# Patient Record
Sex: Female | Born: 1990 | Race: White | Hispanic: No | Marital: Married | Smoking: Never smoker
Health system: Southern US, Community
[De-identification: ages and names within clinical notes are randomized; demographics above are authoritative.]

## PROBLEM LIST (undated history)

## (undated) ENCOUNTER — Ambulatory Visit: Admission: EM | Payer: BC Managed Care – PPO

## (undated) DIAGNOSIS — O24419 Gestational diabetes mellitus in pregnancy, unspecified control: Secondary | ICD-10-CM

## (undated) DIAGNOSIS — T7840XA Allergy, unspecified, initial encounter: Secondary | ICD-10-CM

## (undated) DIAGNOSIS — D649 Anemia, unspecified: Secondary | ICD-10-CM

## (undated) DIAGNOSIS — H53009 Unspecified amblyopia, unspecified eye: Secondary | ICD-10-CM

## (undated) DIAGNOSIS — F419 Anxiety disorder, unspecified: Secondary | ICD-10-CM

## (undated) DIAGNOSIS — F32A Depression, unspecified: Secondary | ICD-10-CM

## (undated) DIAGNOSIS — Z8669 Personal history of other diseases of the nervous system and sense organs: Secondary | ICD-10-CM

## (undated) DIAGNOSIS — L709 Acne, unspecified: Secondary | ICD-10-CM

## (undated) DIAGNOSIS — G43909 Migraine, unspecified, not intractable, without status migrainosus: Secondary | ICD-10-CM

## (undated) DIAGNOSIS — Z87442 Personal history of urinary calculi: Secondary | ICD-10-CM

## (undated) DIAGNOSIS — F329 Major depressive disorder, single episode, unspecified: Secondary | ICD-10-CM

## (undated) DIAGNOSIS — N200 Calculus of kidney: Secondary | ICD-10-CM

## (undated) HISTORY — DX: Depression, unspecified: F32.A

## (undated) HISTORY — PX: WISDOM TOOTH EXTRACTION: SHX21

## (undated) HISTORY — DX: Acne, unspecified: L70.9

## (undated) HISTORY — DX: Anemia, unspecified: D64.9

## (undated) HISTORY — DX: Gestational diabetes mellitus in pregnancy, unspecified control: O24.419

## (undated) HISTORY — DX: Major depressive disorder, single episode, unspecified: F32.9

## (undated) HISTORY — DX: Allergy, unspecified, initial encounter: T78.40XA

## (undated) HISTORY — DX: Unspecified amblyopia, unspecified eye: H53.009

## (undated) HISTORY — DX: Migraine, unspecified, not intractable, without status migrainosus: G43.909

---
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
## 2011-01-07 NOTE — Consult Note (Signed)
Summary: [facility]  [facility]   Imported By: Maryln Gottron 01/01/2010 10:00:07  _____________________________________________________________________  External Attachment:    Type:   Image     Comment:   External Document

## 2011-02-17 ENCOUNTER — Telehealth: Payer: Self-pay | Admitting: Internal Medicine

## 2011-02-17 ENCOUNTER — Encounter: Payer: Self-pay | Admitting: Internal Medicine

## 2011-02-17 ENCOUNTER — Ambulatory Visit (INDEPENDENT_AMBULATORY_CARE_PROVIDER_SITE_OTHER): Payer: 59 | Admitting: Internal Medicine

## 2011-02-17 VITALS — BP 110/70 | HR 92 | Temp 97.2°F | Wt 144.0 lb

## 2011-02-17 DIAGNOSIS — J040 Acute laryngitis: Secondary | ICD-10-CM

## 2011-02-17 DIAGNOSIS — R059 Cough, unspecified: Secondary | ICD-10-CM

## 2011-02-17 DIAGNOSIS — R05 Cough: Secondary | ICD-10-CM

## 2011-02-17 MED ORDER — AZITHROMYCIN 250 MG PO TABS: 250.0000 mg | ORAL_TABLET | Freq: Every day | ORAL | Status: DC

## 2011-02-17 NOTE — Patient Instructions (Addendum)
take antibioitc  As directed  May return  to work on Monday after antibiotic  Schedule check up for forms and pre college physical

## 2011-02-17 NOTE — Telephone Encounter (Signed)
Mom calls in today stating that her daughter has a fever and congestion. Mom was treated for pertussis this Sunday. Please advise.

## 2011-02-17 NOTE — Telephone Encounter (Signed)
Pt works at a day care  With fever (?) sore throat and coughing  Mom was diagnosed with Pertussis.  Wants her seen today.Not feeling well x 24 hours.  Day care sent her home. [phone]

## 2011-02-17 NOTE — Telephone Encounter (Signed)
Per Dr. Fabian Sharp- Have pt come in now. Mom aware and will come in to check her in. She will tell the pt to come straight on back and not check in up front.

## 2011-02-18 ENCOUNTER — Encounter: Payer: Self-pay | Admitting: Internal Medicine

## 2011-02-18 NOTE — Progress Notes (Signed)
  Subjective:    Patient ID: Cheryl Galvan, female    DOB: 1991/05/24, 20 y.o.   MRN: [id number]  HPI Patient comes in today asking for an acute work in appointment by her mother's call because she had the onset of a sore throat laryngitis hoarseness and some coughing.   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 Her mother states that she was treated personally for pertussis by Dr. Merla Riches. I spoke with mother personally on the phone and she stated that she had a coughing illness that began almost 2 months ago and went through a couple rounds of antibiotics. Because her cough with so spasmodic Dr. Merla Riches treated her with azithromycin for possible pertussis. There was no culture or confirmatory test done. 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   [place] became sick over the last couple days with no associated fever she is not that sick but the daycare did send her home.  She does not smoke and has no hacking cough. [place] appears to be up to date on her tdap   She also asks about filling out a form for college immunization as she plans to transfer from [employer] to you when [employer] next semester. Review of Systems No chest pain shortness of breath significant headache nausea vomiting diarrhea G.I. GU symptoms or brash. She has no current asthma flare and has not taken a medicine for this in quite a while.    Objective:   Physical Exam This is a well-developed well-nourished healthy appearing young adult in no acute distress with some moderate hoarseness.  HEENT: Normocephalic ;atraumatic , Eyes;  PERRL, EOMs  Full, lids and conjunctiva clear,,Ears: no deformities, canals nl, TM landmarks normal, Nose: no deformity  There is mild congestion no discharge seen Mouth : OP clear without lesion or edema . Neck: no masses or adenopathy Chest:  Clear to A&P without wheezes rales or  rhonchi CV:  S1-S2 no gallops or murmurs peripheral perfusion is normal Abdomen:  Sof,t normal bowel sounds without hepatosplenomegaly, no guarding rebound or masses no CVA tenderness  LN: no cervical adenopathy   Assessment & Plan:  Cough and laryngitis  Appears to be viral but because of moms reported severe illness and the facgt that she works in day care we will empirically rx with azithro and note for work Will have her come back for wellness and visit for college matriculation

## 2011-04-21 ENCOUNTER — Ambulatory Visit (INDEPENDENT_AMBULATORY_CARE_PROVIDER_SITE_OTHER): Payer: 59 | Admitting: Internal Medicine

## 2011-04-21 ENCOUNTER — Encounter: Payer: Self-pay | Admitting: Internal Medicine

## 2011-04-21 VITALS — BP 120/80 | HR 78 | Ht 66.0 in | Wt 146.0 lb

## 2011-04-21 DIAGNOSIS — Z23 Encounter for immunization: Secondary | ICD-10-CM

## 2011-04-21 DIAGNOSIS — J45909 Unspecified asthma, uncomplicated: Secondary | ICD-10-CM

## 2011-04-21 DIAGNOSIS — Z0000 Encounter for general adult medical examination without abnormal findings: Secondary | ICD-10-CM | POA: Insufficient documentation

## 2011-04-21 MED ORDER — ALBUTEROL SULFATE HFA 108 (90 BASE) MCG/ACT IN AERS: INHALATION_SPRAY | RESPIRATORY_TRACT | Status: DC

## 2011-04-21 NOTE — Patient Instructions (Signed)
Try inhaler pre exercise  If needed. Can do anemia check HG test at next  Shot scheduled.

## 2011-04-21 NOTE — Progress Notes (Signed)
  Subjective:    Patient ID: Cheryl Galvan, female    DOB: 17-Jun-1991, 20 y.o.   MRN: [id number]  HPI Patient comes in today for a checkup and immunization form that is needed for entry into [facility]. Since her last visit she has had no major changes in her health status. Asthma  : When younger. However she has taken up running again and has had some problem with running recently. Heaviness or burning in chest question cough. Neg fam hx of heart disease.  OCps per   Beth lane  who checks her once a year.  She is doing well with her periods at this point. No major injuries or hospitalizations. 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 Review of Systems 12 system review negative except as above  Past history family history social history reviewed in the electronic medical record.     Objective:   Physical Exam Physical Exam: Vital signs reviewed WUJ:WJXB is a well-developed well-nourished alert cooperative  white female who appears her stated age in no acute distress.  HEENT: normocephalic  traumatic , Eyes: PERRL EOM's full, conjunctiva clear, Nares: paten,t no deformity discharge or tenderness., Ears: no deformity EAC's clear TMs with normal landmarks. Mouth: clear OP, no lesions, edema.  Moist mucous membranes. Dentition in adequate repair. NECK: supple without masses, thyromegaly or bruits. CHEST/PULM:  Clear to auscultation and percussion breath sounds equal no wheeze , rales or rhonchi. No chest wall deformities or tenderness. CV: PMI is nondisplaced, S1 S2 no gallops, murmurs, rubs. Peripheral pulses are full without delay.No JVD .  ABDOMEN: Bowel sounds normal nontender  No guard or rebound, no hepato splenomegal no CVA tenderness.  No hernia. Extremtities:  No clubbing cyanosis or edema, no acute joint swelling or redness no focal atrophy Back    Minimal scoliosis   Ortho screen negative  NEURO:   Oriented x3, cranial nerves 3-12 appear to be intact, no obvious focal weakness,gait within normal limits no abnormal reflexes or asymmetrical SKIN: No acute rashes normal turgor, color, no bruising or petechiae. PSYCH: Oriented, good eye contact, no obvious depression anxiety, cognition and judgment appear normal. Breast: normal by inspection . No dimpling, discharge, masses, tenderness or discharge . LN: no cervical axillary inguinal adenopathy Offered lab    Declined may get hg at next immuniz Had labs done a few years ago at [facility].        Assessment & Plan:  Preventive Health Care Counseled regarding healthy nutrition, exercise, sleep, injury prevention, calcium vit d and healthy weight . Recommended immunizations discussed and explained. Questions answered.  No evidence of hep b so give a b series  Hx of asthma : poss EIA      Disc  options will give rx inhaler and disc use   Pre exercise .  To check  If progressive sx.

## 2011-04-21 NOTE — Assessment & Plan Note (Signed)
Possible exercise-induced symptoms recently discussed use of inhaler before exercise as a trial.

## 2011-05-24 ENCOUNTER — Ambulatory Visit (INDEPENDENT_AMBULATORY_CARE_PROVIDER_SITE_OTHER): Payer: 59 | Admitting: Internal Medicine

## 2011-05-24 DIAGNOSIS — Z23 Encounter for immunization: Secondary | ICD-10-CM

## 2011-10-27 ENCOUNTER — Ambulatory Visit (INDEPENDENT_AMBULATORY_CARE_PROVIDER_SITE_OTHER): Payer: 59 | Admitting: Internal Medicine

## 2011-10-27 DIAGNOSIS — Z23 Encounter for immunization: Secondary | ICD-10-CM

## 2012-05-20 ENCOUNTER — Ambulatory Visit (INDEPENDENT_AMBULATORY_CARE_PROVIDER_SITE_OTHER): Payer: 59 | Admitting: Internal Medicine

## 2012-05-20 ENCOUNTER — Ambulatory Visit: Payer: 59

## 2012-05-20 ENCOUNTER — Other Ambulatory Visit: Payer: Self-pay | Admitting: Internal Medicine

## 2012-05-20 VITALS — BP 107/71 | HR 84 | Temp 99.4°F | Resp 20 | Ht 66.0 in | Wt 163.0 lb

## 2012-05-20 DIAGNOSIS — R5383 Other fatigue: Secondary | ICD-10-CM

## 2012-05-20 DIAGNOSIS — R5381 Other malaise: Secondary | ICD-10-CM

## 2012-05-20 DIAGNOSIS — M542 Cervicalgia: Secondary | ICD-10-CM

## 2012-05-20 DIAGNOSIS — R519 Headache, unspecified: Secondary | ICD-10-CM

## 2012-05-20 DIAGNOSIS — M940 Chondrocostal junction syndrome [Tietze]: Secondary | ICD-10-CM

## 2012-05-20 DIAGNOSIS — L739 Follicular disorder, unspecified: Secondary | ICD-10-CM

## 2012-05-20 DIAGNOSIS — D649 Anemia, unspecified: Secondary | ICD-10-CM

## 2012-05-20 DIAGNOSIS — R51 Headache: Secondary | ICD-10-CM

## 2012-05-20 DIAGNOSIS — R509 Fever, unspecified: Secondary | ICD-10-CM

## 2012-05-20 LAB — COMPREHENSIVE METABOLIC PANEL
ALT: <8 U/L (ref 0–35)
AST: 9 U/L (ref 0–37)
Albumin: 4.3 g/dL (ref 3.5–5.2)
Alkaline Phosphatase: 65 U/L (ref 39–117)
BUN: 10 mg/dL (ref 6–23)
CO2: 25 mEq/L (ref 19–32)
Calcium: 9.8 mg/dL (ref 8.4–10.5)
Chloride: 107 mEq/L (ref 96–112)
Creat: 0.71 mg/dL (ref 0.50–1.10)
Glucose, Bld: 87 mg/dL (ref 70–99)
Potassium: 4.5 mEq/L (ref 3.5–5.3)
Sodium: 141 mEq/L (ref 135–145)
Total Bilirubin: 0.3 mg/dL (ref 0.3–1.2)
Total Protein: 7.2 g/dL (ref 6.0–8.3)

## 2012-05-20 LAB — POCT CBC
Granulocyte percent: 70.3 %G (ref 37–80)
HCT, POC: 37 % — AB (ref 37.7–47.9)
Hemoglobin: 11.8 g/dL — AB (ref 12.2–16.2)
Lymph, poc: 1.9 (ref 0.6–3.4)
MCH, POC: 28.2 pg (ref 27–31.2)
MCHC: 31.9 g/dL (ref 31.8–35.4)
MCV: 88.6 fL (ref 80–97)
MID (cbc): 0.4 (ref 0–0.9)
MPV: 10.2 fL (ref 0–99.8)
POC Granulocyte: 5.5 (ref 2–6.9)
POC LYMPH PERCENT: 24.4 %L (ref 10–50)
POC MID %: 5.3 %M (ref 0–12)
Platelet Count, POC: 317 10*3/uL (ref 142–424)
RBC: 4.18 M/uL (ref 4.04–5.48)
RDW, POC: 13.3 %
WBC: 7.8 10*3/uL (ref 4.6–10.2)

## 2012-05-20 LAB — POCT SEDIMENTATION RATE: POCT SED RATE: 20 mm/hr (ref 0–22)

## 2012-05-20 LAB — TSH: TSH: 0.965 u[IU]/mL (ref 0.350–4.500)

## 2012-05-20 MED ORDER — MELOXICAM 15 MG PO TABS: 15.0000 mg | ORAL_TABLET | Freq: Every day | ORAL | Status: DC

## 2012-05-20 MED ORDER — DOXYCYCLINE HYCLATE 100 MG PO TABS: 100.0000 mg | ORAL_TABLET | Freq: Two times a day (BID) | ORAL | Status: AC

## 2012-05-20 NOTE — Progress Notes (Signed)
Subjective:    Patient ID: Cheryl Galvan, female    DOB: 1991/09/12, 21 y.o.   MRN: [id number]  HPIComplaining of continued chest wall pain and neck pain for 6-8 weeks Has not responded to treatment with Flexeril and ibuprofen over the last week from Dr. Ernestina Penna There is no history of injury. This started soon after an episode sore throat. She thinks she has had intermittent fever, night sweats, and hot flashes during the day. Her neck range of motion is restricted by pain and frequently wakes her at night. She is very fatigued. She Remembers no tick bite/No rash except about 8 weeks ago she had an itchy red lesion on her left lower leg which was surrounded by pustules. 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    Review of Systems  Constitutional: Negative for unexpected weight change.  HENT: Negative for ear pain, mouth sores, trouble swallowing, dental problem and ear discharge.   Eyes: Negative for photophobia, discharge and itching.  Respiratory: Negative for cough and shortness of breath.   Cardiovascular: Negative for palpitations.  Gastrointestinal: Positive for nausea. Negative for vomiting, abdominal pain and diarrhea.       She has noticed nausea when she lies down on an intermittent basis but no vomiting and no problems eating except for a diminished appetite  Genitourinary: Negative for dysuria, frequency, vaginal discharge, difficulty urinating, genital sores and menstrual problem.  Musculoskeletal: Negative for myalgias, joint swelling and gait problem.       She has a history of lumbar spondylolisthesis treated by rheumatology at [facility] however evaluation by Dr. Yevette Edwards in 2011 confirmed that there was no spondylolisthesis in the lumbar area.  Skin: Positive for pallor.       Mom notes facial pallor  Neurological:  Positive for headaches.       She complains of mild overall headaches that don't cause vision changes or nausea or vomiting or dizziness  Hematological: Negative for adenopathy. Does not bruise/bleed easily.  Psychiatric/Behavioral: Negative for dysphoric mood.      [facility] reading (PRIMARY) by  Dr. Dawnette Mione=Mild degenerative changes of the C-spine with loss of normal lordosis/mild       Chest x-ray within normal limits  Results for orders placed in visit on 05/20/12  POCT CBC      Component Value Range   WBC 7.8  4.6 - 10.2 K/uL   Lymph, poc 1.9  0.6 - 3.4   POC LYMPH PERCENT 24.4  10 - 50 %L   MID (cbc) 0.4  0 - 0.9   POC MID % 5.3  0 - 12 %M   POC Granulocyte 5.5  2 - 6.9   Granulocyte percent 70.3  37 - 80 %G   RBC 4.18  4.04 - 5.48 M/uL   Hemoglobin 11.8 (*) 12.2 - 16.2 g/dL   HCT, POC 41.3 (*) 24.4 - 47.9 %   MCV 88.6  80 - 97 fL   MCH, POC 28.2  27 - 31.2 pg   MCHC 31.9  31.8 - 35.4 g/dL   RDW, POC 01.0     Platelet Count, POC 317  142 - 424 K/uL   MPV 10.2  0 - 99.8 fL    Objective:   Physical ExamVital signs are stable In no acute distress/well-nourished/mild facial pallor Conjunctiva clear/TMs  clear/nares clear/oral pharynx clear No anterior or posterior cervical nodes No thyromegaly or nodules Lungs clear to auscultation She is very tender in the costosternal joints 345 and 6 bilaterally without redness or swelling Pain in the chest wall increases with shoulder abduction and 90 on either side/shoulder exam otherwise normal The neck has pain with all ranges of motion except flexion/she is tender over the posterior cervical muscles in both trapezii without redness The heart is regular without murmurs rubs or gallops There is no other joint tenderness  the skin is clear exceptFor 5 healing lesions on the lower extremities that are mildly hyperpigmented and flaky and are centered around hair follicles Cranial nerves intact/no sensory or motor losses          Assessment & Plan:   1. Fatigue  POCT CBC, POCT SEDIMENTATION RATE, Epstein-Barr virus VCA antibody panel, Comprehensive metabolic panel, TSH, DG Chest 2 View, DG Cervical Spine 2-3 Views  2. Headache, Secondary   3. Costochondritis  Change ibuprofen to Mobic 15 mg daily  4. Nonspecific pain in the neck region  Increase Flexeril 10 mg at bedtime  5. Anemia  Iron and TIBC  6. Fever  Check temperature at home  7. Folliculitis  Since this seems to be spreading and she shaves her legs she will take doxycycline 100 mg 3 times a day for 10 days and refrain from shaving for one week scrubbing these lesions when she bathes  With 2 major areas that have objective findings of joint tenderness coupled with fatigue and anemia it is likely to be a secondary reaction to some type of infection, And hopefully self-limiting. I will call her with lab results in 48 hours And add to the current plan She will followup in 7-10 days if not dramatically improved for further evaluation   copy note to Dr. Ernestina Penna

## 2012-05-22 LAB — EPSTEIN-BARR VIRUS VCA ANTIBODY PANEL
EBV EA IgG: <5 U/mL (ref <9.0)
EBV NA IgG: 347 U/mL — ABNORMAL HIGH (ref <18.0)
EBV VCA IgG: 434 U/mL — ABNORMAL HIGH (ref <18.0)
EBV VCA IgM: <10 U/mL (ref <36.0)

## 2012-05-23 ENCOUNTER — Telehealth: Payer: Self-pay

## 2012-05-23 NOTE — Telephone Encounter (Signed)
MOTHER WANTS TO KNOW IF HER DAUGHTERS LAB RESULTS ARE BACK.   SHE LEFT HER WORK NUMBER AND SAYS THAT EVEN IF SHE IS IN A ROOM WITH A PATIENT, THAT THEY WILL PULL HER FROM THE ROOM FOR THE CALL.

## 2012-05-24 NOTE — Telephone Encounter (Signed)
Dr. Merla Riches, Please review the labs and advise how you'd like [place] to respond.  Thanks.

## 2012-05-24 NOTE — Telephone Encounter (Signed)
Plan noted that her labs were all normal including the thyroid in the liver and at the mono studies showed that she in fact has had mononucleosis This lab cannot tell us whether her current illness is all due to mononucleosis, only that she's had the infection in the past. These labs could look just like this if she had mono one month ago and it was taking a long time to get over it Tell mom that I would like to recheck Brittney next week on Tuesday

## 2012-05-25 ENCOUNTER — Encounter: Payer: Self-pay | Admitting: Internal Medicine

## 2012-05-25 ENCOUNTER — Telehealth: Payer: Self-pay

## 2012-05-25 ENCOUNTER — Ambulatory Visit (INDEPENDENT_AMBULATORY_CARE_PROVIDER_SITE_OTHER): Payer: 59 | Admitting: Emergency Medicine

## 2012-05-25 ENCOUNTER — Telehealth: Payer: Self-pay | Admitting: Family Medicine

## 2012-05-25 VITALS — BP 120/78 | HR 87 | Temp 97.4°F | Resp 16 | Ht 66.0 in | Wt 165.0 lb

## 2012-05-25 DIAGNOSIS — R51 Headache: Secondary | ICD-10-CM

## 2012-05-25 LAB — IRON AND TIBC
Iron: 65 ug/dL (ref 42–145)
TIBC: 415 ug/dL (ref 250–470)
UIBC: 350 ug/dL (ref 125–400)

## 2012-05-25 NOTE — Telephone Encounter (Signed)
Spoke with patient-- still having HA.  Dr. Merla Riches and I spoke and he advised that patient RTC for neuro eval and see if CT or other workup is needed.  Patient notified and will recheck with Dr. Cleta Alberts.

## 2012-05-25 NOTE — Telephone Encounter (Signed)
Patients mother called saying her daughter is still having the headache with chronic pain.  This patient saw Merla Riches recently and the medicine is not helping the pain at all.  Mother wonders if she needs a CT scan.  Please call, work number is [phone] and the cell phone is [phone]

## 2012-05-25 NOTE — Telephone Encounter (Signed)
Patient notified below, HA still there, so she will followup with Dr. Cleta Alberts today for eval.

## 2012-05-25 NOTE — Telephone Encounter (Signed)
Message copied by Eddie Candle on Thu May 25, 2012 10:24 AM ------      Message from: Tonye Pearson      Created: Thu May 25, 2012 12:13 AM       ????      ----- Message -----         From: SYSTEM         Sent: 05/25/2012  12:02 AM           To: Tonye Pearson, MD

## 2012-05-25 NOTE — Progress Notes (Signed)
  Subjective:    Patient ID: Cheryl Galvan, female    DOB: [birth year], 20 y.o.   MRN: [id number]  HPI patient enters with persistent problems with headache. She has a follow headache and pain around the back of her head she saw Dr. Peri Jefferson will have routine labs done which were normal. Started on doxycycline to cover her for acute diseases. She persisted in having significant severe headache she is unable to get relief from the    Review of Systems     Objective:   Physical Exam  Constitutional: She is oriented to person, place, and time. She appears well-developed and well-nourished.  Eyes: Pupils are equal, round, and reactive to light.  Neck: Neck supple.  Neurological: She is alert and oriented to person, place, and time. She has normal reflexes. No cranial nerve deficit. Coordination normal.  Psychiatric: She has a normal mood and affect. Her behavior is normal.          Assessment & Plan:  Patient with a persistent headache now for one month. We'll check CT of the head. If her CT is normal will refer to headache center or to neurology for further treatment .

## 2012-05-25 NOTE — Telephone Encounter (Signed)
I had spoke with patient prior in regards to mono--LMOM for her to call back today so we can advise followup.

## 2012-05-25 NOTE — Telephone Encounter (Signed)
The TIBC and Fe was not clinic collected. Called [place] spoke with Tresa Endo and they never got order for TIBC and Fe since it wasn't clinic collected but they still have specimen so it was added today.

## 2012-05-26 ENCOUNTER — Other Ambulatory Visit: Payer: 59

## 2012-05-26 ENCOUNTER — Ambulatory Visit
Admission: RE | Admit: 2012-05-26 | Discharge: 2012-05-26 | Disposition: A | Discharge status: Home / Self Care | Payer: 59 | Source: Ambulatory Visit | Attending: Emergency Medicine | Admitting: Emergency Medicine

## 2012-05-26 DIAGNOSIS — R51 Headache: Secondary | ICD-10-CM

## 2012-05-27 ENCOUNTER — Telehealth: Payer: Self-pay | Admitting: Emergency Medicine

## 2012-05-27 DIAGNOSIS — R519 Headache, unspecified: Secondary | ICD-10-CM

## 2012-05-29 NOTE — Telephone Encounter (Signed)
Gave pt results of CT and pt wanted to know if we are going to refer her to specialist. Checked w/Dr Cleta Alberts and he gave VO for referral to HA clinic. Pt aware she will be contacted when appt is set up.

## 2012-09-07 ENCOUNTER — Encounter: Payer: Self-pay | Admitting: Family Medicine

## 2012-09-07 ENCOUNTER — Ambulatory Visit (INDEPENDENT_AMBULATORY_CARE_PROVIDER_SITE_OTHER): Payer: 59 | Admitting: Family Medicine

## 2012-09-07 VITALS — BP 130/62 | Temp 98.7°F | Wt 164.0 lb

## 2012-09-07 DIAGNOSIS — J069 Acute upper respiratory infection, unspecified: Secondary | ICD-10-CM

## 2012-09-07 LAB — POCT RAPID STREP A (OFFICE): Rapid Strep A Screen: NEGATIVE

## 2012-09-07 MED ORDER — BENZONATATE 200 MG PO CAPS: 200.0000 mg | ORAL_CAPSULE | Freq: Three times a day (TID) | ORAL | Status: DC | PRN

## 2012-09-07 NOTE — Progress Notes (Signed)
  Subjective:    Patient ID: Cheryl Galvan, female    DOB: 1991-04-09, 21 y.o.   MRN: [id number]  HPI  3-4 day history of body aches, malaise, dry cough, sore throat, and nasal congestion. She works in a Occupational psychologist. She's taken Aleve with minimal relief. Her cough is especially bothersome. No wheezing. Nonsmoker. No dyspnea. Denies any nausea or vomiting.   Review of Systems  Constitutional: Negative for fever and chills.  HENT: Positive for congestion.   Respiratory: Positive for cough. Negative for shortness of breath and wheezing.   Cardiovascular: Negative for chest pain.  Gastrointestinal: Negative for nausea and vomiting.  Hematological: Negative for adenopathy.       Objective:   Physical Exam  Constitutional: She appears well-developed and well-nourished. No distress.  HENT:  Right Ear: External ear normal.  Left Ear: External ear normal.  Mouth/Throat: Oropharynx is clear and moist.  Neck: Neck supple.  Cardiovascular: Normal rate and regular rhythm.   Pulmonary/Chest: Effort normal and breath sounds normal. No respiratory distress. She has no wheezes. She has no rales.  Lymphadenopathy:    She has no cervical adenopathy.  Skin: No rash noted.          Assessment & Plan:  URI. Suspect viral. Tessalon Perles 200 mg every 8 hours as needed for cough. Followup as needed  Rapid strep negative

## 2012-09-07 NOTE — Patient Instructions (Addendum)

## 2013-01-02 ENCOUNTER — Ambulatory Visit (INDEPENDENT_AMBULATORY_CARE_PROVIDER_SITE_OTHER): Payer: 59 | Admitting: Internal Medicine

## 2013-01-02 ENCOUNTER — Encounter: Payer: Self-pay | Admitting: Internal Medicine

## 2013-01-02 VITALS — BP 120/80 | HR 96 | Temp 98.5°F | Wt 168.0 lb

## 2013-01-02 DIAGNOSIS — J22 Unspecified acute lower respiratory infection: Secondary | ICD-10-CM

## 2013-01-02 DIAGNOSIS — R110 Nausea: Secondary | ICD-10-CM

## 2013-01-02 DIAGNOSIS — J988 Other specified respiratory disorders: Secondary | ICD-10-CM

## 2013-01-02 DIAGNOSIS — R1031 Right lower quadrant pain: Secondary | ICD-10-CM

## 2013-01-02 LAB — POCT URINALYSIS DIPSTICK
Ketones, UA: NEGATIVE
Leukocytes, UA: NEGATIVE
Nitrite, UA: NEGATIVE
Protein, UA: NEGATIVE
Urobilinogen, UA: 0.2
pH, UA: 6

## 2013-01-02 NOTE — Patient Instructions (Addendum)
You probably have a gastritis related to the amount of  the medication you are taking.  stp the aleve  And pepto medication for now . Take prilosec each day for about a week.   Can use an antinausea medication if needed. Contact us if getting more abd pain right side or vomiting or fever etc.   Note for work.

## 2013-01-02 NOTE — Progress Notes (Signed)
Chief Complaint  Patient presents with  . Cough    Treating with benzonatate and Aleve.  Started 1 week ago and has progressively gotten worse.  Has had low grade fevers.  . Headache  . Diarrhea  . Nausea  . Nasal Congestion  . Generalized Body Aches  . Sore Throat  . Fever    HPI: Patient comes in today for  new problem evaluation. Onset about a   Week  Cough and headache  Has had nausea   But now worse and had dry heaving yesterday.   Could feel up to throat and wouldn't  come out. Low grade 99.5 yesterday  Diarrhea 3 days ago and took pepto.   Candy.   Nasal congestion. And then  thorat scratchy like not like strep. Cough getting better no sob . No wheezing . 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   Meds:  ALEVE every 8 hours since 2 days ago.    pepto a lot   tessalon twice .   Upper abd ball feelling .  Tense stomach . Period due this week denies risk of pregnancy.  Needs note for work .  ROS: See pertinent positives and negatives per HPI. No bleeding rashes   Past Medical History  Diagnosis Date  . Acne   . Asthma      no recent difficulties   . Allergy     sesonal allergy   . Depression     Family History  Problem Relation Age of Onset  . Allergies    . Anemia    . Asthma      History   Social History  . Marital Status: Married    Spouse Name: N/A    Number of Children: N/A  . Years of Education: N/A   Social History Main Topics  . Smoking status: Never Smoker   . Smokeless tobacco: None  . Alcohol Use: None  . Drug Use: None  . Sexually Active: None   Other Topics Concern  . None   Social History Narrative   Household of three cats and dogs[employer] early [employer]   25 hours  To go to [employer] on campus.No ets     Outpatient Encounter Prescriptions as of 01/02/2013  Medication Sig Dispense Refill  . albuterol (VENTOLIN HFA) 108 (90 BASE) MCG/ACT inhaler 2 puffs 30   Minutes pre exercise  For exercise induced wheezing  1 Inhaler  1  . benzonatate (TESSALON) 200 MG capsule Take 1 capsule (200 mg total) by mouth 3 (three) times daily as needed for cough.  30 capsule  0  . Norgestim-Eth Estrad Triphasic (ORTHO TRI-CYCLEN LO) 0.18/0.215/0.25 MG-25 MCG TABS Take by mouth.        Marland Kitchen ibuprofen (ADVIL,MOTRIN) 800 MG tablet Take 800 mg by mouth every 8 (eight) hours as needed.      . [DISCONTINUED] meloxicam (MOBIC) 15 MG tablet Take 1 tablet (15 mg total) by mouth daily.  30 tablet  0  . [DISCONTINUED] pantoprazole (PROTONIX) 20 MG tablet Take 20 mg by mouth daily.      lmp   No risk of preg    EXAM:  BP 120/80  Pulse 96  Temp 98.5 F (36.9 C) (Oral)  Wt 168 lb (76.204 kg)  SpO2 99%  LMP 12/05/2012  There is no height on file to calculate BMI.  GENERAL: vitals reviewed and listed above, alert, oriented, appears well hydrated and  in no acute distress Upper congestion non toxic  Skin: normal capillary refill ,turgor , color: No acute rashes ,petechiae or bruising  HEENT: atraumatic, conjunctiva  clear, no obvious abnormalities on inspection of external nose and ears  No face tenderness tms in tact  midl nasal congestionOP : no lesion edema or exudate   NECK: no obvious masses on inspection palpation  suplle no adenopathy  LUNGS: clear to auscultation bilaterally, no wheezes, rales or rhonchi, good air movement  CV: HRRR, no clubbing cyanosis or  peripheral edema nl cap refill  Abdomen:  Sof,t normal bowel sounds without hepatosplenomegaly, no guarding rebound or masses no CVA tenderness tender local area RLQ but no g or r   No psoas sign   MS: moves all extremities without noticeable focal  abnormality  PSYCH: pleasant and cooperative, no obvious depression or anxiety UA clear ucg negative  ASSESSMENT AND PLAN:  Discussed the following assessment and plan:  1. Nausea  POCT urinalysis dipstick, POCT urine pregnancy   poss secondary to illness and  meds taken hsaid and pepto   2. Acute respiratory infection     seems viral .   3. Abdominal discomfort in right lower quadrant     observe carefully fro progression fever alarm features     -Patient advised to return or notify health care team  if symptoms worsen or persist or new concerns arise.  Patient Instructions  You probably have a gastritis related to the amount of  the medication you are taking.  stp the aleve  And pepto medication for now . Take prilosec each day for about a week.   Can use an antinausea medication if needed. Contact us if getting more abd pain right side or vomiting or fever etc.   Note for work.    Neta Mends. Dellia Donnelly M.D.

## 2013-09-25 ENCOUNTER — Ambulatory Visit (INDEPENDENT_AMBULATORY_CARE_PROVIDER_SITE_OTHER): Payer: 59 | Admitting: Family Medicine

## 2013-09-25 ENCOUNTER — Ambulatory Visit: Payer: 59

## 2013-09-25 VITALS — BP 110/70 | HR 88 | Temp 98.3°F | Resp 16 | Ht 66.0 in | Wt 180.0 lb

## 2013-09-25 DIAGNOSIS — M79671 Pain in right foot: Secondary | ICD-10-CM

## 2013-09-25 DIAGNOSIS — M79609 Pain in unspecified limb: Secondary | ICD-10-CM

## 2013-09-25 DIAGNOSIS — W108XXA Fall (on) (from) other stairs and steps, initial encounter: Secondary | ICD-10-CM

## 2013-09-25 NOTE — Patient Instructions (Signed)
Keep your wounds covered and clean.  If you are not better in the next few days please let me know- Sooner if worse.   Ice and elevate your foot and leg.  Try some ibuprofen as needed for pain

## 2013-09-25 NOTE — Progress Notes (Signed)
[facility] [address], [place] [address] [phone]  Date:  09/25/2013   Name:  Nya Monds   DOB:  Jun 21, 1991   MRN:  [id number]  PCP:  Lorretta Harp, MD    Chief Complaint: Foot Injury   History of Present Illness:  Vicenta Olds is a 22 y.o. very pleasant female patient who presents with the following:  She feel down about 5 stairs this morning.  They were outdoor concrete stairs and scraped/ contused her right leg.  Except for her leg she did not otherwise get hurt.    No head injury.   She is otherwise healthy.   She is able to walk but her foot hurts.  Her ankle hurts a bit as well Last tetanus shot was less than 10 years ago No chance of pregnancy at this time Here today with her husband, she is on OCP.    Patient Active Problem List   Diagnosis Date Noted  . Nausea 01/02/2013  . Abdominal discomfort in right lower quadrant 01/02/2013  . Routine general medical examination at a health care facility 04/21/2011  . DEPRESSION 08/15/2007  . ASTHMA 08/15/2007    Past Medical History  Diagnosis Date  . Acne   . Asthma      no recent difficulties   . Allergy     sesonal allergy   . Depression     History reviewed. No pertinent past surgical history.  History  Substance Use Topics  . Smoking status: Never Smoker   . Smokeless tobacco: Not on file  . Alcohol Use: Not on file    Family History  Problem Relation Age of Onset  . Allergies    . Anemia    . Asthma      No Known Allergies  Medication list has been reviewed and updated.  Current Outpatient Prescriptions on File Prior to Visit  Medication Sig Dispense Refill  . albuterol (VENTOLIN HFA) 108 (90 BASE) MCG/ACT inhaler 2 puffs 30  Minutes pre exercise  For exercise induced wheezing  1 Inhaler  1  . ibuprofen (ADVIL,MOTRIN) 800 MG tablet Take 800 mg by mouth every 8 (eight) hours as needed.      Lorita Officer Triphasic (ORTHO  TRI-CYCLEN LO) 0.18/0.215/0.25 MG-25 MCG TABS Take by mouth.         No current facility-administered medications on file prior to visit.    Review of Systems:  As per HPI- otherwise negative.  Physical Examination: Filed Vitals:   09/25/13 0922  BP: 110/70  Pulse: 88  Temp: 98.3 F (36.8 C)  Resp: 16   Filed Vitals:   09/25/13 0922  Height: 5\' 6"  (1.676 m)  Weight: 180 lb (81.647 kg)   Body mass index is 29.07 kg/(m^2). Ideal Body Weight: Weight in (lb) to have BMI = 25: 154.6  GEN: WDWN, NAD, Non-toxic, A & O x 3, looks well HEENT: Atraumatic, Normocephalic. Neck supple. No masses, No LAD. Ears and Nose: No external deformity. CV: RRR, No M/G/R. No JVD. No thrill. No extra heart sounds. PULM: CTA B, no wheezes, crackles, rhonchi. No retractions. No resp. distress. No accessory muscle use. EXTR: No c/c/e NEURO favoring right foot.  PSYCH: Normally interactive. Conversant. Not depressed or anxious appearing.  Calm demeanor.  Right foot: she has an abrasion over the mid foot, no laceration. No bruise or swelling. She is tender near the abraded area.  Normal circulation to toes, normal movement of toes.  Right ankle is negative- no tenderness to pressure on bones or ligaments, normal ROM There is an abrasion over the right shin as well.  No bony tenderness or swelling/ bruising to suggest more serious injury to lower leg.   Knee is negative except for very small abrasion  [facility] reading (PRIMARY) by  Dr. Patsy Lager. Right foot: negative for fracture or dislocatoin  RIGHT FOOT COMPLETE - 3+ VIEW  COMPARISON: None.  FINDINGS: The mineralization and alignment are normal. There is no evidence of acute fracture or dislocation. The joint spaces are maintained. No focal soft tissue swelling is evident.  IMPRESSION: No acute osseous findings.  Wounds cleaned with peroxide and dressed  Assessment and Plan: Fall down stairs, initial encounter  Pain in right foot - Plan: DG  Foot Complete Right  Contusions and abrasions, no evidence of fracture.  Went over wound care, crutches to use as needed.  If not better in the next few days she will let me know- Sooner if worse.     Signed Abbe Amsterdam, MD

## 2013-10-01 ENCOUNTER — Telehealth: Payer: Self-pay | Admitting: Internal Medicine

## 2013-10-01 NOTE — Telephone Encounter (Signed)
Pt following up on request to see another PCP. Pt states she has seen a specialist and had CTscan w/ no results.

## 2013-10-01 NOTE — Telephone Encounter (Signed)
I haven't seen her for  Ongoing problem   Just one acute visit last January. She may benefit from a specialist evaluation   Or certainly a regular OV to evaluate. Please  Get more information about what is going on so we can get her scheduled for appropriate appt.

## 2013-10-01 NOTE — Telephone Encounter (Signed)
Pt requesting to see another provider at [facility] office, other than PCP to get a second opinion.  Pt states she has been experiencing dizziness and nausea for over a year and no one can seem to determine what is wrong with her.  She states the episodes continue to get worse and would like to see if another provider would consider seeing her.

## 2013-10-02 ENCOUNTER — Other Ambulatory Visit: Payer: Self-pay | Admitting: Family Medicine

## 2013-10-02 DIAGNOSIS — R42 Dizziness and giddiness: Secondary | ICD-10-CM

## 2013-10-02 DIAGNOSIS — R519 Headache, unspecified: Secondary | ICD-10-CM

## 2013-10-02 DIAGNOSIS — R110 Nausea: Secondary | ICD-10-CM

## 2013-10-02 NOTE — Telephone Encounter (Signed)
Spoke to the pt.  She complains of headaches, dizziness and nausea ongoing for 1+ years.  Has seen a neurologist in the past but did not like the results and would like to see someone else.  Requested [facility].  Order placed in the system.

## 2013-10-03 ENCOUNTER — Ambulatory Visit (INDEPENDENT_AMBULATORY_CARE_PROVIDER_SITE_OTHER): Payer: 59 | Admitting: Diagnostic Neuroimaging

## 2013-10-03 ENCOUNTER — Encounter: Payer: Self-pay | Admitting: Diagnostic Neuroimaging

## 2013-10-03 VITALS — BP 127/73 | HR 85 | Ht 67.0 in | Wt 182.5 lb

## 2013-10-03 DIAGNOSIS — R5381 Other malaise: Secondary | ICD-10-CM

## 2013-10-03 DIAGNOSIS — G43109 Migraine with aura, not intractable, without status migrainosus: Secondary | ICD-10-CM

## 2013-10-03 MED ORDER — RIZATRIPTAN BENZOATE 10 MG PO TBDP: 10.0000 mg | ORAL_TABLET | ORAL | Status: DC | PRN

## 2013-10-03 MED ORDER — TOPIRAMATE 50 MG PO TABS: 50.0000 mg | ORAL_TABLET | Freq: Two times a day (BID) | ORAL | Status: DC

## 2013-10-03 MED ORDER — ONDANSETRON 8 MG PO TBDP: 8.0000 mg | ORAL_TABLET | Freq: Three times a day (TID) | ORAL | Status: DC | PRN

## 2013-10-03 NOTE — Patient Instructions (Signed)
Start topiramate 50 mg at bedtime. After one to 2 weeks increase to twice a day.  Use rizatriptan as needed for breakthrough migraine. May repeat times one after 2 hours. Maximum 2 tabs in 24 hours. Maximum 9 per month.  Use Zofran as needed for nausea.  I will check MRI of the brain.  I will check lab testing for fatigue evaluation.

## 2013-10-03 NOTE — Progress Notes (Signed)
[facility]  PATIENT: Cheryl Galvan DOB: 1991-09-19  REFERRING CLINICIAN: Panosh HISTORY FROM: patient, mother, husband REASON FOR VISIT: new consult   HISTORICAL  CHIEF COMPLAINT:  Chief Complaint  Patient presents with  . Pain    head, neck  . Dizziness    nausea, muffled hearing and inabilityto speak    HISTORY OF PRESENT ILLNESS:   22 year old right-handed female here for evaluation of headaches, nausea, inability to speak.  For past one year patient has had unilateral severe throbbing headaches, with neck pain, and dizziness, nausea, blurred vision. Sometimes she sees bright spots in front of her eyes with headaches. Patient is having 3-4 days of severe headaches per week.  Last week patient had an episode where she woke up at nighttime to go to the bathroom. She had severe nausea and dizziness. She called for help but was unable to get her words out. This lasted for 10-15 minutes. Eventually she was able to speak again, a glass of water go back to sleep.  Patient has been evaluated at headache Center and diagnosed with migraine headaches. She was tried on imipramine and Imitrex and Phenergan, with some improvement in headache frequency, but side effects of sleepiness and fatigue.   REVIEW OF SYSTEMS: Full 14 system review of systems performed and notable only for fatigue ringing in ears blurred vision eye pain and diarrhea feeling cold increased thirst aching muscles allergies runny nose decreased energy dizziness headache sleepiness.  ALLERGIES: No Known Allergies  HOME MEDICATIONS: Outpatient Prescriptions Prior to Visit  Medication Sig Dispense Refill  . albuterol (VENTOLIN HFA) 108 (90 BASE) MCG/ACT inhaler 2 puffs 30  Minutes pre exercise  For exercise induced wheezing  1 Inhaler  1  . ibuprofen (ADVIL,MOTRIN) 800 MG tablet Take 800 mg by mouth every 8 (eight) hours as needed.      Lorita Officer Triphasic (ORTHO TRI-CYCLEN  LO) 0.18/0.215/0.25 MG-25 MCG TABS Take by mouth.         No facility-administered medications prior to visit.    PAST MEDICAL HISTORY: Past Medical History  Diagnosis Date  . Acne   . Asthma      no recent difficulties   . Allergy     sesonal allergy   . Depression   . Amblyopia   . Migraine     PAST SURGICAL HISTORY: History reviewed. No pertinent past surgical history.  FAMILY HISTORY: Family History  Problem Relation Age of Onset  . Allergies    . Anemia    . Asthma      SOCIAL HISTORY:  History   Social History  . Marital Status: Married    Spouse Name: N/A    Number of Children: 0  . Years of Education: Assc.    Occupational History  .  Other    [employer]   Social History Main Topics  . Smoking status: Never Smoker   . Smokeless tobacco: Never Used  . Alcohol Use: Yes     Comment: occasionally; 1 drink weekly  . Drug Use: No  . Sexual Activity: Not on file   Other Topics Concern  . 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   No ets    Caffeine Use: 1 soda daily   Patient  lives at home with her family     PHYSICAL EXAM  Filed Vitals:   10/03/13 0831 10/03/13 0833  BP: 113/70 127/73  Pulse: 86 85  Height: 5\' 7"  (1.702 m)   Weight: 182 lb 8 oz (82.781 kg)     Not recorded    Body mass index is 28.58 kg/(m^2).  GENERAL EXAM: Patient is in no distress  CARDIOVASCULAR: Regular rate and rhythm, no murmurs, no carotid bruits  NEUROLOGIC: MENTAL STATUS: awake, alert, language fluent, comprehension intact, naming intact CRANIAL NERVE: no papilledema on fundoscopic exam, pupils equal and reactive to light, visual fields full to confrontation, VISUAL ACUITY (OD 2/20, OS 20/50). Extraocular muscles intact, no nystagmus, facial sensation and strength  symmetric, uvula midline, shoulder shrug symmetric, tongue midline. MOTOR: normal bulk and tone, full strength in the BUE, BLE SENSORY: normal and symmetric to light touch, pinprick, temperature, vibration COORDINATION: finger-nose-finger, fine finger movements normal REFLEXES: deep tendon reflexes present and symmetric GAIT/STATION: narrow based gait; able to walk on toes, heels and tandem; romberg is negative   DIAGNOSTIC DATA (LABS, IMAGING, TESTING) - I reviewed patient records, labs, notes, testing and imaging myself where available.  Lab Results  Component Value Date   WBC 7.8 05/20/2012   HGB 11.8* 05/20/2012   HCT 37.0* 05/20/2012   MCV 88.6 05/20/2012      Component Value Date/Time   NA 141 05/20/2012 1042   K 4.5 05/20/2012 1042   CL 107 05/20/2012 1042   CO2 25 05/20/2012 1042   GLUCOSE 87 05/20/2012 1042   BUN 10 05/20/2012 1042   CREATININE 0.71 05/20/2012 1042   CALCIUM 9.8 05/20/2012 1042   PROT 7.2 05/20/2012 1042   ALBUMIN 4.3 05/20/2012 1042   AST 9 05/20/2012 1042   ALT <8 05/20/2012 1042   ALKPHOS 65 05/20/2012 1042   BILITOT 0.3 05/20/2012 1042   No results found for this basename: CHOL,  HDL,  LDLCALC,  LDLDIRECT,  TRIG,  CHOLHDL   No results found for this basename: HGBA1C   No results found for this basename: VITAMINB12   Lab Results  Component Value Date   TSH 0.965 05/20/2012   I reviewed images myself and agree with interpretation.   CT HEAD - normal   ASSESSMENT AND PLAN  22 y.o. year old female here with new-onset headaches starting one year ago with severe pain, neck pain, blurred vision, nausea, vomiting, as well as recent episode of transient aphasia. May represent complicated migraine. Will proceed with testing and migraine treatment.  Ddx: migraine with aura, secondary HA, autoimmune, inflamm, hormonal  PLAN: Orders Placed This Encounter  Procedures  . MR Brain Wo Contrast  . TSH  . Vitamin B12  . CBC With differential/Platelet  .  Comprehensive metabolic panel  . Hemoglobin A1c    Meds ordered this encounter  Medications  . topiramate (TOPAMAX) 50 MG tablet    Sig: Take 1 tablet (50 mg total) by mouth 2 (two) times daily.    Dispense:  60 tablet    Refill:  12  . rizatriptan (MAXALT-MLT) 10 MG disintegrating tablet    Sig: Take 1 tablet (10 mg total) by mouth as needed for migraine. May repeat in 2 hours if needed    Dispense:  9 tablet    Refill:  11  . ondansetron (ZOFRAN-ODT) 8 MG disintegrating tablet    Sig: Take 1 tablet (8 mg total) by mouth every 8 (eight) hours as needed for nausea.    Dispense:  30 tablet    Refill:  3    Return in about 6 weeks (around 11/14/2013) for with Heide Guile or Yojan Paskett.    Suanne Marker, MD 10/03/2013, 9:36 AM Certified in Neurology, Neurophysiology and Neuroimaging  [facility] [address] [place], [place] [address] [phone]

## 2013-10-04 LAB — CBC WITH DIFFERENTIAL
Basophils Absolute: 0 10*3/uL (ref 0.0–0.2)
HCT: 40.8 % (ref 34.0–46.6)
Lymphocytes Absolute: 1.8 10*3/uL (ref 0.7–3.1)
MCV: 87 fL (ref 79–97)
Monocytes: 7 %
Neutrophils Absolute: 3.6 10*3/uL (ref 1.4–7.0)
RDW: 13.2 % (ref 12.3–15.4)
WBC: 5.8 10*3/uL (ref 3.4–10.8)

## 2013-10-04 LAB — COMPREHENSIVE METABOLIC PANEL
Albumin/Globulin Ratio: 1.9 (ref 1.1–2.5)
Albumin: 4.6 g/dL (ref 3.5–5.5)
Alkaline Phosphatase: 70 IU/L (ref 39–117)
BUN/Creatinine Ratio: 15 (ref 8–20)
BUN: 11 mg/dL (ref 6–20)
Chloride: 105 mmol/L (ref 97–108)
Creatinine, Ser: 0.73 mg/dL (ref 0.57–1.00)
GFR calc Af Amer: 135 mL/min/{1.73_m2} (ref >59)
Globulin, Total: 2.4 g/dL (ref 1.5–4.5)
Total Bilirubin: 0.3 mg/dL (ref 0.0–1.2)

## 2013-10-08 ENCOUNTER — Telehealth: Payer: Self-pay | Admitting: Diagnostic Neuroimaging

## 2013-10-10 NOTE — Telephone Encounter (Signed)
Spoke to patient's mother. Gave lab results and Rx advice per Dr. Marjory Lies. Mother agreed.

## 2013-10-16 ENCOUNTER — Telehealth: Payer: Self-pay

## 2013-10-16 NOTE — Telephone Encounter (Signed)
Medication and allergies: reviewed and updated  90 day supply/mail order: na Local pharmacy: [facility] [address]   Immunizations due:  Declines flu vaccine  A/P:   Not sure of FH--will consult with mom and bring at visit No PSH Gyn--pap--Dr Ar[facility]  To Discuss with Provider: Labs came back from neurology--A1c and glucose was elevated. Non fasting labs. Concerned that she is becoming diabetic Advised to come fasting at appointment

## 2013-10-17 ENCOUNTER — Other Ambulatory Visit: Payer: 59

## 2013-10-18 ENCOUNTER — Encounter: Payer: Self-pay | Admitting: Family Medicine

## 2013-10-18 ENCOUNTER — Ambulatory Visit (INDEPENDENT_AMBULATORY_CARE_PROVIDER_SITE_OTHER): Payer: 59 | Admitting: Family Medicine

## 2013-10-18 VITALS — BP 118/72 | HR 84 | Temp 98.1°F | Resp 16 | Ht 66.0 in | Wt 179.5 lb

## 2013-10-18 DIAGNOSIS — F329 Major depressive disorder, single episode, unspecified: Secondary | ICD-10-CM

## 2013-10-18 DIAGNOSIS — F3289 Other specified depressive episodes: Secondary | ICD-10-CM

## 2013-10-18 DIAGNOSIS — K589 Irritable bowel syndrome without diarrhea: Secondary | ICD-10-CM | POA: Insufficient documentation

## 2013-10-18 DIAGNOSIS — K219 Gastro-esophageal reflux disease without esophagitis: Secondary | ICD-10-CM

## 2013-10-18 MED ORDER — VENLAFAXINE HCL ER 37.5 MG PO CP24: 37.5000 mg | ORAL_CAPSULE | Freq: Every day | ORAL | Status: DC

## 2013-10-18 MED ORDER — RANITIDINE HCL 150 MG PO TABS: 150.0000 mg | ORAL_TABLET | Freq: Two times a day (BID) | ORAL | Status: DC

## 2013-10-18 MED ORDER — DICYCLOMINE HCL 20 MG PO TABS: 20.0000 mg | ORAL_TABLET | Freq: Three times a day (TID) | ORAL | Status: DC

## 2013-10-18 NOTE — Patient Instructions (Signed)
Follow up in 4-6 weeks to recheck symptoms Start the Effexor daily in the AM Take the Bentyl as needed for abdominal cramping Start the Zantac twice daily to decrease acid production Try and get regular exercise- this is a great stress outlet Drink plenty of fluids Healthy, low carb food choices! Consider starting counseling Call with any questions or concerns Hang in there!!!

## 2013-10-18 NOTE — Progress Notes (Signed)
  Subjective:    Patient ID: Cheryl Galvan, female    DOB: 11-23-1991, 22 y.o.   MRN: [id number]  HPI Previous MD- Panosh   Neuro- Penalmali  GYN- [facility] Debbora Dus  'i haven't felt well x1 yr'- 'i've been really tired'.  Reports sleeping all night but waking up feeling poorly rested and able to go back to sleep w/in 2-3 hrs.  Does not snore.  No witnessed breathing pauses.  Is not waking 1-2x/night to use the bathroom.  + nausea- 'constantly'.  Dizziness, 'feeling like i'm going to pass out'.  No relation to food or noted trigger/pattern.  Husband feels sxs are stress related.  + anxiety.  Admits to alternating diarrhea/constipation.  Seeing Neuro for migraines- recent lab work done.   Review of Systems For ROS see HPI     Objective:   Physical Exam  Vitals reviewed. Constitutional: She is oriented to person, place, and time. She appears well-developed and well-nourished. No distress.  HENT:  Head: Normocephalic and atraumatic.  Eyes: Conjunctivae and EOM are normal. Pupils are equal, round, and reactive to light.  Neck: Normal range of motion. Neck supple. No thyromegaly present.  Cardiovascular: Normal rate, regular rhythm, normal heart sounds and intact distal pulses.   No murmur heard. Pulmonary/Chest: Effort normal and breath sounds normal. No respiratory distress.  Abdominal: Soft. She exhibits no distension. There is no tenderness.  Musculoskeletal: She exhibits no edema.  Lymphadenopathy:    She has no cervical adenopathy.  Neurological: She is alert and oriented to person, place, and time.  Skin: Skin is warm and dry.  Psychiatric: She has a normal mood and affect. Her behavior is normal.          Assessment & Plan:

## 2013-10-21 NOTE — Assessment & Plan Note (Signed)
New.  Pt reports alternating diarrhea and constipation associated w/ abdominal spasm.  Start bentyl prn.  Reviewed lifestyle modifications- increased water, fiber, regular exercise.  Will follow.

## 2013-10-21 NOTE — Assessment & Plan Note (Signed)
New.  Suspect pt's ongoing nausea is due to worsening GERD and increased acid production.  Start PPI.  Will follow.

## 2013-10-21 NOTE — Assessment & Plan Note (Signed)
New to provider.  Suspect pt's cluster of sxs- fatigue, IBS, HAs- are physical manifestations of pt's ongoing, untreated depression.  Start daily medication and monitor for sxs improvement.

## 2013-11-15 ENCOUNTER — Ambulatory Visit (INDEPENDENT_AMBULATORY_CARE_PROVIDER_SITE_OTHER): Payer: 59 | Admitting: Family Medicine

## 2013-11-15 DIAGNOSIS — G43109 Migraine with aura, not intractable, without status migrainosus: Secondary | ICD-10-CM

## 2013-11-15 DIAGNOSIS — F329 Major depressive disorder, single episode, unspecified: Secondary | ICD-10-CM

## 2013-11-15 DIAGNOSIS — K589 Irritable bowel syndrome without diarrhea: Secondary | ICD-10-CM

## 2013-11-15 DIAGNOSIS — K219 Gastro-esophageal reflux disease without esophagitis: Secondary | ICD-10-CM

## 2013-11-15 MED ORDER — ALBUTEROL SULFATE HFA 108 (90 BASE) MCG/ACT IN AERS: INHALATION_SPRAY | RESPIRATORY_TRACT | Status: DC

## 2013-11-15 NOTE — Assessment & Plan Note (Signed)
Improved since starting Effexor.  Pt feels 'much better'.  Continue Effexor.  No med changes.

## 2013-11-15 NOTE — Assessment & Plan Note (Signed)
Spasms have improved w/ bentyl but the alternating diarrhea and constipation has not changed.  Will follow.

## 2013-11-15 NOTE — Progress Notes (Signed)
   Subjective:    Patient ID: Cheryl Galvan, female    DOB: 30-Sep-1991, 22 y.o.   MRN: [id number]  HPI Depression- new dx at last visit.  Started on Effexor.  No recent migraines.  Mood has improved, motivation and energy have improved.  IBS- pt reports Bentyl works for abd spasm.  Still having alternating diarrhea and constipation.  GERD and nausea have improved w/ Zantac.   Review of Systems For ROS see HPI     Objective:   Physical Exam  Vitals reviewed. Constitutional: She is oriented to person, place, and time. She appears well-developed and well-nourished. No distress.  HENT:  Head: Normocephalic and atraumatic.  Eyes: Conjunctivae and EOM are normal. Pupils are equal, round, and reactive to light.  Neck: Normal range of motion. Neck supple. No thyromegaly present.  Cardiovascular: Normal rate, regular rhythm, normal heart sounds and intact distal pulses.   No murmur heard. Pulmonary/Chest: Effort normal and breath sounds normal. No respiratory distress.  Abdominal: Soft. She exhibits no distension. There is no tenderness.  Musculoskeletal: She exhibits no edema.  Lymphadenopathy:    She has no cervical adenopathy.  Neurological: She is alert and oriented to person, place, and time.  Skin: Skin is warm and dry.  Psychiatric: She has a normal mood and affect. Her behavior is normal.          Assessment & Plan:

## 2013-11-15 NOTE — Assessment & Plan Note (Signed)
Improved since starting Zantac.  Continue.

## 2013-11-15 NOTE — Patient Instructions (Signed)
Schedule your complete physical in 6 months Continue the Effexor daily (along w/ the topamax) Continue the Ranitidine for the reflux Use the bentyl as needed Keep up the good work!  You look great! Call with any questions or concerns Happy Holidays!!!

## 2013-11-15 NOTE — Assessment & Plan Note (Signed)
Pt has not had recent episode since starting Topamax in combo w/ Effexor.  No med changes.

## 2013-11-16 ENCOUNTER — Ambulatory Visit: Payer: 59 | Admitting: Nurse Practitioner

## 2013-11-26 ENCOUNTER — Ambulatory Visit: Payer: 59 | Admitting: Family Medicine

## 2014-01-27 ENCOUNTER — Other Ambulatory Visit: Payer: Self-pay | Admitting: Family Medicine

## 2014-01-29 NOTE — Telephone Encounter (Signed)
Med filled.  

## 2014-03-15 ENCOUNTER — Telehealth: Payer: Self-pay | Admitting: Family Medicine

## 2014-03-15 NOTE — Telephone Encounter (Signed)
4.10.15  Pt recently saw a dermatologist and was given the RX clindamycin 600 mg.  Dermatologist wanted pt to check with PCP in regards to mixing with pt's current stomach RX. Please advise.

## 2014-03-15 NOTE — Telephone Encounter (Signed)
Pt.notified

## 2014-03-15 NOTE — Telephone Encounter (Signed)
Please advise 

## 2014-03-15 NOTE — Telephone Encounter (Signed)
As long as pt takes Clindamycin w/ food there should be no problems.

## 2014-04-22 ENCOUNTER — Ambulatory Visit (INDEPENDENT_AMBULATORY_CARE_PROVIDER_SITE_OTHER): Payer: BC Managed Care – PPO | Admitting: Family Medicine

## 2014-04-22 ENCOUNTER — Encounter: Payer: Self-pay | Admitting: Family Medicine

## 2014-04-22 VITALS — BP 118/82 | HR 76 | Temp 98.2°F | Resp 16 | Wt 172.4 lb

## 2014-04-22 DIAGNOSIS — F329 Major depressive disorder, single episode, unspecified: Secondary | ICD-10-CM

## 2014-04-22 DIAGNOSIS — F3289 Other specified depressive episodes: Secondary | ICD-10-CM

## 2014-04-22 MED ORDER — CITALOPRAM HYDROBROMIDE 20 MG PO TABS: 20.0000 mg | ORAL_TABLET | Freq: Every day | ORAL | Status: DC

## 2014-04-22 NOTE — Progress Notes (Signed)
   Subjective:    Patient ID: Cheryl Galvan, female    DOB: December 05, 1991, 23 y.o.   MRN: [id number]  HPI Depression- pt felt that Effexor was triggering HAs.  She was again having difficulty waking up in the AM.  Stopped taking med abruptly 1 month ago.  Had 'mental breakdown' at work last week.  Was in tears over a phone call.  Pt feels strongly that she needs something but not Effexor.   Review of Systems For ROS see HPI     Objective:   Physical Exam  Vitals reviewed. Constitutional: She is oriented to person, place, and time. She appears well-developed and well-nourished. No distress.  HENT:  Head: Normocephalic and atraumatic.  Neurological: She is alert and oriented to person, place, and time.  Skin: Skin is warm and dry.  Psychiatric: She has a normal mood and affect. Her behavior is normal. Thought content normal.          Assessment & Plan:

## 2014-04-22 NOTE — Patient Instructions (Signed)
Follow up as scheduled Start the Celexa daily for the anxiety Call with any questions or concerns Happy Memorial Day!!!

## 2014-04-22 NOTE — Progress Notes (Signed)
Pre visit review using our clinic review tool, if applicable. No additional management support is needed unless otherwise documented below in the visit note. 

## 2014-04-22 NOTE — Assessment & Plan Note (Signed)
Deteriorated since stopping Effexor.  Will not restart due to increased frequency of migraines while on Effexor.  Pt's mom is on Celexa and suggested she try this.  Will start medication and monitor closely for symptom improvement.  Pt expressed understanding and is in agreement w/ plan.

## 2014-05-16 ENCOUNTER — Encounter: Payer: 59 | Admitting: Family Medicine

## 2014-11-18 ENCOUNTER — Ambulatory Visit (INDEPENDENT_AMBULATORY_CARE_PROVIDER_SITE_OTHER): Payer: BC Managed Care – PPO | Admitting: Family Medicine

## 2014-11-18 ENCOUNTER — Encounter: Payer: Self-pay | Admitting: Family Medicine

## 2014-11-18 VITALS — BP 130/86 | HR 84 | Temp 98.2°F | Resp 16 | Wt 183.0 lb

## 2014-11-18 DIAGNOSIS — R5383 Other fatigue: Secondary | ICD-10-CM | POA: Insufficient documentation

## 2014-11-18 NOTE — Patient Instructions (Signed)
Follow up as needed We'll notify you of your lab results and make any changes if needed Start OTC Melatonin ~30 min before bed to help regulate the sleep/wake cycle Call with any questions or concerns Hang in there! Happy Holidays!!!

## 2014-11-18 NOTE — Progress Notes (Signed)
   Subjective:    Patient ID: Cheryl Galvan, female    DOB: 1991/10/12, 23 y.o.   MRN: [id number]  HPI Fatigue- pt reports sxs started 'a couple of months' ago.  Going to bed ~10 and waking at 7.  Not sleeping through the night.  Waking multiple times/night.  Pt denies increased stress.  Not currently having HAs, IBS, depression is well controlled.  Denies increased thirst, urination.  + family hx of DM, thyroid issues.  'i'm not going to lie, if mom didn't make appt, i wouldn't have come'.  Not snoring at night.   Review of Systems For ROS see HPI     Objective:   Physical Exam  Constitutional: She is oriented to person, place, and time. She appears well-developed and well-nourished. No distress.  HENT:  Head: Normocephalic and atraumatic.  Eyes: Conjunctivae and EOM are normal. Pupils are equal, round, and reactive to light.  Neck: Normal range of motion. Neck supple. No thyromegaly present.  Cardiovascular: Normal rate, regular rhythm, normal heart sounds and intact distal pulses.   No murmur heard. Pulmonary/Chest: Effort normal and breath sounds normal. No respiratory distress.  Abdominal: Soft. She exhibits no distension. There is no tenderness.  Musculoskeletal: She exhibits no edema.  Lymphadenopathy:    She has no cervical adenopathy.  Neurological: She is alert and oriented to person, place, and time.  Skin: Skin is warm and dry.  Psychiatric: She has a normal mood and affect. Her behavior is normal.  Vitals reviewed.         Assessment & Plan:

## 2014-11-18 NOTE — Progress Notes (Signed)
Pre visit review using our clinic review tool, if applicable. No additional management support is needed unless otherwise documented below in the visit note. 

## 2014-11-19 LAB — CBC WITH DIFFERENTIAL/PLATELET
BASOS PCT: 0.4 % (ref 0.0–3.0)
Basophils Absolute: 0 10*3/uL (ref 0.0–0.1)
EOS ABS: 0.1 10*3/uL (ref 0.0–0.7)
EOS PCT: 0.7 % (ref 0.0–5.0)
HCT: 41.3 % (ref 36.0–46.0)
HEMOGLOBIN: 13.4 g/dL (ref 12.0–15.0)
LYMPHS PCT: 31.2 % (ref 12.0–46.0)
Lymphs Abs: 2.4 10*3/uL (ref 0.7–4.0)
MCHC: 32.5 g/dL (ref 30.0–36.0)
MCV: 87.2 fl (ref 78.0–100.0)
MONOS PCT: 4.1 % (ref 3.0–12.0)
Monocytes Absolute: 0.3 10*3/uL (ref 0.1–1.0)
NEUTROS ABS: 4.9 10*3/uL (ref 1.4–7.7)
NEUTROS PCT: 63.6 % (ref 43.0–77.0)
Platelets: 275 10*3/uL (ref 150.0–400.0)
RBC: 4.73 Mil/uL (ref 3.87–5.11)
RDW: 13.4 % (ref 11.5–15.5)
WBC: 7.8 10*3/uL (ref 4.0–10.5)

## 2014-11-19 LAB — BASIC METABOLIC PANEL
BUN: 12 mg/dL (ref 6–23)
CALCIUM: 9.4 mg/dL (ref 8.4–10.5)
CO2: 25 mEq/L (ref 19–32)
Chloride: 104 mEq/L (ref 96–112)
Creatinine, Ser: 0.7 mg/dL (ref 0.4–1.2)
GFR: 110.01 mL/min (ref >60.00)
Glucose, Bld: 88 mg/dL (ref 70–99)
POTASSIUM: 3.4 meq/L — AB (ref 3.5–5.1)
SODIUM: 135 meq/L (ref 135–145)

## 2014-11-19 LAB — TSH: TSH: 1.54 u[IU]/mL (ref 0.35–4.50)

## 2014-11-19 NOTE — Assessment & Plan Note (Signed)
New.  Pt has hx of depression and IBS- fatigue could be linked to either of these.  Will get labs to r/o thyroid abnormality, anemia, electrolyte disturbance.  Reviewed sleep hygiene.  Discussed Melatonin use.  May need to start Trazodone in the future if sleep doesn't improve.  Will follow.

## 2014-11-20 ENCOUNTER — Encounter: Payer: Self-pay | Admitting: General Practice

## 2015-02-26 ENCOUNTER — Ambulatory Visit (INDEPENDENT_AMBULATORY_CARE_PROVIDER_SITE_OTHER): Payer: BLUE CROSS/BLUE SHIELD | Admitting: Emergency Medicine

## 2015-02-26 VITALS — BP 120/78 | HR 78 | Temp 98.2°F | Resp 18 | Ht 66.0 in | Wt 176.8 lb

## 2015-02-26 DIAGNOSIS — R112 Nausea with vomiting, unspecified: Secondary | ICD-10-CM

## 2015-02-26 DIAGNOSIS — B349 Viral infection, unspecified: Secondary | ICD-10-CM | POA: Diagnosis not present

## 2015-02-26 DIAGNOSIS — R42 Dizziness and giddiness: Secondary | ICD-10-CM | POA: Diagnosis not present

## 2015-02-26 DIAGNOSIS — J029 Acute pharyngitis, unspecified: Secondary | ICD-10-CM | POA: Diagnosis not present

## 2015-02-26 LAB — POCT CBC
Granulocyte percent: 64.9 %G (ref 37–80)
HCT, POC: 43.2 % (ref 37.7–47.9)
HEMOGLOBIN: 14.3 g/dL (ref 12.2–16.2)
Lymph, poc: 1.9 (ref 0.6–3.4)
MCH: 28.4 pg (ref 27–31.2)
MCHC: 33.1 g/dL (ref 31.8–35.4)
MCV: 85.7 fL (ref 80–97)
MID (CBC): 0.4 (ref 0–0.9)
MPV: 7.8 fL (ref 0–99.8)
PLATELET COUNT, POC: 268 10*3/uL (ref 142–424)
POC Granulocyte: 4.2 (ref 2–6.9)
POC LYMPH %: 29.1 % (ref 10–50)
POC MID %: 6 % (ref 0–12)
RBC: 5.04 M/uL (ref 4.04–5.48)
RDW, POC: 13.4 %
WBC: 6.5 10*3/uL (ref 4.6–10.2)

## 2015-02-26 LAB — POCT URINALYSIS DIPSTICK
Bilirubin, UA: NEGATIVE
Blood, UA: NEGATIVE
Glucose, UA: NEGATIVE
KETONES UA: NEGATIVE
Leukocytes, UA: NEGATIVE
Nitrite, UA: NEGATIVE
PH UA: 6.5
PROTEIN UA: NEGATIVE
SPEC GRAV UA: 1.02
Urobilinogen, UA: 0.2

## 2015-02-26 LAB — POCT UA - MICROSCOPIC ONLY
Bacteria, U Microscopic: NEGATIVE
CASTS, UR, LPF, POC: NEGATIVE
Crystals, Ur, HPF, POC: NEGATIVE
MUCUS UA: NEGATIVE
RBC, urine, microscopic: NEGATIVE
YEAST UA: NEGATIVE

## 2015-02-26 LAB — POCT URINE PREGNANCY: PREG TEST UR: NEGATIVE

## 2015-02-26 LAB — POCT RAPID STREP A (OFFICE): RAPID STREP A SCREEN: NEGATIVE

## 2015-02-26 LAB — POCT INFLUENZA A/B
Influenza A, POC: NEGATIVE
Influenza B, POC: NEGATIVE

## 2015-02-26 NOTE — Progress Notes (Addendum)
Subjective:    Patient ID: Cheryl Galvan, female    DOB: [birth year], 24 y.o.   MRN: [id number] This chart was scribed for Lesle ChrisSteven Daub, MD by Littie Deedsichard Sun, Medical Scribe. This patient was seen in room 3 and the patient's care was started at 9:30 AM.   HPI HPI Comments: Cheryl Galvan is a 24 y.o. female who presents to the [facility] complaining of gradual onset URI symptoms that started 3 days ago. Patient started with cough, sore throat, headache, generalized myalgias, subjective fever, and mild abdominal pain, described as a "turning" sensation. Her sore throat had improved significantly the following day. She also started having eye pain yesterday.  She did not go to work 2 days ago but did work yesterday. Her symptoms had improved overall yesterday, but worsened this morning. As she was driving to work this morning, she started feeling dizzy, lightheaded and near-syncopal. She has also had diarrhea, with two episodes today. Patient has taken Advil for her headache. 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 Patient denies vomiting. She also denies recent travel and recent antibiotics. She has been drinking fluids, drinking about 60 oz of water per day. Her LNMP was 2/29 - 3/2.  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 She recently moved to the area a few weeks ago.  Review of Systems  Constitutional: Positive for fever.  HENT: Positive for sore throat.   Eyes: Positive for pain.  Respiratory: Positive for cough.   Gastrointestinal: Positive for nausea, abdominal pain and diarrhea. Negative for vomiting.  Musculoskeletal: Positive for myalgias.  Neurological: Positive for dizziness, light-headedness and headaches.       Objective:   Physical Exam CONSTITUTIONAL: Well developed/well nourished HEAD: Normocephalic/atraumatic EYES: EOM/PERRL. Mild left  subconjunctival hemorrhage. ENMT: Mucous membranes moist. Throat is slightly red.  NECK: supple no meningeal signs SPINE: entire spine nontender CV: S1/S2 noted, no murmurs/rubs/gallops noted LUNGS: Lungs are clear to auscultation bilaterally, no apparent distress ABDOMEN: soft, nontender, no rebound or guarding GU: no cva tenderness NEURO: Pt is awake/alert, moves all extremitiesx4 EXTREMITIES: pulses normal, full ROM SKIN: warm, color normal PSYCH: no abnormalities of mood noted Results for orders placed or performed in visit on 02/26/15  POCT CBC  Result Value Ref Range   WBC 6.5 4.6 - 10.2 K/uL   Lymph, poc 1.9 0.6 - 3.4   POC LYMPH PERCENT 29.1 10 - 50 %L   MID (cbc) 0.4 0 - 0.9   POC MID % 6.0 0 - 12 %M   POC Granulocyte 4.2 2 - 6.9   Granulocyte percent 64.9 37 - 80 %G   RBC 5.04 4.04 - 5.48 M/uL   Hemoglobin 14.3 12.2 - 16.2 g/dL   HCT, POC 40.943.2 81.137.7 - 47.9 %   MCV 85.7 80 - 97 fL   MCH, POC 28.4 27 - 31.2 pg   MCHC 33.1 31.8 - 35.4 g/dL   RDW, POC 91.413.4 %   Platelet Count, POC 268 142 - 424 K/uL   MPV 7.8 0 - 99.8 fL  POCT Influenza A/B  Result Value Ref Range   Influenza A, POC Negative    Influenza B, POC Negative   POCT rapid strep A  Result Value Ref Range   Rapid Strep A Screen Negative Negative  POCT UA - Microscopic Only  Result Value Ref Range   WBC, Ur, HPF,  POC 0-2    RBC, urine, microscopic neg    Bacteria, U Microscopic neg    Mucus, UA neg    Epithelial cells, urine per micros 0-3    Crystals, Ur, HPF, POC neg    Casts, Ur, LPF, POC neg    Yeast, UA neg   POCT urinalysis dipstick  Result Value Ref Range   Color, UA yellow    Clarity, UA clear    Glucose, UA neg    Bilirubin, UA neg    Ketones, UA neg    Spec Grav, UA 1.020    Blood, UA neg    pH, UA 6.5    Protein, UA neg    Urobilinogen, UA 0.2    Nitrite, UA neg    Leukocytes, UA Negative   POCT urine pregnancy  Result Value Ref Range   Preg Test, Ur Negative             Assessment & Plan:  Physical exam is normal screening labs look good she needs to rest with fluids Tylenol return to work next week.I personally performed the services described in this documentation, which was scribed in my presence. The recorded information has been reviewed and is accurate.

## 2015-11-24 ENCOUNTER — Encounter: Payer: Self-pay | Admitting: Family Medicine

## 2015-11-24 ENCOUNTER — Ambulatory Visit (INDEPENDENT_AMBULATORY_CARE_PROVIDER_SITE_OTHER): Payer: BLUE CROSS/BLUE SHIELD | Admitting: Family Medicine

## 2015-11-24 VITALS — BP 126/82 | HR 92 | Temp 98.2°F | Resp 16 | Ht 66.0 in | Wt 186.1 lb

## 2015-11-24 DIAGNOSIS — J019 Acute sinusitis, unspecified: Secondary | ICD-10-CM | POA: Insufficient documentation

## 2015-11-24 DIAGNOSIS — J0110 Acute frontal sinusitis, unspecified: Secondary | ICD-10-CM | POA: Insufficient documentation

## 2015-11-24 MED ORDER — AMOXICILLIN 875 MG PO TABS: 875.0000 mg | ORAL_TABLET | Freq: Two times a day (BID) | ORAL | Status: DC

## 2015-11-24 MED ORDER — PROMETHAZINE-DM 6.25-15 MG/5ML PO SYRP: 5.0000 mL | ORAL_SOLUTION | Freq: Four times a day (QID) | ORAL | Status: DC | PRN

## 2015-11-24 MED ORDER — BENZONATATE 200 MG PO CAPS: 200.0000 mg | ORAL_CAPSULE | Freq: Three times a day (TID) | ORAL | Status: DC | PRN

## 2015-11-24 NOTE — Progress Notes (Signed)
Pre visit review using our clinic review tool, if applicable. No additional management support is needed unless otherwise documented below in the visit note. 

## 2015-11-24 NOTE — Progress Notes (Signed)
   Subjective:    Patient ID: Cheryl Galvan, female    DOB: June 24, 1991, 24 y.o.   MRN: [id number]  HPI URI- sxs started w/ a cough ~1 month ago.  Sore throat started 6 days ago.  No fever.  Mild runny nose.  Denies sinus pain/pressure.  + HA.  R ear pain, no L ear pain.  + sick contacts- mom w/ PNA, sister w/ mono.  + nausea, no vomiting.   Review of Systems For ROS see HPI     Objective:   Physical Exam  Constitutional: She appears well-developed and well-nourished. No distress.  HENT:  Head: Normocephalic and atraumatic.  Right Ear: Tympanic membrane normal.  Left Ear: Tympanic membrane normal.  Nose: Mucosal edema and rhinorrhea present. Right sinus exhibits maxillary sinus tenderness and frontal sinus tenderness. Left sinus exhibits maxillary sinus tenderness and frontal sinus tenderness.  Mouth/Throat: Uvula is midline and mucous membranes are normal. Posterior oropharyngeal erythema present. No oropharyngeal exudate.  Eyes: Conjunctivae and EOM are normal. Pupils are equal, round, and reactive to light.  Neck: Normal range of motion. Neck supple.  Cardiovascular: Normal rate, regular rhythm and normal heart sounds.   Pulmonary/Chest: Effort normal and breath sounds normal. No respiratory distress. She has no wheezes.  + hacking cough  Lymphadenopathy:    She has no cervical adenopathy.  Vitals reviewed.         Assessment & Plan:

## 2015-11-24 NOTE — Patient Instructions (Signed)
Follow up as needed Start the Amoxicillin twice daily Use the cough pills for daytime cough and syrup for night cough (will cause drowsiness) Drink plenty of fluids REST!!! Call with any questions or concerns If you want to join us at the new [place] office, any scheduled appointments will automatically transfer and we will see you at [address] Cheryl Galvan, [place], [place] [id number] (OPENING 12/09/15) Hang in there! Happy Holidays!!

## 2015-11-24 NOTE — Assessment & Plan Note (Signed)
Pt's sxs and PE consistent w/ infxn.  Start abx.  Cough meds prn.  Reviewed supportive care and red flags that should prompt return.  Pt expressed understanding and is in agreement w/ plan.  

## 2016-03-11 DIAGNOSIS — M25551 Pain in right hip: Secondary | ICD-10-CM | POA: Diagnosis not present

## 2016-03-25 DIAGNOSIS — M25551 Pain in right hip: Secondary | ICD-10-CM | POA: Diagnosis not present

## 2016-03-31 DIAGNOSIS — M25551 Pain in right hip: Secondary | ICD-10-CM | POA: Diagnosis not present

## 2016-07-13 DIAGNOSIS — Z01419 Encounter for gynecological examination (general) (routine) without abnormal findings: Secondary | ICD-10-CM | POA: Diagnosis not present

## 2016-07-13 DIAGNOSIS — Z6831 Body mass index (BMI) 31.0-31.9, adult: Secondary | ICD-10-CM | POA: Diagnosis not present

## 2016-07-13 DIAGNOSIS — Z3169 Encounter for other general counseling and advice on procreation: Secondary | ICD-10-CM | POA: Diagnosis not present

## 2016-07-16 DIAGNOSIS — R112 Nausea with vomiting, unspecified: Secondary | ICD-10-CM | POA: Diagnosis not present

## 2016-07-16 DIAGNOSIS — R197 Diarrhea, unspecified: Secondary | ICD-10-CM | POA: Diagnosis not present

## 2016-08-31 ENCOUNTER — Ambulatory Visit (INDEPENDENT_AMBULATORY_CARE_PROVIDER_SITE_OTHER): Payer: BLUE CROSS/BLUE SHIELD | Admitting: Allergy and Immunology

## 2016-08-31 ENCOUNTER — Encounter (INDEPENDENT_AMBULATORY_CARE_PROVIDER_SITE_OTHER): Payer: Self-pay

## 2016-08-31 ENCOUNTER — Encounter: Payer: Self-pay | Admitting: Allergy and Immunology

## 2016-08-31 VITALS — BP 116/64 | HR 70 | Temp 97.7°F | Resp 16 | Ht 65.75 in | Wt 189.2 lb

## 2016-08-31 DIAGNOSIS — H1045 Other chronic allergic conjunctivitis: Secondary | ICD-10-CM

## 2016-08-31 DIAGNOSIS — J4540 Moderate persistent asthma, uncomplicated: Secondary | ICD-10-CM | POA: Diagnosis not present

## 2016-08-31 DIAGNOSIS — H1010 Acute atopic conjunctivitis, unspecified eye: Secondary | ICD-10-CM | POA: Insufficient documentation

## 2016-08-31 DIAGNOSIS — J3089 Other allergic rhinitis: Secondary | ICD-10-CM

## 2016-08-31 MED ORDER — AZELASTINE-FLUTICASONE 137-50 MCG/ACT NA SUSP: 2.0000 | NASAL | 5 refills | Status: DC

## 2016-08-31 MED ORDER — MONTELUKAST SODIUM 10 MG PO TABS: 10.0000 mg | ORAL_TABLET | Freq: Every day | ORAL | 5 refills | Status: DC

## 2016-08-31 MED ORDER — BUDESONIDE-FORMOTEROL FUMARATE 160-4.5 MCG/ACT IN AERO: 2.0000 | INHALATION_SPRAY | Freq: Two times a day (BID) | RESPIRATORY_TRACT | 5 refills | Status: DC

## 2016-08-31 MED ORDER — OLOPATADINE HCL 0.2 % OP SOLN: 1.0000 [drp] | OPHTHALMIC | 5 refills | Status: DC

## 2016-08-31 MED ORDER — ALBUTEROL SULFATE 108 (90 BASE) MCG/ACT IN AEPB: 2.0000 | INHALATION_SPRAY | RESPIRATORY_TRACT | 2 refills | Status: DC | PRN

## 2016-08-31 MED ORDER — LEVOCETIRIZINE DIHYDROCHLORIDE 5 MG PO TABS: 5.0000 mg | ORAL_TABLET | Freq: Every evening | ORAL | 5 refills | Status: DC

## 2016-08-31 NOTE — Assessment & Plan Note (Addendum)
    A prescription has been provided for Symbicort (budesonide/formoterol) 160/4.5 g, 2 inhalations twice a day. To maximize pulmonary deposition, a spacer has been provided along with instructions for its proper administration with an HFA inhaler.  A prescription has been provided for montelukast 10 mg daily at bedtime.  A prescription has been provided for ProAir Respiclick, 1-2 inhalations every 4-6 hours as needed and 15 minutes prior to exercise.  Subjective and objective measures of pulmonary function will be followed and the treatment plan will be adjusted accordingly.

## 2016-08-31 NOTE — Assessment & Plan Note (Addendum)
   Aeroallergen avoidance measures have been discussed and provided in written form.  A prescription has been provided for levocetirizine, 5mg  daily as needed.  A sample and prescription have been provided for Dymista (azelastine/fluticasone) nasal spray, 1 spray per nostril twice daily as needed. Proper nasal spray technique has been discussed and demonstrated.  I have also recommended nasal saline spray (i.e. Simply Saline) as needed prior to medicated nasal sprays.  If allergen avoidance measures and medications fail to adequately relieve symptoms, aeroallergen immunotherapy will be considered.

## 2016-08-31 NOTE — Assessment & Plan Note (Signed)
   Treatment plan as outlined above for allergic rhinitis.  A prescription has been provided for Pazeo, one drop per eye daily as needed. 

## 2016-08-31 NOTE — Progress Notes (Signed)
New Patient Note  RE: Cheryl Galvan MRN: [id number] DOB: [birth year] Date of Office Visit: 08/31/2016  Referring provider: Sheliah Hatch, MD Primary care provider: Neena Rhymes, MD  Chief Complaint: Asthma (SOB WITH RUNNING.); Wheezing; and Cough   History of present illness: Cheryl Galvan is a 25 y.o. female presenting today for consultation of asthma and rhinitis.  She reports that she was diagnosed with exercise-induced asthma as a child.  Her lower respiratory symptoms with exercise had been relatively stable over the years, however recently she has had progression of symptoms.  She experiences chest tightness, dyspnea, coughing, and wheezing which typically occurs after about 30 minutes of exercise, however will also occur on occasion with mild exertion, such as climbing one flight of stairs.  The symptoms will typically last 1-2 hours after the physical exertion and has ended.  She also experiences lower respiratory symptoms while at rest with such triggers as heat.  She reports that she experiences asthma symptoms every morning upon waking and the symptoms typically last for 1-2 hours.  She denies nocturnal awakenings due to lower respiratory symptoms.  She is attempting to train for a half marathon but states that her training has been hindered because the lower respiratory symptoms.  The asthma symptoms with exercise has occurred despite using albuterol 30 minutes prior to the onset of exercise. Cinnamon experiences nasal congestion, rhinorrhea, sneezing, postnasal drainage, ocular pruritus, and occasional maxillary sinus pressure.  These symptoms occur year around but her more frequent and severe in the springtime and summer.  On average she develops 3 sinus infections per year requiring antibiotics.   Assessment and plan: Moderate persistent asthma   A prescription has been provided for Symbicort (budesonide/formoterol) 160/4.5 g,  2 inhalations twice a day. To maximize  pulmonary deposition, a spacer has been provided along with instructions for its proper administration with an HFA inhaler.  A prescription has been provided for montelukast 10 mg daily at bedtime.  A prescription has been provided for ProAir Respiclick, 1-2 inhalations every 4-6 hours as needed and 15 minutes prior to exercise.  Subjective and objective measures of pulmonary function will be followed and the treatment plan will be adjusted accordingly.  Seasonal and perennial allergic rhinitis  Aeroallergen avoidance measures have been discussed and provided in written form.  A prescription has been provided for levocetirizine, 5mg  daily as needed.  A sample and prescription have been provided for Dymista (azelastine/fluticasone) nasal spray, 1 spray per nostril twice daily as needed. Proper nasal spray technique has been discussed and demonstrated.  I have also recommended nasal saline spray (i.e. Simply Saline) as needed prior to medicated nasal sprays.  If allergen avoidance measures and medications fail to adequately relieve symptoms, aeroallergen immunotherapy will be considered.  Seasonal allergic conjunctivitis  Treatment plan as outlined above for allergic rhinitis.  A prescription has been provided for Pazeo, one drop per eye daily as needed.   Meds ordered this encounter  Medications  . budesonide-formoterol (SYMBICORT) 160-4.5 MCG/ACT inhaler    Sig: Inhale 2 puffs into the lungs 2 (two) times daily. USE SPACER.    Dispense:  1 Inhaler    Refill:  5  . montelukast (SINGULAIR) 10 MG tablet    Sig: Take 1 tablet (10 mg total) by mouth at bedtime.    Dispense:  30 tablet    Refill:  5  . Albuterol Sulfate (PROAIR RESPICLICK) 108 (90 Base) MCG/ACT AEPB    Sig: Inhale 2 puffs into the lungs  every 4 (four) hours as needed (cough or wheeze).    Dispense:  1 each    Refill:  2  . levocetirizine (XYZAL) 5 MG tablet    Sig: Take 1 tablet (5 mg total) by mouth every  evening.    Dispense:  30 tablet    Refill:  5  . Olopatadine HCl (PATADAY) 0.2 % SOLN    Sig: Place 1 drop into both eyes 1 day or 1 dose.    Dispense:  1 Bottle    Refill:  5  . Azelastine-Fluticasone (DYMISTA) 137-50 MCG/ACT SUSP    Sig: Place 2 sprays into both nostrils 1 day or 1 dose.    Dispense:  1 Bottle    Refill:  5    Diagnositics: Spirometry: FVC was 3.21 L and FEV1 was 2.88 L (87% predicted) with 140 mL (5%) post bronchodilator improvement while asymptomatic.  Please see scanned spirometry results for details. Epicutaneous testing: Positive to grass pollens and tree pollen. Intradermal testing: Positive to molds.    Physical examination: Blood pressure 116/64, pulse 70, temperature 97.7 F (36.5 C), temperature source Oral, resp. rate 16, height 5' 5.75" (1.67 m), weight 189 lb 3.2 oz (85.8 kg), SpO2 98 %.  General: Alert, interactive, in no acute distress. HEENT: TMs pearly gray, turbinates edematous and pale with clear discharge, post-pharynx moderately erythematous. Neck: Supple without lymphadenopathy. Lungs: Clear to auscultation without wheezing, rhonchi or rales. CV: Normal S1, S2 without murmurs. Abdomen: Nondistended, nontender. Skin: Warm and dry, without lesions or rashes. Extremities:  No clubbing, cyanosis or edema. Neuro:   Grossly intact.  Review of systems:  Review of systems negative except as noted in HPI / PMHx or noted below: Review of Systems  Constitutional: Negative.   HENT: Negative.   Eyes: Negative.   Respiratory: Negative.   Cardiovascular: Negative.   Gastrointestinal: Negative.   Genitourinary: Negative.   Musculoskeletal: Negative.   Skin: Negative.   Neurological: Negative.   Endo/Heme/Allergies: Negative.   Psychiatric/Behavioral: Negative.     Past medical history:  Past Medical History:  Diagnosis Date  . Acne   . Allergy    sesonal allergy   . Amblyopia   . Asthma     no recent difficulties   . Depression   .  Migraine     Past surgical history:  History reviewed. No pertinent surgical history.  Family history: Family History  Problem Relation Age of Onset  . Hypertension Mother   . Arthritis Mother   . Allergic rhinitis Mother   . Allergic rhinitis Sister   . Cancer Maternal Grandmother     colon  . Stroke Maternal Grandmother   . Hypertension Maternal Grandmother   . Cancer Maternal Grandfather     lung cancer  . Stroke Maternal Grandfather   . Cancer Paternal Grandfather     prostate  . Diabetes Paternal Grandfather   . Allergies    . Anemia    . Asthma    . Angioedema Neg Hx   . Eczema Neg Hx   . Immunodeficiency Neg Hx   . Urticaria Neg Hx     Social history: Social History   Social History  . Marital status: Married    Spouse name: N/A  . Number of children: 0  . Years of education: Assc.    Occupational History  .  Other    [employer]   Social History Main Topics  . Smoking status: Never Smoker  . Smokeless  tobacco: Never Used  . Alcohol use Yes     Comment: occasionally; 1 drink weekly  . Drug use: No  . Sexual activity: Yes   Other Topics Concern  . 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   No ets    Caffeine Use: 1 soda daily   Patient lives at home with her family   Environmental History: The patient lives in a new construction house with carpeting in the bedroom and central air/heat.  There are 2 cats in the house which have access to her bedroom.  She is a nonsmoker.    Medication List       Accurate as of 08/31/16 12:42 PM. Always use your most recent med list.          Albuterol Sulfate 108 (90 Base) MCG/ACT Aepb Commonly known as:  PROAIR RESPICLICK Inhale 2 puffs into the lungs every 4 (four) hours as needed (cough or wheeze).     Azelastine-Fluticasone 137-50 MCG/ACT Susp Commonly known as:  DYMISTA Place 2 sprays into both nostrils 1 day or 1 dose.   budesonide-formoterol 160-4.5 MCG/ACT inhaler Commonly known as:  SYMBICORT Inhale 2 puffs into the lungs 2 (two) times daily. USE SPACER.   glucosamine-chondroitin 500-400 MG tablet Take 1 tablet by mouth 3 (three) times daily.   ibuprofen 800 MG tablet Commonly known as:  ADVIL,MOTRIN Take 800 mg by mouth every 8 (eight) hours as needed.   levocetirizine 5 MG tablet Commonly known as:  XYZAL Take 1 tablet (5 mg total) by mouth every evening.   magnesium 30 MG tablet Take 30 mg by mouth 2 (two) times daily.   montelukast 10 MG tablet Commonly known as:  SINGULAIR Take 1 tablet (10 mg total) by mouth at bedtime.   multivitamin-prenatal 27-0.8 MG Tabs tablet Take 1 tablet by mouth daily at 12 noon.   Olopatadine HCl 0.2 % Soln Commonly known as:  PATADAY Place 1 drop into both eyes 1 day or 1 dose.   sulfamethoxazole-trimethoprim 800-160 MG tablet Commonly known as:  BACTRIM DS,SEPTRA DS Take 1 tablet by mouth 2 (two) times daily.       Known medication allergies: No Known Allergies  I appreciate the opportunity to take part in Keia's care. Please do not hesitate to contact me with questions.  Sincerely,   R. Jorene Guestarter Keven Soucy, MD

## 2016-08-31 NOTE — Patient Instructions (Addendum)
Moderate persistent asthma   A prescription has been provided for Symbicort (budesonide/formoterol) 160/4.5 g,  2 inhalations twice a day. To maximize pulmonary deposition, a spacer has been provided along with instructions for its proper administration with an HFA inhaler.  A prescription has been provided for montelukast 10 mg daily at bedtime.  A prescription has been provided for ProAir Respiclick, 1-2 inhalations every 4-6 hours as needed and 15 minutes prior to exercise.  Subjective and objective measures of pulmonary function will be followed and the treatment plan will be adjusted accordingly.  Seasonal and perennial allergic rhinitis  Aeroallergen avoidance measures have been discussed and provided in written form.  A prescription has been provided for levocetirizine, 5mg  daily as needed.  A sample and prescription have been provided for Dymista (azelastine/fluticasone) nasal spray, 1 spray per nostril twice daily as needed. Proper nasal spray technique has been discussed and demonstrated.  I have also recommended nasal saline spray (i.e. Simply Saline) as needed prior to medicated nasal sprays.  If allergen avoidance measures and medications fail to adequately relieve symptoms, aeroallergen immunotherapy will be considered.  Seasonal allergic conjunctivitis  Treatment plan as outlined above for allergic rhinitis.  A prescription has been provided for Pazeo, one drop per eye daily as needed.   Return in about 6 weeks (around 10/12/2016), or if symptoms worsen or fail to improve.  Reducing Pollen Exposure  The American Academy of Allergy, Asthma and Immunology suggests the following steps to reduce your exposure to pollen during allergy seasons.    1. Do not hang sheets or clothing out to dry; pollen may collect on these items. 2. Do not mow lawns or spend time around freshly cut grass; mowing stirs up pollen. 3. Keep windows closed at night.  Keep car windows closed  while driving. 4. Minimize morning activities outdoors, a time when pollen counts are usually at their highest. 5. Stay indoors as much as possible when pollen counts or humidity is high and on windy days when pollen tends to remain in the air longer. 6. Use air conditioning when possible.  Many air conditioners have filters that trap the pollen spores. 7. Use a HEPA room air filter to remove pollen form the indoor air you breathe.   Control of Mold Allergen  Mold and fungi can grow on a variety of surfaces provided certain temperature and moisture conditions exist.  Outdoor molds grow on plants, decaying vegetation and soil.  The major outdoor mold, Alternaria and Cladosporium, are found in very high numbers during hot and dry conditions.  Generally, a late Summer - Fall peak is seen for common outdoor fungal spores.  Rain will temporarily lower outdoor mold spore count, but counts rise rapidly when the rainy period ends.  The most important indoor molds are Aspergillus and Penicillium.  Dark, humid and poorly ventilated basements are ideal sites for mold growth.  The next most common sites of mold growth are the bathroom and the kitchen.  Outdoor Microsoft[employer]old Control 2. Use air conditioning and keep windows closed 3. Avoid exposure to decaying vegetation. 4. Avoid leaf raking. 5. Avoid grain handling. 6. Consider wearing a face mask if working in moldy areas.  Indoor Mold Control 1. Maintain humidity below 50%. 2. Clean washable surfaces with 5% bleach solution. 3. Remove sources e.g. Contaminated carpets.

## 2016-10-12 ENCOUNTER — Ambulatory Visit: Payer: BLUE CROSS/BLUE SHIELD | Admitting: Allergy and Immunology

## 2016-12-01 DIAGNOSIS — J019 Acute sinusitis, unspecified: Secondary | ICD-10-CM | POA: Diagnosis not present

## 2016-12-01 DIAGNOSIS — J029 Acute pharyngitis, unspecified: Secondary | ICD-10-CM | POA: Diagnosis not present

## 2017-01-20 DIAGNOSIS — F411 Generalized anxiety disorder: Secondary | ICD-10-CM | POA: Diagnosis not present

## 2017-01-26 DIAGNOSIS — F411 Generalized anxiety disorder: Secondary | ICD-10-CM | POA: Diagnosis not present

## 2017-02-03 DIAGNOSIS — F411 Generalized anxiety disorder: Secondary | ICD-10-CM | POA: Diagnosis not present

## 2017-02-09 DIAGNOSIS — F411 Generalized anxiety disorder: Secondary | ICD-10-CM | POA: Diagnosis not present

## 2017-02-17 DIAGNOSIS — F411 Generalized anxiety disorder: Secondary | ICD-10-CM | POA: Diagnosis not present

## 2017-03-08 DIAGNOSIS — F411 Generalized anxiety disorder: Secondary | ICD-10-CM | POA: Diagnosis not present

## 2017-03-17 DIAGNOSIS — F411 Generalized anxiety disorder: Secondary | ICD-10-CM | POA: Diagnosis not present

## 2017-03-29 DIAGNOSIS — F411 Generalized anxiety disorder: Secondary | ICD-10-CM | POA: Diagnosis not present

## 2017-04-07 DIAGNOSIS — F411 Generalized anxiety disorder: Secondary | ICD-10-CM | POA: Diagnosis not present

## 2017-04-14 DIAGNOSIS — F411 Generalized anxiety disorder: Secondary | ICD-10-CM | POA: Diagnosis not present

## 2017-04-20 DIAGNOSIS — F411 Generalized anxiety disorder: Secondary | ICD-10-CM | POA: Diagnosis not present

## 2017-05-05 DIAGNOSIS — F411 Generalized anxiety disorder: Secondary | ICD-10-CM | POA: Diagnosis not present

## 2017-05-09 DIAGNOSIS — F411 Generalized anxiety disorder: Secondary | ICD-10-CM | POA: Diagnosis not present

## 2017-05-16 DIAGNOSIS — F411 Generalized anxiety disorder: Secondary | ICD-10-CM | POA: Diagnosis not present

## 2017-05-17 DIAGNOSIS — N760 Acute vaginitis: Secondary | ICD-10-CM | POA: Diagnosis not present

## 2017-05-23 DIAGNOSIS — F411 Generalized anxiety disorder: Secondary | ICD-10-CM | POA: Diagnosis not present

## 2017-06-05 DIAGNOSIS — F411 Generalized anxiety disorder: Secondary | ICD-10-CM | POA: Diagnosis not present

## 2017-06-13 DIAGNOSIS — F411 Generalized anxiety disorder: Secondary | ICD-10-CM | POA: Diagnosis not present

## 2017-06-21 DIAGNOSIS — F411 Generalized anxiety disorder: Secondary | ICD-10-CM | POA: Diagnosis not present

## 2017-06-27 DIAGNOSIS — F411 Generalized anxiety disorder: Secondary | ICD-10-CM | POA: Diagnosis not present

## 2017-07-04 DIAGNOSIS — F411 Generalized anxiety disorder: Secondary | ICD-10-CM | POA: Diagnosis not present

## 2017-07-07 DIAGNOSIS — L700 Acne vulgaris: Secondary | ICD-10-CM | POA: Diagnosis not present

## 2017-07-12 DIAGNOSIS — F411 Generalized anxiety disorder: Secondary | ICD-10-CM | POA: Diagnosis not present

## 2017-07-14 DIAGNOSIS — L680 Hirsutism: Secondary | ICD-10-CM | POA: Diagnosis not present

## 2017-07-14 DIAGNOSIS — N926 Irregular menstruation, unspecified: Secondary | ICD-10-CM | POA: Diagnosis not present

## 2017-07-14 DIAGNOSIS — Z6830 Body mass index (BMI) 30.0-30.9, adult: Secondary | ICD-10-CM | POA: Diagnosis not present

## 2017-07-14 DIAGNOSIS — Z01419 Encounter for gynecological examination (general) (routine) without abnormal findings: Secondary | ICD-10-CM | POA: Diagnosis not present

## 2017-07-28 DIAGNOSIS — L700 Acne vulgaris: Secondary | ICD-10-CM | POA: Diagnosis not present

## 2017-08-03 DIAGNOSIS — F411 Generalized anxiety disorder: Secondary | ICD-10-CM | POA: Diagnosis not present

## 2017-08-16 DIAGNOSIS — F4325 Adjustment disorder with mixed disturbance of emotions and conduct: Secondary | ICD-10-CM | POA: Diagnosis not present

## 2017-08-18 DIAGNOSIS — Z3169 Encounter for other general counseling and advice on procreation: Secondary | ICD-10-CM | POA: Diagnosis not present

## 2017-08-23 DIAGNOSIS — F411 Generalized anxiety disorder: Secondary | ICD-10-CM | POA: Diagnosis not present

## 2017-09-06 DIAGNOSIS — F411 Generalized anxiety disorder: Secondary | ICD-10-CM | POA: Diagnosis not present

## 2017-09-20 DIAGNOSIS — F411 Generalized anxiety disorder: Secondary | ICD-10-CM | POA: Diagnosis not present

## 2017-09-29 DIAGNOSIS — Z3201 Encounter for pregnancy test, result positive: Secondary | ICD-10-CM | POA: Diagnosis not present

## 2017-09-29 DIAGNOSIS — O9989 Other specified diseases and conditions complicating pregnancy, childbirth and the puerperium: Secondary | ICD-10-CM | POA: Diagnosis not present

## 2017-09-29 DIAGNOSIS — Z3A01 Less than 8 weeks gestation of pregnancy: Secondary | ICD-10-CM | POA: Diagnosis not present

## 2017-10-04 DIAGNOSIS — F411 Generalized anxiety disorder: Secondary | ICD-10-CM | POA: Diagnosis not present

## 2017-10-05 DIAGNOSIS — O9989 Other specified diseases and conditions complicating pregnancy, childbirth and the puerperium: Secondary | ICD-10-CM | POA: Diagnosis not present

## 2017-10-11 DIAGNOSIS — Z3201 Encounter for pregnancy test, result positive: Secondary | ICD-10-CM | POA: Diagnosis not present

## 2017-10-14 DIAGNOSIS — Z23 Encounter for immunization: Secondary | ICD-10-CM | POA: Diagnosis not present

## 2017-10-18 DIAGNOSIS — F411 Generalized anxiety disorder: Secondary | ICD-10-CM | POA: Diagnosis not present

## 2017-10-26 DIAGNOSIS — Z3689 Encounter for other specified antenatal screening: Secondary | ICD-10-CM | POA: Diagnosis not present

## 2017-10-26 DIAGNOSIS — Z3401 Encounter for supervision of normal first pregnancy, first trimester: Secondary | ICD-10-CM | POA: Diagnosis not present

## 2017-10-26 LAB — OB RESULTS CONSOLE RUBELLA ANTIBODY, IGM: Rubella: IMMUNE

## 2017-10-26 LAB — OB RESULTS CONSOLE HIV ANTIBODY (ROUTINE TESTING): HIV: NONREACTIVE

## 2017-10-26 LAB — OB RESULTS CONSOLE RPR: RPR: NONREACTIVE

## 2017-10-26 LAB — OB RESULTS CONSOLE HEPATITIS B SURFACE ANTIGEN: Hepatitis B Surface Ag: UNDETERMINED

## 2017-10-31 DIAGNOSIS — F411 Generalized anxiety disorder: Secondary | ICD-10-CM | POA: Diagnosis not present

## 2017-11-07 DIAGNOSIS — Z3682 Encounter for antenatal screening for nuchal translucency: Secondary | ICD-10-CM | POA: Diagnosis not present

## 2017-11-07 DIAGNOSIS — Z36 Encounter for antenatal screening for chromosomal anomalies: Secondary | ICD-10-CM | POA: Diagnosis not present

## 2017-11-07 DIAGNOSIS — Z3689 Encounter for other specified antenatal screening: Secondary | ICD-10-CM | POA: Diagnosis not present

## 2017-11-07 DIAGNOSIS — Z3491 Encounter for supervision of normal pregnancy, unspecified, first trimester: Secondary | ICD-10-CM | POA: Diagnosis not present

## 2017-11-07 DIAGNOSIS — Z3401 Encounter for supervision of normal first pregnancy, first trimester: Secondary | ICD-10-CM | POA: Diagnosis not present

## 2017-11-07 LAB — OB RESULTS CONSOLE GC/CHLAMYDIA
CHLAMYDIA, DNA PROBE: NEGATIVE
Gonorrhea: NEGATIVE

## 2017-11-17 DIAGNOSIS — F411 Generalized anxiety disorder: Secondary | ICD-10-CM | POA: Diagnosis not present

## 2017-11-18 ENCOUNTER — Ambulatory Visit: Payer: BLUE CROSS/BLUE SHIELD | Admitting: Family Medicine

## 2017-11-18 ENCOUNTER — Encounter: Payer: Self-pay | Admitting: Family Medicine

## 2017-11-18 DIAGNOSIS — Z3490 Encounter for supervision of normal pregnancy, unspecified, unspecified trimester: Secondary | ICD-10-CM

## 2017-11-18 DIAGNOSIS — F325 Major depressive disorder, single episode, in full remission: Secondary | ICD-10-CM

## 2017-11-18 DIAGNOSIS — J4540 Moderate persistent asthma, uncomplicated: Secondary | ICD-10-CM

## 2017-11-18 MED ORDER — IPRATROPIUM BROMIDE 0.06 % NA SOLN: 2.0000 | Freq: Four times a day (QID) | NASAL | 0 refills | Status: DC

## 2017-11-18 NOTE — Assessment & Plan Note (Signed)
Doing well off medications.  We will continue to monitor.

## 2017-11-18 NOTE — Assessment & Plan Note (Signed)
Stable.  Seeing therapist twice monthly.  No need for pharmacotherapy at this point.

## 2017-11-18 NOTE — Progress Notes (Signed)
    Subjective:  Cheryl Galvan is a 26 y.o. female who presents today with a chief complaint of right ear pain and to transfer care to this office.   HPI:  Right Ear Pain, New Issue Symptoms started about a week ago.  Worsened over that time.  No obvious precipitating events, however did have URI symptoms over the past week including cough, rhinorrhea, and nasal congestion.  Symptoms are worse when lying down on her right side.  Has tried taking Tylenol which helps a little bit.  No fevers or chills.    Depression, established problem, stable Not currently on any medications.  She sees her therapist every 2 weeks and is doing well with that.  Asthma, established problem, stable Exercise-induced.  Not currently on any medications.  Pregnancy, new issue Patient is [redacted] weeks pregnant.  She has stopped all of her medications except for ranitidine.  [facility] by Dr. Rosemary Holmsavon.  ROS: Per HPI  PMH: Smoking history reviewed.  Never smoker.  Objective:  Physical Exam: BP 120/74 (BP Location: Left Arm, Patient Position: Sitting, Cuff Size: Normal)   Pulse 84   Temp 98.4 F (36.9 C) (Oral)   Wt 193 lb 3.2 oz (87.6 kg)   SpO2 99%   BMI 31.42 kg/m   Gen: NAD, resting comfortably HEENT: Right TM with clear effusion slightly bulging.  No erythema.  Left TM with clear effusion.  Maxillary sinuses with minimally decreased transillumination bilaterally.  Oropharynx clear.  No lymphadenopathy. CV: RRR with no murmurs appreciated Pulm: NWOB, CTAB with no crackles, wheezes, or rhonchi  Assessment/Plan:  Right ear pain Secondary to eustachian tube dysfunction.  Given her pregnancy, her options are little limited.  Start Atrovent nasal spray.  Discussed typical course of illness with patient.  Return precautions reviewed.  Follow-up as needed.  Major depression in remission (HCC) Stable.  Seeing therapist twice monthly.  No need for pharmacotherapy at this  point.  Moderate persistent asthma Doing well off medications.  We will continue to monitor.  Katina Degreealeb M. Jimmey RalphParker, MD 11/18/2017 12:27 PM

## 2017-11-21 DIAGNOSIS — Z3682 Encounter for antenatal screening for nuchal translucency: Secondary | ICD-10-CM | POA: Diagnosis not present

## 2017-11-21 DIAGNOSIS — Z3401 Encounter for supervision of normal first pregnancy, first trimester: Secondary | ICD-10-CM | POA: Diagnosis not present

## 2017-12-06 NOTE — L&D Delivery Note (Signed)
Delivery Note At 4:57 AM a viable and healthy female was delivered via Vaginal, Spontaneous (Presentation: LOA  ).  APGAR: pending ; weight pending .   Placenta status: spontaneous, intact. ? Abruptio noted after delivery with "port wine" bleeding noted. Placenta inspected, no clot seen  Cord:  with the following complications: none.  Cord pH: unable to obtain  Anesthesia:  Epidural and local Episiotomy:  Done midline due to maternal perineal discomfort and inability to tolerate perineal stretching (after adding local anesthesia) Lacerations:  None- no extensions Suture Repair: 2.0 vicryl rapide Est. Blood Loss (mL):  700- Methergine given, LUS clots manually removed  Mom to postpartum.  Baby to NICU. Baby transferred due to NICU after poor transition.Baby breathing spontaneously and on RA on transfer. Precautionary transfer noted by NICU MD due to tachypnea. Husband to NICU with baby.  Yianni Skilling J 05/27/2018, 5:17 AM

## 2017-12-08 DIAGNOSIS — F411 Generalized anxiety disorder: Secondary | ICD-10-CM | POA: Diagnosis not present

## 2017-12-19 DIAGNOSIS — F411 Generalized anxiety disorder: Secondary | ICD-10-CM | POA: Diagnosis not present

## 2017-12-20 DIAGNOSIS — Z361 Encounter for antenatal screening for raised alphafetoprotein level: Secondary | ICD-10-CM | POA: Diagnosis not present

## 2017-12-20 DIAGNOSIS — Z3402 Encounter for supervision of normal first pregnancy, second trimester: Secondary | ICD-10-CM | POA: Diagnosis not present

## 2018-01-02 DIAGNOSIS — F411 Generalized anxiety disorder: Secondary | ICD-10-CM | POA: Diagnosis not present

## 2018-01-09 DIAGNOSIS — Z3402 Encounter for supervision of normal first pregnancy, second trimester: Secondary | ICD-10-CM | POA: Diagnosis not present

## 2018-01-09 DIAGNOSIS — Z363 Encounter for antenatal screening for malformations: Secondary | ICD-10-CM | POA: Diagnosis not present

## 2018-01-11 ENCOUNTER — Other Ambulatory Visit: Payer: Self-pay | Admitting: Obstetrics and Gynecology

## 2018-01-11 DIAGNOSIS — R1031 Right lower quadrant pain: Principal | ICD-10-CM

## 2018-01-11 DIAGNOSIS — G8929 Other chronic pain: Secondary | ICD-10-CM

## 2018-01-12 ENCOUNTER — Ambulatory Visit (HOSPITAL_COMMUNITY): Payer: BLUE CROSS/BLUE SHIELD

## 2018-01-12 ENCOUNTER — Encounter (HOSPITAL_COMMUNITY): Payer: Self-pay

## 2018-01-17 DIAGNOSIS — F411 Generalized anxiety disorder: Secondary | ICD-10-CM | POA: Diagnosis not present

## 2018-01-18 DIAGNOSIS — Z3A20 20 weeks gestation of pregnancy: Secondary | ICD-10-CM | POA: Diagnosis not present

## 2018-01-18 DIAGNOSIS — Z3402 Encounter for supervision of normal first pregnancy, second trimester: Secondary | ICD-10-CM | POA: Diagnosis not present

## 2018-01-18 DIAGNOSIS — R1031 Right lower quadrant pain: Secondary | ICD-10-CM | POA: Diagnosis not present

## 2018-01-19 ENCOUNTER — Other Ambulatory Visit: Payer: BLUE CROSS/BLUE SHIELD

## 2018-01-23 DIAGNOSIS — Z362 Encounter for other antenatal screening follow-up: Secondary | ICD-10-CM | POA: Diagnosis not present

## 2018-01-23 DIAGNOSIS — Z3402 Encounter for supervision of normal first pregnancy, second trimester: Secondary | ICD-10-CM | POA: Diagnosis not present

## 2018-01-26 DIAGNOSIS — F411 Generalized anxiety disorder: Secondary | ICD-10-CM | POA: Diagnosis not present

## 2018-01-30 DIAGNOSIS — F411 Generalized anxiety disorder: Secondary | ICD-10-CM | POA: Diagnosis not present

## 2018-02-06 DIAGNOSIS — Z3402 Encounter for supervision of normal first pregnancy, second trimester: Secondary | ICD-10-CM | POA: Diagnosis not present

## 2018-02-06 DIAGNOSIS — Z362 Encounter for other antenatal screening follow-up: Secondary | ICD-10-CM | POA: Diagnosis not present

## 2018-02-13 DIAGNOSIS — F411 Generalized anxiety disorder: Secondary | ICD-10-CM | POA: Diagnosis not present

## 2018-02-20 DIAGNOSIS — F411 Generalized anxiety disorder: Secondary | ICD-10-CM | POA: Diagnosis not present

## 2018-02-22 DIAGNOSIS — O26892 Other specified pregnancy related conditions, second trimester: Secondary | ICD-10-CM | POA: Diagnosis not present

## 2018-02-22 DIAGNOSIS — Z3A25 25 weeks gestation of pregnancy: Secondary | ICD-10-CM | POA: Diagnosis not present

## 2018-02-22 DIAGNOSIS — R350 Frequency of micturition: Secondary | ICD-10-CM | POA: Diagnosis not present

## 2018-02-22 DIAGNOSIS — N390 Urinary tract infection, site not specified: Secondary | ICD-10-CM | POA: Diagnosis not present

## 2018-03-06 DIAGNOSIS — Z3A27 27 weeks gestation of pregnancy: Secondary | ICD-10-CM | POA: Diagnosis not present

## 2018-03-06 DIAGNOSIS — Z3689 Encounter for other specified antenatal screening: Secondary | ICD-10-CM | POA: Diagnosis not present

## 2018-03-06 DIAGNOSIS — O43192 Other malformation of placenta, second trimester: Secondary | ICD-10-CM | POA: Diagnosis not present

## 2018-03-07 DIAGNOSIS — F411 Generalized anxiety disorder: Secondary | ICD-10-CM | POA: Diagnosis not present

## 2018-03-13 DIAGNOSIS — O99810 Abnormal glucose complicating pregnancy: Secondary | ICD-10-CM | POA: Diagnosis not present

## 2018-03-13 DIAGNOSIS — Z3A28 28 weeks gestation of pregnancy: Secondary | ICD-10-CM | POA: Diagnosis not present

## 2018-03-13 DIAGNOSIS — Z3689 Encounter for other specified antenatal screening: Secondary | ICD-10-CM | POA: Diagnosis not present

## 2018-03-21 DIAGNOSIS — Z23 Encounter for immunization: Secondary | ICD-10-CM | POA: Diagnosis not present

## 2018-03-21 DIAGNOSIS — O99810 Abnormal glucose complicating pregnancy: Secondary | ICD-10-CM | POA: Diagnosis not present

## 2018-03-21 DIAGNOSIS — Z3A29 29 weeks gestation of pregnancy: Secondary | ICD-10-CM | POA: Diagnosis not present

## 2018-03-22 ENCOUNTER — Encounter: Payer: BLUE CROSS/BLUE SHIELD | Attending: Obstetrics and Gynecology | Admitting: Registered"

## 2018-03-22 DIAGNOSIS — Z713 Dietary counseling and surveillance: Secondary | ICD-10-CM | POA: Diagnosis not present

## 2018-03-22 DIAGNOSIS — O99810 Abnormal glucose complicating pregnancy: Secondary | ICD-10-CM | POA: Diagnosis not present

## 2018-03-22 DIAGNOSIS — Z3A00 Weeks of gestation of pregnancy not specified: Secondary | ICD-10-CM | POA: Diagnosis not present

## 2018-03-24 ENCOUNTER — Encounter: Payer: Self-pay | Admitting: Registered"

## 2018-03-24 DIAGNOSIS — O99810 Abnormal glucose complicating pregnancy: Secondary | ICD-10-CM | POA: Insufficient documentation

## 2018-03-24 NOTE — Progress Notes (Signed)
Patient was seen on 03/22/18 for Gestational Diabetes self-management class at the [facility]. The following learning objectives were met by the patient during this course:   States the definition of Gestational Diabetes  States why dietary management is important in controlling blood glucose  Describes the effects each nutrient has on blood glucose levels  Demonstrates ability to create a balanced meal plan  Demonstrates carbohydrate counting   States when to check blood glucose levels  Demonstrates proper blood glucose monitoring techniques  States the effect of stress and exercise on blood glucose levels  States the importance of limiting caffeine and abstaining from alcohol and smoking  Blood glucose monitor given: Contour Next Lot # [id number] Exp: 05/06/2019 Blood glucose reading: 98  Patient instructed to monitor glucose levels: FBS: 60 - <95; 1 hour: <140; 2 hour: <120  Patient received handouts:  Nutrition Diabetes and Pregnancy, including carb counting list  Patient will be seen for follow-up as needed. 

## 2018-03-30 DIAGNOSIS — F411 Generalized anxiety disorder: Secondary | ICD-10-CM | POA: Diagnosis not present

## 2018-04-04 DIAGNOSIS — O24419 Gestational diabetes mellitus in pregnancy, unspecified control: Secondary | ICD-10-CM | POA: Diagnosis not present

## 2018-04-04 DIAGNOSIS — Z3A31 31 weeks gestation of pregnancy: Secondary | ICD-10-CM | POA: Diagnosis not present

## 2018-04-04 DIAGNOSIS — O24420 Gestational diabetes mellitus in childbirth, diet controlled: Secondary | ICD-10-CM | POA: Diagnosis not present

## 2018-04-07 DIAGNOSIS — O368130 Decreased fetal movements, third trimester, not applicable or unspecified: Secondary | ICD-10-CM | POA: Diagnosis not present

## 2018-04-07 DIAGNOSIS — O24419 Gestational diabetes mellitus in pregnancy, unspecified control: Secondary | ICD-10-CM | POA: Diagnosis not present

## 2018-04-07 DIAGNOSIS — Z3A32 32 weeks gestation of pregnancy: Secondary | ICD-10-CM | POA: Diagnosis not present

## 2018-04-11 DIAGNOSIS — Z3A32 32 weeks gestation of pregnancy: Secondary | ICD-10-CM | POA: Diagnosis not present

## 2018-04-11 DIAGNOSIS — O24419 Gestational diabetes mellitus in pregnancy, unspecified control: Secondary | ICD-10-CM | POA: Diagnosis not present

## 2018-04-12 DIAGNOSIS — F411 Generalized anxiety disorder: Secondary | ICD-10-CM | POA: Diagnosis not present

## 2018-04-17 DIAGNOSIS — Z3A33 33 weeks gestation of pregnancy: Secondary | ICD-10-CM | POA: Diagnosis not present

## 2018-04-17 DIAGNOSIS — O24420 Gestational diabetes mellitus in childbirth, diet controlled: Secondary | ICD-10-CM | POA: Diagnosis not present

## 2018-04-17 DIAGNOSIS — O24419 Gestational diabetes mellitus in pregnancy, unspecified control: Secondary | ICD-10-CM | POA: Diagnosis not present

## 2018-04-21 DIAGNOSIS — F411 Generalized anxiety disorder: Secondary | ICD-10-CM | POA: Diagnosis not present

## 2018-04-25 DIAGNOSIS — F411 Generalized anxiety disorder: Secondary | ICD-10-CM | POA: Diagnosis not present

## 2018-04-26 DIAGNOSIS — Z3A34 34 weeks gestation of pregnancy: Secondary | ICD-10-CM | POA: Diagnosis not present

## 2018-04-26 DIAGNOSIS — O24414 Gestational diabetes mellitus in pregnancy, insulin controlled: Secondary | ICD-10-CM | POA: Diagnosis not present

## 2018-05-02 DIAGNOSIS — Z3A35 35 weeks gestation of pregnancy: Secondary | ICD-10-CM | POA: Diagnosis not present

## 2018-05-02 DIAGNOSIS — O24414 Gestational diabetes mellitus in pregnancy, insulin controlled: Secondary | ICD-10-CM | POA: Diagnosis not present

## 2018-05-02 DIAGNOSIS — Z3685 Encounter for antenatal screening for Streptococcus B: Secondary | ICD-10-CM | POA: Diagnosis not present

## 2018-05-02 LAB — OB RESULTS CONSOLE GBS: GBS: NEGATIVE

## 2018-05-09 DIAGNOSIS — F411 Generalized anxiety disorder: Secondary | ICD-10-CM | POA: Diagnosis not present

## 2018-05-12 DIAGNOSIS — O24414 Gestational diabetes mellitus in pregnancy, insulin controlled: Secondary | ICD-10-CM | POA: Diagnosis not present

## 2018-05-12 DIAGNOSIS — Z3A37 37 weeks gestation of pregnancy: Secondary | ICD-10-CM | POA: Diagnosis not present

## 2018-05-12 DIAGNOSIS — O24420 Gestational diabetes mellitus in childbirth, diet controlled: Secondary | ICD-10-CM | POA: Diagnosis not present

## 2018-05-14 ENCOUNTER — Inpatient Hospital Stay (HOSPITAL_COMMUNITY)
Admission: AD | Admit: 2018-05-14 | Discharge: 2018-05-14 | Disposition: A | Discharge status: Home / Self Care | Payer: BLUE CROSS/BLUE SHIELD | Source: Ambulatory Visit | Attending: Obstetrics and Gynecology | Admitting: Obstetrics and Gynecology

## 2018-05-14 ENCOUNTER — Encounter (HOSPITAL_COMMUNITY): Payer: Self-pay | Admitting: *Deleted

## 2018-05-14 ENCOUNTER — Other Ambulatory Visit: Payer: Self-pay

## 2018-05-14 DIAGNOSIS — O26893 Other specified pregnancy related conditions, third trimester: Secondary | ICD-10-CM | POA: Diagnosis not present

## 2018-05-14 DIAGNOSIS — R42 Dizziness and giddiness: Secondary | ICD-10-CM | POA: Diagnosis not present

## 2018-05-14 DIAGNOSIS — M940 Chondrocostal junction syndrome [Tietze]: Secondary | ICD-10-CM | POA: Diagnosis not present

## 2018-05-14 DIAGNOSIS — R51 Headache: Secondary | ICD-10-CM | POA: Insufficient documentation

## 2018-05-14 DIAGNOSIS — Z3A37 37 weeks gestation of pregnancy: Secondary | ICD-10-CM

## 2018-05-14 DIAGNOSIS — R519 Headache, unspecified: Secondary | ICD-10-CM

## 2018-05-14 LAB — CBC
HCT: 38 % (ref 36.0–46.0)
Hemoglobin: 12.5 g/dL (ref 12.0–15.0)
MCH: 28.8 pg (ref 26.0–34.0)
MCHC: 33.5 g/dL (ref 30.0–36.0)
MCV: 87.6 fL (ref 78.0–100.0)
PLATELETS: 202 10*3/uL (ref 150–400)
RBC: 4.34 MIL/uL (ref 3.87–5.11)
RDW: 14.8 % (ref 11.5–15.5)
WBC: 10.5 10*3/uL (ref 4.0–10.5)

## 2018-05-14 LAB — COMPREHENSIVE METABOLIC PANEL
ALT: 11 U/L — AB (ref 14–54)
AST: 15 U/L (ref 15–41)
Albumin: 2.9 g/dL — ABNORMAL LOW (ref 3.5–5.0)
Alkaline Phosphatase: 95 U/L (ref 38–126)
Anion gap: 9 (ref 5–15)
BILIRUBIN TOTAL: 0.2 mg/dL — AB (ref 0.3–1.2)
BUN: 12 mg/dL (ref 6–20)
CALCIUM: 8.9 mg/dL (ref 8.9–10.3)
CHLORIDE: 112 mmol/L — AB (ref 101–111)
CO2: 18 mmol/L — ABNORMAL LOW (ref 22–32)
CREATININE: 0.66 mg/dL (ref 0.44–1.00)
Glucose, Bld: 79 mg/dL (ref 65–99)
Potassium: 3.6 mmol/L (ref 3.5–5.1)
Sodium: 139 mmol/L (ref 135–145)
TOTAL PROTEIN: 6.5 g/dL (ref 6.5–8.1)

## 2018-05-14 LAB — URINALYSIS, ROUTINE W REFLEX MICROSCOPIC
BILIRUBIN URINE: NEGATIVE
Glucose, UA: NEGATIVE mg/dL
Hgb urine dipstick: NEGATIVE
KETONES UR: NEGATIVE mg/dL
Leukocytes, UA: NEGATIVE
NITRITE: NEGATIVE
PROTEIN: NEGATIVE mg/dL
Specific Gravity, Urine: 1.013 (ref 1.005–1.030)
pH: 7 (ref 5.0–8.0)

## 2018-05-14 LAB — PROTEIN / CREATININE RATIO, URINE
CREATININE, URINE: 98 mg/dL
Protein Creatinine Ratio: 0.09 mg/mg{Cre} (ref 0.00–0.15)
TOTAL PROTEIN, URINE: 9 mg/dL

## 2018-05-14 NOTE — Discharge Instructions (Signed)
Third Trimester of Pregnancy The third trimester is from week 28 through week 40 (months 7 through 9). The third trimester is a time when the unborn baby (fetus) is growing rapidly. At the end of the ninth month, the fetus is about 20 inches in length and weighs 6-10 pounds. Body changes during your third trimester Your body will continue to go through many changes during pregnancy. The changes vary from woman to woman. During the third trimester:  Your weight will continue to increase. You can expect to gain 25-35 pounds (11-16 kg) by the end of the pregnancy.  You may begin to get stretch marks on your hips, abdomen, and breasts.  You may urinate more often because the fetus is moving lower into your pelvis and pressing on your bladder.  You may develop or continue to have heartburn. This is caused by increased hormones that slow down muscles in the digestive tract.  You may develop or continue to have constipation because increased hormones slow digestion and cause the muscles that push waste through your intestines to relax.  You may develop hemorrhoids. These are swollen veins (varicose veins) in the rectum that can itch or be painful.  You may develop swollen, bulging veins (varicose veins) in your legs.  You may have increased body aches in the pelvis, back, or thighs. This is due to weight gain and increased hormones that are relaxing your joints.  You may have changes in your hair. These can include thickening of your hair, rapid growth, and changes in texture. Some women also have hair loss during or after pregnancy, or hair that feels dry or thin. Your hair will most likely return to normal after your baby is born.  Your breasts will continue to grow and they will continue to become tender. A yellow fluid (colostrum) may leak from your breasts. This is the first milk you are producing for your baby.  Your belly button may stick out.  You may notice more swelling in your hands,  face, or ankles.  You may have increased tingling or numbness in your hands, arms, and legs. The skin on your belly may also feel numb.  You may feel short of breath because of your expanding uterus.  You may have more problems sleeping. This can be caused by the size of your belly, increased need to urinate, and an increase in your body's metabolism.  You may notice the fetus "dropping," or moving lower in your abdomen (lightening).  You may have increased vaginal discharge.  You may notice your joints feel loose and you may have pain around your pelvic bone.  What to expect at prenatal visits You will have prenatal exams every 2 weeks until week 36. Then you will have weekly prenatal exams. During a routine prenatal visit:  You will be weighed to make sure you and the baby are growing normally.  Your blood pressure will be taken.  Your abdomen will be measured to track your baby's growth.  The fetal heartbeat will be listened to.  Any test results from the previous visit will be discussed.  You may have a cervical check near your due date to see if your cervix has softened or thinned (effaced).  You will be tested for Group B streptococcus. This happens between 35 and 37 weeks.  Your health care provider may ask you:  What your birth plan is.  How you are feeling.  If you are feeling the baby move.  If you have had   any abnormal symptoms, such as leaking fluid, bleeding, severe headaches, or abdominal cramping.  If you are using any tobacco products, including cigarettes, chewing tobacco, and electronic cigarettes.  If you have any questions.  Other tests or screenings that may be performed during your third trimester include:  Blood tests that check for low iron levels (anemia).  Fetal testing to check the health, activity level, and growth of the fetus. Testing is done if you have certain medical conditions or if there are problems during the  pregnancy.  Nonstress test (NST). This test checks the health of your baby to make sure there are no signs of problems, such as the baby not getting enough oxygen. During this test, a belt is placed around your belly. The baby is made to move, and its heart rate is monitored during movement.  What is false labor? False labor is a condition in which you feel small, irregular tightenings of the muscles in the womb (contractions) that usually go away with rest, changing position, or drinking water. These are called Braxton Hicks contractions. Contractions may last for hours, days, or even weeks before true labor sets in. If contractions come at regular intervals, become more frequent, increase in intensity, or become painful, you should see your health care provider. What are the signs of labor?  Abdominal cramps.  Regular contractions that start at 10 minutes apart and become stronger and more frequent with time.  Contractions that start on the top of the uterus and spread down to the lower abdomen and back.  Increased pelvic pressure and dull back pain.  A watery or bloody mucus discharge that comes from the vagina.  Leaking of amniotic fluid. This is also known as your "water breaking." It could be a slow trickle or a gush. Let your health care provider know if it has a color or strange odor. If you have any of these signs, call your health care provider right away, even if it is before your due date. Follow these instructions at home: Medicines  Follow your health care provider's instructions regarding medicine use. Specific medicines may be either safe or unsafe to take during pregnancy.  Take a prenatal vitamin that contains at least 600 micrograms (mcg) of folic acid.  If you develop constipation, try taking a stool softener if your health care provider approves. Eating and drinking  Eat a balanced diet that includes fresh fruits and vegetables, whole grains, good sources of protein  such as meat, eggs, or tofu, and low-fat dairy. Your health care provider will help you determine the amount of weight gain that is right for you.  Avoid raw meat and uncooked cheese. These carry germs that can cause birth defects in the baby.  If you have low calcium intake from food, talk to your health care provider about whether you should take a daily calcium supplement.  Eat four or five small meals rather than three large meals a day.  Limit foods that are high in fat and processed sugars, such as fried and sweet foods.  To prevent constipation: ? Drink enough fluid to keep your urine clear or pale yellow. ? Eat foods that are high in fiber, such as fresh fruits and vegetables, whole grains, and beans. Activity  Exercise only as directed by your health care provider. Most women can continue their usual exercise routine during pregnancy. Try to exercise for 30 minutes at least 5 days a week. Stop exercising if you experience uterine contractions.  Avoid heavy   lifting.  Do not exercise in extreme heat or humidity, or at high altitudes.  Wear low-heel, comfortable shoes.  Practice good posture.  You may continue to have sex unless your health care provider tells you otherwise. Relieving pain and discomfort  Take frequent breaks and rest with your legs elevated if you have leg cramps or low back pain.  Take warm sitz baths to soothe any pain or discomfort caused by hemorrhoids. Use hemorrhoid cream if your health care provider approves.  Wear a good support bra to prevent discomfort from breast tenderness.  If you develop varicose veins: ? Wear support pantyhose or compression stockings as told by your healthcare provider. ? Elevate your feet for 15 minutes, 3-4 times a day. Prenatal care  Write down your questions. Take them to your prenatal visits.  Keep all your prenatal visits as told by your health care provider. This is important. Safety  Wear your seat belt at  all times when driving.  Make a list of emergency phone numbers, including numbers for family, friends, the hospital, and police and fire departments. General instructions  Avoid cat litter boxes and soil used by cats. These carry germs that can cause birth defects in the baby. If you have a cat, ask someone to clean the litter box for you.  Do not travel far distances unless it is absolutely necessary and only with the approval of your health care provider.  Do not use hot tubs, steam rooms, or saunas.  Do not drink alcohol.  Do not use any products that contain nicotine or tobacco, such as cigarettes and e-cigarettes. If you need help quitting, ask your health care provider.  Do not use any medicinal herbs or unprescribed drugs. These chemicals affect the formation and growth of the baby.  Do not douche or use tampons or scented sanitary pads.  Do not cross your legs for long periods of time.  To prepare for the arrival of your baby: ? Take prenatal classes to understand, practice, and ask questions about labor and delivery. ? Make a trial run to the hospital. ? Visit the hospital and tour the maternity area. ? Arrange for maternity or paternity leave through employers. ? Arrange for family and friends to take care of pets while you are in the hospital. ? Purchase a rear-facing car seat and make sure you know how to install it in your car. ? Pack your hospital bag. ? Prepare the baby's nursery. Make sure to remove all pillows and stuffed animals from the baby's crib to prevent suffocation.  Visit your dentist if you have not gone during your pregnancy. Use a soft toothbrush to brush your teeth and be gentle when you floss. Contact a health care provider if:  You are unsure if you are in labor or if your water has broken.  You become dizzy.  You have mild pelvic cramps, pelvic pressure, or nagging pain in your abdominal area.  You have lower back pain.  You have persistent  nausea, vomiting, or diarrhea.  You have an unusual or bad smelling vaginal discharge.  You have pain when you urinate. Get help right away if:  Your water breaks before 37 weeks.  You have regular contractions less than 5 minutes apart before 37 weeks.  You have a fever.  You are leaking fluid from your vagina.  You have spotting or bleeding from your vagina.  You have severe abdominal pain or cramping.  You have rapid weight loss or weight gain.    You have shortness of breath with chest pain.  You notice sudden or extreme swelling of your face, hands, ankles, feet, or legs.  Your baby makes fewer than 10 movements in 2 hours.  You have severe headaches that do not go away when you take medicine.  You have vision changes. Summary  The third trimester is from week 28 through week 40, months 7 through 9. The third trimester is a time when the unborn baby (fetus) is growing rapidly.  During the third trimester, your discomfort may increase as you and your baby continue to gain weight. You may have abdominal, leg, and back pain, sleeping problems, and an increased need to urinate.  During the third trimester your breasts will keep growing and they will continue to become tender. A yellow fluid (colostrum) may leak from your breasts. This is the first milk you are producing for your baby.  False labor is a condition in which you feel small, irregular tightenings of the muscles in the womb (contractions) that eventually go away. These are called Braxton Hicks contractions. Contractions may last for hours, days, or even weeks before true labor sets in.  Signs of labor can include: abdominal cramps; regular contractions that start at 10 minutes apart and become stronger and more frequent with time; watery or bloody mucus discharge that comes from the vagina; increased pelvic pressure and dull back pain; and leaking of amniotic fluid. This information is not intended to replace advice  given to you by your health care provider. Make sure you discuss any questions you have with your health care provider. Document Released: 11/16/2001 Document Revised: 04/29/2016 Document Reviewed: 01/23/2013 Elsevier Interactive Patient Education  2017 [employer]  

## 2018-05-14 NOTE — MAU Note (Addendum)
Patient c/o  +headache with "sinus pressure" Rating pain 4/10 +seeing "stars" +dizziness  +right upper abdominal pain Radiates to back Rating pain 4/10 Constant  +increase in swelling in legs and feet  +nausea  Denies Lof or VB   +FM  Has not taken any medications today  States has been communicating problems during ob visits and they have been watching them. Endorses that they have been getting worse.   +elevated BP at home 140/100 136/94 Checked by family EMT/fireman friend

## 2018-05-14 NOTE — MAU Provider Note (Signed)
History     CSN: [id number]  Arrival date and time: 05/14/18 1333   First Provider Initiated Contact with Patient 05/14/18 1453      Chief Complaint  Patient presents with  . Headache  . Hypertension   Cheryl Galvan is a 27 y.o. G1P0 at [redacted]w[redacted]d who presents today with headache, RUQ pain that radiates to her back, headache, visual disturbances that have been ongoing for about one month. She states that today she also had the feeling swelling in her sinuses. She had a family member take her blood pressure, and reports that it was 140/100, 136/94 at home. She reports that she has had contractions and they were stronger yesterday. They are not as painful today. She denies any VB or LOF. She reports normal fetal movement. She reports that she has GDM with this pregnancy, and a marginal cord insertion. Next office appt is 05/17/18.  Hypertension  This is a new problem. The current episode started today. The problem is unchanged. Associated symptoms include headaches. Associated agents: pregnancy  There are no known risk factors for coronary artery disease. Past treatments include nothing. There are no compliance problems.    Past Medical History:  Diagnosis Date  . Acne   . Allergy    sesonal allergy   . Amblyopia   . Asthma     no recent difficulties   . Depression    meds years ago  . Migraine    migraines - on it years ago    Past Surgical History:  Procedure Laterality Date  . WISDOM TOOTH EXTRACTION      Family History  Problem Relation Age of Onset  . Hypertension Mother   . Arthritis Mother   . Allergic rhinitis Mother   . Allergic rhinitis Sister   . Cancer Maternal Grandmother        colon  . Stroke Maternal Grandmother   . Hypertension Maternal Grandmother   . Cancer Maternal Grandfather        lung cancer  . Stroke Maternal Grandfather   . Cancer Paternal Grandfather        prostate  . Diabetes Paternal Grandfather   . Allergies Unknown   . Anemia Unknown    . Asthma Unknown   . Angioedema Neg Hx   . Eczema Neg Hx   . Immunodeficiency Neg Hx   . Urticaria Neg Hx     Social History   Tobacco Use  . Smoking status: Never Smoker  . Smokeless tobacco: Never Used  Substance Use Topics  . Alcohol use: Yes    Comment: occasionally; 1 drink weekly  . Drug use: No    Allergies: No Known Allergies  Medications Prior to Admission  Medication Sig Dispense Refill Last Dose  . ipratropium (ATROVENT) 0.06 % nasal spray Place 2 sprays into both nostrils 4 (four) times daily. 15 mL 0   . Prenatal Vit-Fe Fumarate-FA (MULTIVITAMIN-PRENATAL) 27-0.8 MG TABS tablet Take 1 tablet by mouth daily at 12 noon.   Taking  . ranitidine (ZANTAC) 150 MG capsule Take 150 mg by mouth 2 (two) times daily.   Taking    Review of Systems  Eyes: Positive for visual disturbance.  Gastrointestinal: Positive for abdominal pain (ruq pain that radiates to the back).  Genitourinary: Positive for vaginal discharge. Negative for vaginal bleeding.  Neurological: Positive for light-headedness and headaches.   Physical Exam   Blood pressure 124/79, pulse 84, temperature 98 F (36.7 C), temperature source Oral, resp. rate 18, SpO2  98 %.  Physical Exam  Nursing note and vitals reviewed. Constitutional: She is oriented to person, place, and time. She appears well-developed and well-nourished. No distress.  HENT:  Head: Normocephalic.  Cardiovascular: Normal rate.  Respiratory: Effort normal.  GI: Soft. There is no tenderness. There is no rebound.  Musculoskeletal: Normal range of motion.  Neurological: She is alert and oriented to person, place, and time.  Skin: Skin is warm.  Psychiatric: She has a normal mood and affect.   NST:  Baseline: 130 Variability: moderate Accels: 15x15 Decels: none Toco: initially about every 2-3 mins, but have spaced out over her time here in MAU  Results for orders placed or performed during the hospital encounter of 05/14/18 (from  the past 24 hour(s))  Urinalysis, Routine w reflex microscopic     Status: None   Collection Time: 05/14/18  1:54 PM  Result Value Ref Range   Color, Urine YELLOW YELLOW   APPearance CLEAR CLEAR   Specific Gravity, Urine 1.013 1.005 - 1.030   pH 7.0 5.0 - 8.0   Glucose, UA NEGATIVE NEGATIVE mg/dL   Hgb urine dipstick NEGATIVE NEGATIVE   Bilirubin Urine NEGATIVE NEGATIVE   Ketones, ur NEGATIVE NEGATIVE mg/dL   Protein, ur NEGATIVE NEGATIVE mg/dL   Nitrite NEGATIVE NEGATIVE   Leukocytes, UA NEGATIVE NEGATIVE  Protein / creatinine ratio, urine     Status: None   Collection Time: 05/14/18  1:54 PM  Result Value Ref Range   Creatinine, Urine 98.00 mg/dL   Total Protein, Urine 9 mg/dL   Protein Creatinine Ratio 0.09 0.00 - 0.15 mg/mg[Cre]  CBC     Status: None   Collection Time: 05/14/18  2:53 PM  Result Value Ref Range   WBC 10.5 4.0 - 10.5 K/uL   RBC 4.34 3.87 - 5.11 MIL/uL   Hemoglobin 12.5 12.0 - 15.0 g/dL   HCT 96.0 45.4 - 09.8 %   MCV 87.6 78.0 - 100.0 fL   MCH 28.8 26.0 - 34.0 pg   MCHC 33.5 30.0 - 36.0 g/dL   RDW 11.9 14.7 - 82.9 %   Platelets 202 150 - 400 K/uL  Comprehensive metabolic panel     Status: Abnormal   Collection Time: 05/14/18  2:53 PM  Result Value Ref Range   Sodium 139 135 - 145 mmol/L   Potassium 3.6 3.5 - 5.1 mmol/L   Chloride 112 (H) 101 - 111 mmol/L   CO2 18 (L) 22 - 32 mmol/L   Glucose, Bld 79 65 - 99 mg/dL   BUN 12 6 - 20 mg/dL   Creatinine, Ser 5.62 0.44 - 1.00 mg/dL   Calcium 8.9 8.9 - 13.0 mg/dL   Total Protein 6.5 6.5 - 8.1 g/dL   Albumin 2.9 (L) 3.5 - 5.0 g/dL   AST 15 15 - 41 U/L   ALT 11 (L) 14 - 54 U/L   Alkaline Phosphatase 95 38 - 126 U/L   Total Bilirubin 0.2 (L) 0.3 - 1.2 mg/dL   GFR calc non Af Amer >60 >60 mL/min   GFR calc Af Amer >60 >60 mL/min   Anion gap 9 5 - 15    MAU Course  Procedures  MDM 4:14 PM Consult with Dr. Billy Coast. Discussed patient's presenting complaint, and family member's B/P reading at home. Reviewed  her PE here on the unit, and all labs are normal, blood pressure is normal here. Dr. Billy Coast ok with DC home. Will provide reassurance to patient and Dr. Billy Coast recommends that  she not check B/P at home that patient to call the office or come to MAU if feeling symptoms.    Assessment and Plan   1. Dizziness   2. Headache in pregnancy, antepartum, third trimester   3. Costochondritis   4. [redacted] weeks gestation of pregnancy    DC home Comfort measures reviewed  3rd Trimester precautions  labor precautions  Fetal kick counts RX: none  Return to MAU as needed FU with OB as planned  Follow-up Information    Olivia Mackieaavon, Richard, MD Follow up.   Specialty:  Obstetrics and Gynecology Contact information: [address] [place] [id number] [phone]            Cheryl ShellerHeather Caralina Nop 05/14/2018, 2:55 PM

## 2018-05-14 NOTE — Progress Notes (Addendum)
G1 @ 37.[redacted] wksga. Here dt PIH symptoms. Ha, blurred vision "sees stars", nausea, and sinus problems and pain on right under breast which caused pt to check BP at home. Family mbr is EMT and fireman. Slight swelling on the feet wnl for pregn noted  BP stable while here. See flow sheet.   Ctx noted but pt just stated does not feel them.   Lab at bs  1500: provider at bs assessing.   1511: came out of pt's room and ordered to do VE on patient.   1512: VE ft/thick/high  1625: Provider states pt will be d/c home. awaiting discharge orders

## 2018-05-17 DIAGNOSIS — O24414 Gestational diabetes mellitus in pregnancy, insulin controlled: Secondary | ICD-10-CM | POA: Diagnosis not present

## 2018-05-17 DIAGNOSIS — Z3A37 37 weeks gestation of pregnancy: Secondary | ICD-10-CM | POA: Diagnosis not present

## 2018-05-19 DIAGNOSIS — F411 Generalized anxiety disorder: Secondary | ICD-10-CM | POA: Diagnosis not present

## 2018-05-23 DIAGNOSIS — O24414 Gestational diabetes mellitus in pregnancy, insulin controlled: Secondary | ICD-10-CM | POA: Diagnosis not present

## 2018-05-23 DIAGNOSIS — Z3A38 38 weeks gestation of pregnancy: Secondary | ICD-10-CM | POA: Diagnosis not present

## 2018-05-25 ENCOUNTER — Encounter (HOSPITAL_COMMUNITY): Payer: Self-pay

## 2018-05-25 ENCOUNTER — Other Ambulatory Visit: Payer: Self-pay | Admitting: Obstetrics and Gynecology

## 2018-05-25 ENCOUNTER — Other Ambulatory Visit: Payer: Self-pay

## 2018-05-25 ENCOUNTER — Inpatient Hospital Stay (HOSPITAL_COMMUNITY)
Admission: AD | Admit: 2018-05-25 | Discharge: 2018-05-29 | DRG: 805 | Disposition: A | Discharge status: Home / Self Care | Payer: BLUE CROSS/BLUE SHIELD | Attending: Obstetrics and Gynecology | Admitting: Obstetrics and Gynecology

## 2018-05-25 DIAGNOSIS — O4593 Premature separation of placenta, unspecified, third trimester: Secondary | ICD-10-CM | POA: Diagnosis present

## 2018-05-25 DIAGNOSIS — O24410 Gestational diabetes mellitus in pregnancy, diet controlled: Secondary | ICD-10-CM | POA: Diagnosis not present

## 2018-05-25 DIAGNOSIS — O9952 Diseases of the respiratory system complicating childbirth: Secondary | ICD-10-CM | POA: Diagnosis not present

## 2018-05-25 DIAGNOSIS — O24420 Gestational diabetes mellitus in childbirth, diet controlled: Principal | ICD-10-CM | POA: Diagnosis present

## 2018-05-25 DIAGNOSIS — O134 Gestational [pregnancy-induced] hypertension without significant proteinuria, complicating childbirth: Secondary | ICD-10-CM | POA: Diagnosis present

## 2018-05-25 DIAGNOSIS — O9962 Diseases of the digestive system complicating childbirth: Secondary | ICD-10-CM | POA: Diagnosis present

## 2018-05-25 DIAGNOSIS — D62 Acute posthemorrhagic anemia: Secondary | ICD-10-CM | POA: Diagnosis not present

## 2018-05-25 DIAGNOSIS — Z3490 Encounter for supervision of normal pregnancy, unspecified, unspecified trimester: Secondary | ICD-10-CM | POA: Diagnosis present

## 2018-05-25 DIAGNOSIS — K219 Gastro-esophageal reflux disease without esophagitis: Secondary | ICD-10-CM | POA: Diagnosis not present

## 2018-05-25 DIAGNOSIS — O9902 Anemia complicating childbirth: Secondary | ICD-10-CM | POA: Diagnosis not present

## 2018-05-25 DIAGNOSIS — O133 Gestational [pregnancy-induced] hypertension without significant proteinuria, third trimester: Secondary | ICD-10-CM | POA: Diagnosis not present

## 2018-05-25 DIAGNOSIS — J45909 Unspecified asthma, uncomplicated: Secondary | ICD-10-CM | POA: Diagnosis present

## 2018-05-25 DIAGNOSIS — R0603 Acute respiratory distress: Secondary | ICD-10-CM | POA: Diagnosis not present

## 2018-05-25 DIAGNOSIS — J4540 Moderate persistent asthma, uncomplicated: Secondary | ICD-10-CM | POA: Diagnosis present

## 2018-05-25 DIAGNOSIS — F325 Major depressive disorder, single episode, in full remission: Secondary | ICD-10-CM | POA: Diagnosis present

## 2018-05-25 DIAGNOSIS — Z3A39 39 weeks gestation of pregnancy: Secondary | ICD-10-CM

## 2018-05-25 DIAGNOSIS — Z23 Encounter for immunization: Secondary | ICD-10-CM | POA: Diagnosis not present

## 2018-05-25 DIAGNOSIS — O9081 Anemia of the puerperium: Secondary | ICD-10-CM | POA: Diagnosis not present

## 2018-05-25 DIAGNOSIS — R94120 Abnormal auditory function study: Secondary | ICD-10-CM | POA: Diagnosis not present

## 2018-05-25 DIAGNOSIS — O139 Gestational [pregnancy-induced] hypertension without significant proteinuria, unspecified trimester: Secondary | ICD-10-CM | POA: Diagnosis present

## 2018-05-25 DIAGNOSIS — Q5310 Unspecified undescended testicle, unilateral: Secondary | ICD-10-CM | POA: Diagnosis not present

## 2018-05-25 DIAGNOSIS — O24429 Gestational diabetes mellitus in childbirth, unspecified control: Secondary | ICD-10-CM | POA: Diagnosis not present

## 2018-05-25 DIAGNOSIS — Z3A38 38 weeks gestation of pregnancy: Secondary | ICD-10-CM | POA: Diagnosis not present

## 2018-05-25 DIAGNOSIS — Z412 Encounter for routine and ritual male circumcision: Secondary | ICD-10-CM | POA: Diagnosis not present

## 2018-05-25 HISTORY — DX: Anxiety disorder, unspecified: F41.9

## 2018-05-25 LAB — CBC
HEMATOCRIT: 35.5 % — AB (ref 36.0–46.0)
HEMOGLOBIN: 11.8 g/dL — AB (ref 12.0–15.0)
MCH: 29 pg (ref 26.0–34.0)
MCHC: 33.2 g/dL (ref 30.0–36.0)
MCV: 87.2 fL (ref 78.0–100.0)
Platelets: 198 10*3/uL (ref 150–400)
RBC: 4.07 MIL/uL (ref 3.87–5.11)
RDW: 14.5 % (ref 11.5–15.5)
WBC: 9.7 10*3/uL (ref 4.0–10.5)

## 2018-05-25 LAB — TYPE AND SCREEN
ABO/RH(D): O POS
Antibody Screen: NEGATIVE

## 2018-05-25 LAB — GLUCOSE, CAPILLARY: Glucose-Capillary: 148 mg/dL — ABNORMAL HIGH (ref 65–99)

## 2018-05-25 MED ORDER — TERBUTALINE SULFATE 1 MG/ML IJ SOLN
0.2500 mg | Freq: Once | INTRAMUSCULAR | Status: DC | PRN
  Filled 2018-05-25: qty 1

## 2018-05-25 MED ORDER — MISOPROSTOL 25 MCG QUARTER TABLET
25.0000 ug | ORAL_TABLET | ORAL | Status: DC | PRN
  Administered 2018-05-26 (×2): 25 ug via VAGINAL
  Filled 2018-05-25 (×3): qty 1

## 2018-05-25 MED ORDER — LACTATED RINGERS IV SOLN
INTRAVENOUS | Status: DC
  Administered 2018-05-25 – 2018-05-27 (×5): via INTRAVENOUS

## 2018-05-25 MED ORDER — ONDANSETRON HCL 4 MG/2ML IJ SOLN
4.0000 mg | Freq: Four times a day (QID) | INTRAMUSCULAR | Status: DC | PRN
  Administered 2018-05-26: 4 mg via INTRAVENOUS
  Filled 2018-05-25: qty 2

## 2018-05-25 MED ORDER — ACETAMINOPHEN 325 MG PO TABS
650.0000 mg | ORAL_TABLET | ORAL | Status: DC | PRN
  Administered 2018-05-26 (×2): 650 mg via ORAL
  Filled 2018-05-25 (×2): qty 2

## 2018-05-25 MED ORDER — SOD CITRATE-CITRIC ACID 500-334 MG/5ML PO SOLN
30.0000 mL | ORAL | Status: DC | PRN
  Administered 2018-05-26 (×2): 30 mL via ORAL
  Filled 2018-05-25 (×2): qty 15

## 2018-05-25 MED ORDER — LACTATED RINGERS IV SOLN: 500.0000 mL | INTRAVENOUS | Status: DC | PRN

## 2018-05-26 ENCOUNTER — Encounter (HOSPITAL_COMMUNITY): Payer: Self-pay | Admitting: *Deleted

## 2018-05-26 ENCOUNTER — Inpatient Hospital Stay (HOSPITAL_COMMUNITY): Payer: BLUE CROSS/BLUE SHIELD | Admitting: Anesthesiology

## 2018-05-26 DIAGNOSIS — O139 Gestational [pregnancy-induced] hypertension without significant proteinuria, unspecified trimester: Secondary | ICD-10-CM | POA: Diagnosis present

## 2018-05-26 DIAGNOSIS — O24410 Gestational diabetes mellitus in pregnancy, diet controlled: Secondary | ICD-10-CM | POA: Diagnosis present

## 2018-05-26 LAB — ABO/RH: ABO/RH(D): O POS

## 2018-05-26 LAB — RPR: RPR Ser Ql: NONREACTIVE

## 2018-05-26 LAB — GLUCOSE, CAPILLARY
GLUCOSE-CAPILLARY: 87 mg/dL (ref 65–99)
Glucose-Capillary: 114 mg/dL — ABNORMAL HIGH (ref 65–99)
Glucose-Capillary: 92 mg/dL (ref 65–99)

## 2018-05-26 MED ORDER — FENTANYL CITRATE (PF) 100 MCG/2ML IJ SOLN
100.0000 ug | Freq: Once | INTRAMUSCULAR | Status: AC
  Administered 2018-05-26: 100 ug via INTRAVENOUS

## 2018-05-26 MED ORDER — DIPHENHYDRAMINE HCL 50 MG/ML IJ SOLN
12.5000 mg | INTRAMUSCULAR | Status: DC | PRN
  Administered 2018-05-27: 12.5 mg via INTRAVENOUS
  Filled 2018-05-26: qty 1

## 2018-05-26 MED ORDER — PHENYLEPHRINE 40 MCG/ML (10ML) SYRINGE FOR IV PUSH (FOR BLOOD PRESSURE SUPPORT)
80.0000 ug | PREFILLED_SYRINGE | INTRAVENOUS | Status: DC | PRN
  Filled 2018-05-26: qty 5

## 2018-05-26 MED ORDER — LACTATED RINGERS IV SOLN: 500.0000 mL | Freq: Once | INTRAVENOUS | Status: DC

## 2018-05-26 MED ORDER — EPHEDRINE 5 MG/ML INJ
10.0000 mg | INTRAVENOUS | Status: DC | PRN
  Filled 2018-05-26: qty 2

## 2018-05-26 MED ORDER — FENTANYL 2.5 MCG/ML BUPIVACAINE 1/10 % EPIDURAL INFUSION (WH - ANES)
INTRAMUSCULAR | Status: AC
  Filled 2018-05-26: qty 100

## 2018-05-26 MED ORDER — FENTANYL CITRATE (PF) 100 MCG/2ML IJ SOLN
INTRAMUSCULAR | Status: AC
  Filled 2018-05-26: qty 2

## 2018-05-26 MED ORDER — LIDOCAINE HCL (PF) 1 % IJ SOLN
INTRAMUSCULAR | Status: DC | PRN
  Administered 2018-05-26 (×2): 5 mL via EPIDURAL

## 2018-05-26 MED ORDER — FENTANYL 2.5 MCG/ML BUPIVACAINE 1/10 % EPIDURAL INFUSION (WH - ANES)
14.0000 mL/h | INTRAMUSCULAR | Status: DC | PRN
  Administered 2018-05-26 – 2018-05-27 (×2): 14 mL/h via EPIDURAL
  Filled 2018-05-26: qty 100

## 2018-05-26 MED ORDER — OXYTOCIN 40 UNITS IN LACTATED RINGERS INFUSION - SIMPLE MED
1.0000 m[IU]/min | INTRAVENOUS | Status: DC
  Administered 2018-05-26: 10 m[IU]/min via INTRAVENOUS
  Administered 2018-05-26: 2 m[IU]/min via INTRAVENOUS
  Administered 2018-05-26: 22 m[IU]/min via INTRAVENOUS
  Filled 2018-05-26 (×2): qty 1000

## 2018-05-26 MED ORDER — PHENYLEPHRINE 40 MCG/ML (10ML) SYRINGE FOR IV PUSH (FOR BLOOD PRESSURE SUPPORT)
PREFILLED_SYRINGE | INTRAVENOUS | Status: AC
  Filled 2018-05-26: qty 20

## 2018-05-26 NOTE — Anesthesia Preprocedure Evaluation (Addendum)
Anesthesia Evaluation  Patient identified by MRN, date of birth, ID band Patient awake    Reviewed: Allergy & Precautions, H&P , NPO status , Patient's Chart, lab work & pertinent test results  History of Anesthesia Complications Negative for: history of anesthetic complications  Airway Mallampati: II  TM Distance: >3 FB Neck ROM: full    Dental no notable dental hx. (+) Teeth Intact   Pulmonary asthma ,    Pulmonary exam normal breath sounds clear to auscultation       Cardiovascular hypertension, Normal cardiovascular exam Rhythm:regular Rate:Normal     Neuro/Psych negative neurological ROS  negative psych ROS   GI/Hepatic negative GI ROS, Neg liver ROS,   Endo/Other  diabetes, Gestational  Renal/GU negative Renal ROS  negative genitourinary   Musculoskeletal   Abdominal   Peds  Hematology negative hematology ROS (+)   Anesthesia Other Findings   Reproductive/Obstetrics (+) Pregnancy                            Anesthesia Physical Anesthesia Plan  ASA: II  Anesthesia Plan: Epidural   Post-op Pain Management:    Induction:   PONV Risk Score and Plan:   Airway Management Planned:   Additional Equipment:   Intra-op Plan:   Post-operative Plan:   Informed Consent: I have reviewed the patients History and Physical, chart, labs and discussed the procedure including the risks, benefits and alternatives for the proposed anesthesia with the patient or authorized representative who has indicated his/her understanding and acceptance.     Plan Discussed with:   Anesthesia Plan Comments:         Anesthesia Quick Evaluation

## 2018-05-26 NOTE — Anesthesia Procedure Notes (Signed)
Epidural Patient location during procedure: OB  Staffing Anesthesiologist: Linc Renne, MD Performed: anesthesiologist   Preanesthetic Checklist Completed: patient identified, site marked, surgical consent, pre-op evaluation, timeout performed, IV checked, risks and benefits discussed and monitors and equipment checked  Epidural Patient position: sitting Prep: DuraPrep Patient monitoring: heart rate, continuous pulse ox and blood pressure Approach: right paramedian Location: L3-L4 Injection technique: LOR saline  Needle:  Needle type: Tuohy  Needle gauge: 17 G Needle length: 9 cm and 9 Needle insertion depth: 6 cm Catheter type: closed end flexible Catheter size: 20 Guage Catheter at skin depth: 10 cm Test dose: negative  Assessment Events: blood not aspirated, injection not painful, no injection resistance, negative IV test and no paresthesia  Additional Notes Patient identified. Risks/Benefits/Options discussed with patient including but not limited to bleeding, infection, nerve damage, paralysis, failed block, incomplete pain control, headache, blood pressure changes, nausea, vomiting, reactions to medication both or allergic, itching and postpartum back pain. Confirmed with bedside nurse the patient's most recent platelet count. Confirmed with patient that they are not currently taking any anticoagulation, have any bleeding history or any family history of bleeding disorders. Patient expressed understanding and wished to proceed. All questions were answered. Sterile technique was used throughout the entire procedure. Please see nursing notes for vital signs. Test dose was given through epidural needle and negative prior to continuing to dose epidural or start infusion. Warning signs of high block given to the patient including shortness of breath, tingling/numbness in hands, complete motor block, or any concerning symptoms with instructions to call for help. Patient was given  instructions on fall risk and not to get out of bed. All questions and concerns addressed with instructions to call with any issues.     

## 2018-05-26 NOTE — H&P (Signed)
Cheryl Galvan is a 27 y.o. female presenting for IOL for GDM and newly diagnosed Gestational HTN. OB History    Gravida  1   Para      Term      Preterm      AB      Living        SAB      TAB      Ectopic      Multiple      Live Births             Past Medical History:  Diagnosis Date  . Acne   . Allergy    sesonal allergy   . Amblyopia   . Anxiety 2009  . Asthma     no recent difficulties   . Depression    meds years ago  . Migraine    migraines - on it years ago   Past Surgical History:  Procedure Laterality Date  . WISDOM TOOTH EXTRACTION     Family History: family history includes Alcohol abuse in her father; Allergic rhinitis in her mother and sister; Anxiety disorder in her mother; Cancer in her maternal grandfather, maternal grandmother, and paternal grandfather; Depression in her mother; Diabetes in her maternal grandmother; Drug abuse in her father; Heart disease in her unknown relative; Hypertension in her maternal grandmother and mother; Stroke in her maternal grandfather and maternal grandmother. Social History:  reports that she has never smoked. She has never used smokeless tobacco. She reports that she drank alcohol. She reports that she does not use drugs.     Maternal Diabetes: Yes:  Diabetes Type:  Diet controlled Genetic Screening: Normal Maternal Ultrasounds/Referrals: Normal Fetal Ultrasounds or other Referrals:  None Maternal Substance Abuse:  No Significant Maternal Medications:  None Significant Maternal Lab Results:  None Other Comments:  None  Review of Systems  Constitutional: Negative.   All other systems reviewed and are negative.  Maternal Medical History:  Contractions: Onset was less than 1 hour ago.   Frequency: rare.   Perceived severity is mild.    Fetal activity: Perceived fetal activity is normal.   Last perceived fetal movement was within the past hour.    Prenatal complications: PIH.   Prenatal  Complications - Diabetes: gestational.    Dilation: 1 Effacement (%): 50 Station: -2 Exam by:: Theone Murdoch, RN Blood pressure 108/67, pulse 82, temperature 98.3 F (36.8 C), temperature source Oral, resp. rate 17, height 5\' 6"  (1.676 m), weight 101.2 kg (223 lb). Maternal Exam:  Uterine Assessment: Contraction strength is mild.  Contraction frequency is rare.   Abdomen: Patient reports no abdominal tenderness. Fetal presentation: vertex  Introitus: Normal vulva. Normal vagina.  Ferning test: not done.  Nitrazine test: not done. Amniotic fluid character: not assessed.  Pelvis: questionable for delivery.   Cervix: Cervix evaluated by digital exam.     Physical Exam  Nursing note and vitals reviewed. Constitutional: She is oriented to person, place, and time. She appears well-developed and well-nourished.  HENT:  Head: Normocephalic and atraumatic.  Neck: Normal range of motion. Neck supple.  Cardiovascular: Normal rate and regular rhythm.  GI: Soft. Bowel sounds are normal.  Genitourinary: Vagina normal and uterus normal.  Musculoskeletal: Normal range of motion.  Neurological: She is alert and oriented to person, place, and time. She has normal reflexes.  Skin: Skin is warm and dry.  Psychiatric: She has a normal mood and affect.    Prenatal labs: ABO, Rh: --/--/O POS, O  POS Performed at [facility], [address] [place], [place] [address]  [phone](06/20 2250) Antibody: NEG (06/20 2250) Rubella:   RPR:    HBsAg:    HIV:    GBS:     Assessment/Plan: 39 week IUP GDM ? New onset Gestational HTN- Inc BP at home? IOL   Ronelle Smallman J 05/26/2018, 6:30 AM

## 2018-05-26 NOTE — Anesthesia Pain Management Evaluation Note (Addendum)
  CRNA Pain Management Visit Note  Patient: Cheryl Galvan, 27 y.o., female  "Hello I am a member of the anesthesia team at [facility]. We have an anesthesia team available at all times to provide care throughout the hospital, including epidural management and anesthesia for C-section. I don't know your plan for the delivery whether it a natural birth, water birth, IV sedation, nitrous supplementation, doula or epidural, but we want to meet your pain goals."   1.Was your pain managed to your expectations on prior hospitalizations?   No prior hospitalizations  2.What is your expectation for pain management during this hospitalization?     Labor support without medications, epidural if needed  3.How can we help you reach that goal? Support PRN  Record the patient's initial score and the patient's pain goal.   Pain: 8  Pain Goal: 10 [facility] wants you to be able to say your pain was always managed very well.  Jennelle HumanLisa B Tollie Canada 05/26/2018

## 2018-05-27 ENCOUNTER — Encounter (HOSPITAL_COMMUNITY): Payer: Self-pay | Admitting: Student

## 2018-05-27 LAB — GLUCOSE, CAPILLARY: Glucose-Capillary: 90 mg/dL (ref 65–99)

## 2018-05-27 MED ORDER — SIMETHICONE 80 MG PO CHEW: 80.0000 mg | CHEWABLE_TABLET | ORAL | Status: DC | PRN

## 2018-05-27 MED ORDER — LIDOCAINE HCL (PF) 1 % IJ SOLN
INTRAMUSCULAR | Status: AC
  Administered 2018-05-27: 30 mL
  Filled 2018-05-27: qty 30

## 2018-05-27 MED ORDER — OXYCODONE-ACETAMINOPHEN 5-325 MG PO TABS: 2.0000 | ORAL_TABLET | ORAL | Status: DC | PRN

## 2018-05-27 MED ORDER — ACETAMINOPHEN 325 MG PO TABS
650.0000 mg | ORAL_TABLET | ORAL | Status: DC | PRN
  Administered 2018-05-27 – 2018-05-29 (×3): 650 mg via ORAL
  Filled 2018-05-27 (×3): qty 2

## 2018-05-27 MED ORDER — ONDANSETRON HCL 4 MG PO TABS: 4.0000 mg | ORAL_TABLET | ORAL | Status: DC | PRN

## 2018-05-27 MED ORDER — IBUPROFEN 600 MG PO TABS
600.0000 mg | ORAL_TABLET | Freq: Four times a day (QID) | ORAL | Status: DC
  Administered 2018-05-27 – 2018-05-29 (×8): 600 mg via ORAL
  Filled 2018-05-27 (×9): qty 1

## 2018-05-27 MED ORDER — TETANUS-DIPHTH-ACELL PERTUSSIS 5-2.5-18.5 LF-MCG/0.5 IM SUSP: 0.5000 mL | Freq: Once | INTRAMUSCULAR | Status: DC

## 2018-05-27 MED ORDER — METHYLERGONOVINE MALEATE 0.2 MG/ML IJ SOLN
0.2000 mg | Freq: Once | INTRAMUSCULAR | Status: AC
  Administered 2018-05-27 (×2): 0.2 mg via INTRAMUSCULAR

## 2018-05-27 MED ORDER — PRENATAL MULTIVITAMIN CH
1.0000 | ORAL_TABLET | Freq: Every day | ORAL | Status: DC
  Administered 2018-05-28: 1 via ORAL
  Filled 2018-05-27 (×2): qty 1

## 2018-05-27 MED ORDER — ZOLPIDEM TARTRATE 5 MG PO TABS: 5.0000 mg | ORAL_TABLET | Freq: Every evening | ORAL | Status: DC | PRN

## 2018-05-27 MED ORDER — DIPHENHYDRAMINE HCL 25 MG PO CAPS: 25.0000 mg | ORAL_CAPSULE | Freq: Four times a day (QID) | ORAL | Status: DC | PRN

## 2018-05-27 MED ORDER — ONDANSETRON HCL 4 MG/2ML IJ SOLN: 4.0000 mg | INTRAMUSCULAR | Status: DC | PRN

## 2018-05-27 MED ORDER — SENNOSIDES-DOCUSATE SODIUM 8.6-50 MG PO TABS
2.0000 | ORAL_TABLET | ORAL | Status: DC
  Administered 2018-05-27 – 2018-05-28 (×2): 2 via ORAL
  Filled 2018-05-27 (×2): qty 2

## 2018-05-27 MED ORDER — METHYLERGONOVINE MALEATE 0.2 MG PO TABS: 0.2000 mg | ORAL_TABLET | ORAL | Status: DC | PRN

## 2018-05-27 MED ORDER — OXYCODONE-ACETAMINOPHEN 5-325 MG PO TABS: 1.0000 | ORAL_TABLET | ORAL | Status: DC | PRN

## 2018-05-27 MED ORDER — COCONUT OIL OIL
1.0000 "application " | TOPICAL_OIL | Status: DC | PRN
  Administered 2018-05-28: 1 via TOPICAL
  Filled 2018-05-27: qty 120

## 2018-05-27 MED ORDER — BENZOCAINE-MENTHOL 20-0.5 % EX AERO
1.0000 "application " | INHALATION_SPRAY | CUTANEOUS | Status: DC | PRN
  Administered 2018-05-27: 1 via TOPICAL
  Filled 2018-05-27: qty 56

## 2018-05-27 MED ORDER — WITCH HAZEL-GLYCERIN EX PADS: 1.0000 "application " | MEDICATED_PAD | CUTANEOUS | Status: DC | PRN

## 2018-05-27 MED ORDER — METHYLERGONOVINE MALEATE 0.2 MG/ML IJ SOLN
INTRAMUSCULAR | Status: AC
  Administered 2018-05-27: 0.2 mg via INTRAMUSCULAR
  Filled 2018-05-27: qty 1

## 2018-05-27 MED ORDER — METHYLERGONOVINE MALEATE 0.2 MG/ML IJ SOLN: 0.2000 mg | Freq: Once | INTRAMUSCULAR | Status: DC

## 2018-05-27 MED ORDER — METHYLERGONOVINE MALEATE 0.2 MG/ML IJ SOLN: 0.2000 mg | INTRAMUSCULAR | Status: DC | PRN

## 2018-05-27 MED ORDER — DIBUCAINE 1 % RE OINT: 1.0000 "application " | TOPICAL_OINTMENT | RECTAL | Status: DC | PRN

## 2018-05-27 NOTE — Progress Notes (Signed)
Cheryl AweBritany Galvan is a 27 y.o. G1P0 at 3259w1d by LMP admitted for induction of labor due to Gestational diabetes.  Subjective: Pushing well  Objective: BP 105/83   Pulse (!) 115   Temp 99 F (37.2 C)   Resp 16   Ht 5\' 6"  (1.676 m)   Wt 101.2 kg (223 lb)   SpO2 98%   BMI 35.99 kg/m  No intake/output data recorded. Total I/O In: -  Out: 1100 [Urine:1100]  FHT:  FHR: 145 bpm, variability: moderate,  accelerations:  Present,  decelerations:  Absent UC:   regular, every 2 minutes SVE:   Complete/+3  Labs: Lab Results  Component Value Date   WBC 9.7 05/25/2018   HGB 11.8 (L) 05/25/2018   HCT 35.5 (L) 05/25/2018   MCV 87.2 05/25/2018   PLT 198 05/25/2018    Assessment / Plan: Induction of labor due to gestational diabetes,  progressing well on pitocin  Labor: Progressing normally Preeclampsia:  no signs or symptoms of toxicity and labs stable Fetal Wellbeing:  Category I Pain Control:  Epidural I/D:  n/a Anticipated MOD:  NSVD  Cheryl Galvan J 05/27/2018, 5:24 AM

## 2018-05-27 NOTE — Anesthesia Postprocedure Evaluation (Signed)
Anesthesia Post Note  Patient: Animal nutritionistBritany Galvan  Procedure(s) Performed: AN AD HOC LABOR EPIDURAL     Patient location during evaluation: Mother Baby Anesthesia Type: Epidural Level of consciousness: awake and alert Pain management: pain level controlled Vital Signs Assessment: post-procedure vital signs reviewed and stable Respiratory status: spontaneous breathing, nonlabored ventilation and respiratory function stable Cardiovascular status: stable Postop Assessment: no headache, no backache, epidural receding, no apparent nausea or vomiting, patient able to bend at knees, able to ambulate and adequate PO intake Anesthetic complications: no    Last Vitals:  Vitals:   05/27/18 0601 05/27/18 0739  BP: (!) 120/101 116/80  Pulse:  (!) 104  Resp:    Temp:  37 C  SpO2:      Last Pain:  Vitals:   05/27/18 0739  TempSrc: Oral  PainSc:    Pain Goal: Patients Stated Pain Goal: 7 (05/26/18 0729)               Laban EmperorMalinova,Arthur Speagle Hristova

## 2018-05-27 NOTE — Lactation Note (Signed)
This note was copied from a baby's chart. Lactation Consultation Note  Patient Name: Cheryl Galvan JYNWG'NToday's Date: 05/27/2018 Reason for consult: Initial assessment;Primapara;1st time breastfeeding;Term;Other (Comment)(NICU transient)  9 hours old FT female, NICU transient who is being exclusively BF by his mother, she's a P1. She has a Medela DEBP at home. Mom self reported having a hard time latching baby on since birth, baby was nursing when entering the room, RN was already helping mom with lactation and baby had a big wide open mouth and was sucking vigorously STS in the football position. Baby fed for 30 minutes on the left breast, mom burped baby after feeding. LC noted an indentation on baby's head after feeding but RN confirmed is just fluid due to swelling.  RN was very proactive and gave mom breast shells for areola edema and a hand pump. LC noted baby has a large are of bruising on the head, spoke to mom about pumping with a DEBP in order to supplement baby with her EBM since he's at A higher risk for jaundice but mom said she'll think about it and let her RN know. In the meantime mom will be hand expressing or using her hand pump to get any amount of EBM and give it to the baby. Parents are aware baby needs to get any extra amount of breastmilk available to minimize the risk of jaundice.  Encourage mom to feed baby STS 8-12 times/24 hours or sooner if feeding cues are present. She'll hand express or pump and spoon feed or finger feed baby any amount of EBM she may get after every feeding. Reviewed BF brochure, BF resources and feeding diary, both parents are aware of LC services and will call PRN.  Maternal Data Formula Feeding for Exclusion: No Has patient been taught Hand Expression?: Yes Does the patient have breastfeeding experience prior to this delivery?: No  Feeding Feeding Type: Breast Fed Length of feed: 30 min  LATCH Score Latch: Grasps breast easily, tongue down, lips  flanged, rhythmical sucking.  Audible Swallowing: Spontaneous and intermittent  Type of Nipple: Everted at rest and after stimulation  Comfort (Breast/Nipple): Soft / non-tender  Hold (Positioning): Assistance needed to correctly position infant at breast and maintain latch.  LATCH Score: 9  Interventions Interventions: Breast feeding basics reviewed;Assisted with latch;Skin to skin;Breast massage;Hand express;Breast compression;Adjust position;Support pillows;Position options;Shells;Hand pump  Lactation Tools Discussed/Used Tools: Shells;Pump Breast pump type: Manual WIC Program: No Pump Review: Setup, frequency, and cleaning Initiated by:: RN Date initiated:: 05/27/18   Consult Status Consult Status: Follow-up Date: 05/28/18 Follow-up type: In-patient    Frank Novelo Venetia ConstableS Duquan Gillooly 05/27/2018, 2:05 PM

## 2018-05-27 NOTE — Progress Notes (Addendum)
Cheryl AweBritany Galvan is a 27 y.o. G1P0 at 6968w1d by LMP admitted for induction of labor due to Gestational diabetes.  Subjective: Comfortable with epidural  Objective: BP 105/83   Pulse (!) 115   Temp 99 F (37.2 C)   Resp 16   Ht 5\' 6"  (1.676 m)   Wt 101.2 kg (223 lb)   SpO2 98%   BMI 35.99 kg/m  No intake/output data recorded. Total I/O In: -  Out: 1100 [Urine:1100]  FHT:  FHR: 145 bpm, variability: moderate,  accelerations:  Present,  decelerations:  Absent UC:   regular, every 2 minutes SVE:   4/80/0 IUPC placed without difficulty in posterior location. Anterior placenta noted  Labs: Lab Results  Component Value Date   WBC 9.7 05/25/2018   HGB 11.8 (L) 05/25/2018   HCT 35.5 (L) 05/25/2018   MCV 87.2 05/25/2018   PLT 198 05/25/2018    Assessment / Plan: Induction of labor due to gestational diabetes,  progressing well on pitocin  Labor: Progressing normally Preeclampsia:  no signs or symptoms of toxicity and labs stable Fetal Wellbeing:  Category I Pain Control:  Epidural I/D:  n/a Anticipated MOD:  NSVD  Katharina Jehle J 05/27/2018, 5:26 AM

## 2018-05-28 ENCOUNTER — Encounter (HOSPITAL_COMMUNITY): Payer: Self-pay | Admitting: Student

## 2018-05-28 DIAGNOSIS — O9902 Anemia complicating childbirth: Secondary | ICD-10-CM | POA: Diagnosis not present

## 2018-05-28 LAB — CBC
HCT: 28.6 % — ABNORMAL LOW (ref 36.0–46.0)
HEMOGLOBIN: 9.7 g/dL — AB (ref 12.0–15.0)
MCH: 29.6 pg (ref 26.0–34.0)
MCHC: 33.9 g/dL (ref 30.0–36.0)
MCV: 87.2 fL (ref 78.0–100.0)
PLATELETS: 143 10*3/uL — AB (ref 150–400)
RBC: 3.28 MIL/uL — AB (ref 3.87–5.11)
RDW: 14.6 % (ref 11.5–15.5)
WBC: 11.9 10*3/uL — ABNORMAL HIGH (ref 4.0–10.5)

## 2018-05-28 MED ORDER — MAGNESIUM OXIDE 400 (241.3 MG) MG PO TABS
400.0000 mg | ORAL_TABLET | Freq: Every day | ORAL | Status: DC
  Administered 2018-05-28 – 2018-05-29 (×2): 400 mg via ORAL
  Filled 2018-05-28 (×3): qty 1

## 2018-05-28 MED ORDER — POLYSACCHARIDE IRON COMPLEX 150 MG PO CAPS
150.0000 mg | ORAL_CAPSULE | Freq: Every day | ORAL | Status: DC
  Administered 2018-05-28 – 2018-05-29 (×2): 150 mg via ORAL
  Filled 2018-05-28 (×3): qty 1

## 2018-05-28 NOTE — Progress Notes (Signed)
CSW received consult for MOB due to history of anxiety and depression. CSW met with MOB and newborn Lurena Joiner at bedside to discuss. MOB reports being asymptomatic for a long time. MOB is not currently on any psychotropic medications and doesn't feel she needs any. CSW educated MOB on baby blues period versus postpartum depression. MOB did not have questions for CSW. CSW encouraged MOB to reach out if questions or needs arise, MOB in agreement.  Madilyn Fireman, MSW, [place] Social Worker [facility] [phone]

## 2018-05-28 NOTE — Progress Notes (Signed)
PPD #1 SVD w/ midline epis Boy "Cheryl Galvan"  S:  Reports feeling very good             Tolerating po/ No nausea or vomiting             Bleeding is light, moderate, no clots             Pain controlled with acetaminophen and ibuprofen (OTC)             Up ad lib / ambulatory / voiding w/o difficulty  Newborn Breast / Circumcision Completed 05/28/18 by Dr. Billy Coastaavon  O:    VS: BP 112/69 (BP Location: Right Arm)   Pulse 82   Temp 97.7 F (36.5 C) (Axillary)   Resp 18   LABS:              Recent Labs    05/25/18 2250 05/28/18 0541  WBC 9.7 11.9*  HGB 11.8* 9.7*  PLT 198 143*               Blood type: --/--/O POS, O POS Performed at [facility], [address] [place], [place] [address]  (06/20 2250)  Rubella: Immune (11/21 0000)                                Physical Exam:             Alert and oriented X3  Lungs: Clear and unlabored  Heart: regular rate and rhythm / no mumurs  Abdomen: soft, non-tender, non-distended              Fundus: firm, non-tender, U-2  Perineum: well-approximated  Lochia: small, not clots  Extremities: +1 edema BLE, no calf pain, cords, or tenderness    A/P:  27 y.o. G 1P1001 SVD w/ midline episiotomy      -Routine PP orders      -Perineal hygiene and comfort Anemia-acute blood loss      -Started Niferex 150mg  and Mag oxide 400mg  PO daily GHTN      -Normotensive GDM      -BG normal in labor, not affecting immediate pueperium      -Follow-up @ PP PRN GERD      -stable on Zantac during preg and Tums PRN Asthma      -stable on Atrovent Major depressive Disorder      -stable, not on meds      -EPDS prior to D/C and PP Anticipate D/C in AM  Cheryl Galvan, [facility] 05/28/2018 2:02 PM

## 2018-05-28 NOTE — Lactation Note (Signed)
This note was copied from a baby's chart. Lactation Consultation Note  Patient Name: Cheryl Galvan ZOXWR'UToday's Date: 05/28/2018 Reason for consult: Follow-up assessment;1st time breastfeeding;Primapara;Term  P1 mother whose infant is now 5641 hours old.  [facility] ;up [facility] with this [facility].  Mother was feeding baby on the left breast in the football hold as I arrived.  Baby was sucking in a good rhythmic pattern with flanged lips.  A few audible swallows were heard.  Mother stated she had no pain.  When he self released her nipple was rounded with no breakdown noted.  She burped him and he was still fussy so I suggested putting him to the right breast.  She latched him easily in the football hold and he continued to feed well.    Encouraged feeding 8-12 times/24 hours or more if he shows feeding cues.  Continue STS and breast compressions.  She will call for assistance as needed.  Father present and supportive.  No further questions/concerns at this time.     Maternal Data Formula Feeding for Exclusion: No Has patient been taught Hand Expression?: Yes Does the patient have breastfeeding experience prior to this delivery?: No  Feeding Feeding Type: Breast Fed Length of feed: 15 min(still feeding when I left the room)  LATCH Score Latch: Grasps breast easily, tongue down, lips flanged, rhythmical sucking.  Audible Swallowing: A few with stimulation  Type of Nipple: Everted at rest and after stimulation  Comfort (Breast/Nipple): Soft / non-tender  Hold (Positioning): No assistance needed to correctly position infant at breast.  LATCH Score: 9  Interventions Interventions: Breast feeding basics reviewed;Skin to skin;Breast massage;Breast compression  Lactation Tools Discussed/Used     Consult Status Consult Status: Follow-up Date: 05/29/18 Follow-up type: In-patient    [name] R Noheli Melder 05/28/2018, 10:14 PM

## 2018-05-29 ENCOUNTER — Encounter (HOSPITAL_COMMUNITY): Payer: Self-pay

## 2018-05-29 MED ORDER — IBUPROFEN 600 MG PO TABS: 600.0000 mg | ORAL_TABLET | Freq: Four times a day (QID) | ORAL | 0 refills | Status: DC

## 2018-05-29 MED ORDER — MAGNESIUM OXIDE 400 (241.3 MG) MG PO TABS: 400.0000 mg | ORAL_TABLET | Freq: Every day | ORAL | 2 refills | Status: DC

## 2018-05-29 MED ORDER — POLYSACCHARIDE IRON COMPLEX 150 MG PO CAPS: 150.0000 mg | ORAL_CAPSULE | Freq: Every day | ORAL | 3 refills | Status: DC

## 2018-05-29 NOTE — Discharge Summary (Signed)
Obstetric Discharge Summary   Patient Name: Cheryl Galvan DOB: 12/28/1990 MRN: [id number]  Date of Admission: 05/25/2018 Date of Discharge: 05/29/2018 Date of Delivery: 05/27/18 Gestational Age at Delivery: 151w3d  [facility] - Dr. Billy Coastaavon  Antepartum complications:  - A1GDM - ?PIH - Asthma - Major Depressive Disorder - not on meds Prenatal Labs:  ABO, Rh: --/--/O POS, O POS Performed at [facility], [address] [place], [place] [address]  (06/20 2250) Antibody: NEG (06/20 2250) Rubella:  Immune RPR:   NR HBsAg:   Negative HIV:   Negative GBS:   Negative  Admitting Diagnosis: IOL at term for A1GDM and ?GHTN  Secondary Diagnoses: Patient Active Problem List   Diagnosis Date Noted  . Maternal anemia, with delivery 05/28/2018  . SVD 05/27/2018 05/27/2018  . Second-degree perineal laceration, with delivery 05/27/2018  . GDM, class A1 05/26/2018  . Gestational hypertension 05/26/2018  . Encounter for planned induction of labor 05/25/2018  . Abnormal glucose tolerance test (GTT) during pregnancy, antepartum 03/24/2018  . Seasonal and perennial allergic rhinitis 08/31/2016  . GERD (gastroesophageal reflux disease) 10/18/2013  . IBS (irritable bowel syndrome) 10/18/2013  . Migraine with aura 10/03/2013  . 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 Moderate persistent asthma 08/15/2007    Augmentation: AROM, Cytotec, Pitocin  Complications: Placental Abruption, Postpartum hemorrhage   Date of Delivery: 05/27/18 Delivered By: Dr. Billy Coastaavon Delivery Type: spontaneous vaginal delivery Anesthesia: epidural Placenta: spontaneous Laceration: none Episiotomy: midline episiotomy   Newborn Data: Live born female  Birth Weight: 7 lb 2.6 oz (3250 g) APGAR: 1, 6, 8  Newborn Delivery   Birth date/time:  05/27/2018 04:57:00 Delivery type:  Vaginal, Spontaneous      Hospital/Postpartum Course  (Vaginal Delivery): Pt. Admitted for IOL for A1GDM  and questionable new onset PIH. She progressed with AROM, Cytotec, and Pitocin.  See notes for details.  See delivery note for delivery details.  Patient had an uncomplicated postpartum course.  By time of discharge on PPD#2, her pain was controlled on oral pain medications; she had appropriate lochia and was ambulating, voiding without difficulty and tolerating regular diet.  She was deemed stable for discharge to home.     Labs: CBC Latest Ref Rng & Units 05/28/2018 05/25/2018 05/14/2018  WBC 4.0 - 10.5 K/uL 11.9(H) 9.7 10.5  Hemoglobin 12.0 - 15.0 g/dL 1.9(J9.7(L) 11.8(L) 12.5  Hematocrit 36.0 - 46.0 % 28.6(L) 35.5(L) 38.0  Platelets 150 - 400 K/uL 143(L) 198 202   Conflict (See Lab Report): O POS/O POS Performed at [facility], [address] [place], [place] [address]   Physical exam:  BP 106/71 (BP Location: Right Arm)   Pulse 76   Temp 97.6 F (36.4 C) (Oral)   Resp 20   Ht 5\' 6"  (1.676 m)   Wt 101.2 kg (223 lb)   SpO2 100%   BMI 35.99 kg/m  General: alert and no distress Pulm: normal respiratory effort Lochia: appropriate Abdomen: soft, NT Uterine Fundus: firm, below umbilicus Perineum: healing well, no significant erythema, no significant edema Extremities: No evidence of DVT seen on physical exam. +2 pitting BLE  Disposition: stable, discharge to home Baby Feeding: breast milk Baby Disposition: home with mom  Contraception: unusre  Rh Immune globulin given: N/A Rubella vaccine given: N/A Tdap vaccine given in AP or PP setting: UTD Flu vaccine given in AP or PP setting: UTD   Plan:  Cheryl Galvan was discharged to home in good condition. [facility] in 6 weeks.  Discharge Instructions: Per After Visit Summary. Refer to After Visit Summary and [facility] discharge booklet  Activity: Advance as tolerated. Pelvic rest for 6 weeks.   Diet: Regular, Heart Healthy Discharge Medications: Allergies as of 05/29/2018   No Known  Allergies     Medication List    TAKE these medications   acetaminophen 325 MG tablet Commonly known as:  TYLENOL Take 650 mg by mouth every 6 (six) hours as needed for mild pain or headache.   calcium carbonate 500 MG chewable tablet Commonly known as:  TUMS - dosed in mg elemental calcium Chew 1 tablet by mouth 2 (two) times daily as needed for indigestion or heartburn.   ibuprofen 600 MG tablet Commonly known as:  ADVIL,MOTRIN Take 1 tablet (600 mg total) by mouth every 6 (six) hours.   ipratropium 0.06 % nasal spray Commonly known as:  ATROVENT Place 2 sprays into both nostrils 4 (four) times daily.   iron polysaccharides 150 MG capsule Commonly known as:  NIFEREX Take 1 capsule (150 mg total) by mouth daily. Start taking on:  05/30/2018   magnesium oxide 400 (241.3 Mg) MG tablet Commonly known as:  MAG-OX Take 1 tablet (400 mg total) by mouth daily. Start taking on:  05/30/2018   multivitamin-prenatal 27-0.8 MG Tabs tablet Take 1 tablet by mouth daily at 12 noon.   ranitidine 150 MG capsule Commonly known as:  ZANTAC Take 150 mg by mouth 2 (two) times daily.      Outpatient follow up:  Follow-up Information    Olivia Mackie, MD. Schedule an appointment as soon as possible for a visit in 6 week(s).   Specialty:  Obstetrics and Gynecology Why:  Postpartum visit Contact information: [address] [place] [address] [phone]           Signed:  Carlean Jews, MSN, CNM [facility]

## 2018-05-29 NOTE — Lactation Note (Signed)
This note was copied from a baby's chart. Lactation Consultation Note  Patient Name: Cheryl Jasmine AweBritany Kleiman WUJWJ'XToday's Date: 05/29/2018 Reason for consult: Follow-up assessment Mom reports that breastfeeding is going well.  Discussed milk coming to volume and engorgement treatment.  Lactation outpatient services and support reviewed and encouraged prn.  Maternal Data    Feeding Feeding Type: Breast Fed Length of feed: 25 min  LATCH Score                   Interventions    Lactation Tools Discussed/Used     Consult Status Consult Status: Complete Follow-up type: Call as needed    Huston FoleyMOULDEN, Keeghan Bialy S 05/29/2018, 10:17 AM

## 2018-05-29 NOTE — Progress Notes (Signed)
PPD #2, SVD, median episiotomy, baby boy "Franky MachoLuke"  S:  Reports feeling sore from repair   Also reports non-productive cough x 1 week             Tolerating po/ No nausea or vomiting / Denies dizziness or SOB             Bleeding is light             Pain controlled with Motrin             Up ad lib / ambulatory / voiding QS  Newborn breast feeding - going well per patient / Circumcision - completed 05/28/18  O:               VS: BP 106/71 (BP Location: Right Arm)   Pulse 76   Temp 97.6 F (36.4 C) (Oral)   Resp 20   Ht 5\' 6"  (1.676 m)   Wt 101.2 kg (223 lb)   SpO2 100%   BMI 35.99 kg/m    LABS:              Recent Labs    05/28/18 0541  WBC 11.9*  HGB 9.7*  PLT 143*               Blood type: --/--/O POS, O POS Performed at [facility], [address] [place], [place] [id number]  (06/20 2250)  Rubella: Immune (11/21 0000)                     I&O: Intake/Output    None                 Physical Exam:             Alert and oriented X3  Lungs: Clear and unlabored  Heart: regular rate and rhythm / no murmurs  Abdomen: soft, non-tender, non-distended              Fundus: firm, non-tender, U-3  Perineum: well approximated median episiotomy; no significant edema, erythema or ecchymosis  Lochia: small, no clots   Extremities: +2 pitting BLE edema, no calf pain or tenderness    A/P: PPD # 2, SVD  Median Episiotomy     - Advised to alternate with ice packs and sitz baths 3-5 times per day  ABL Anemia    - stable on oral FE  A1GDM   - plan for 2 hr GTT at 6 weeks PP  Asthma    - on Atrovent   Major Depressive Disorder    - stable, not on meds    - Follow PP closely   GHTN   - normotensive since delivery    - no neural s/s   Doing well - stable status  Routine post partum orders  Discharge home and instructions reviewed   F/u with Dr. Billy Coastaavon in 6 weeks   Carlean JewsMeredith Alfard Cochrane, MSN, CNM [facility]

## 2018-05-30 ENCOUNTER — Encounter (HOSPITAL_COMMUNITY): Payer: Self-pay | Admitting: *Deleted

## 2018-05-30 ENCOUNTER — Other Ambulatory Visit: Payer: Self-pay

## 2018-05-30 ENCOUNTER — Inpatient Hospital Stay (HOSPITAL_COMMUNITY)
Admission: AD | Admit: 2018-05-30 | Discharge: 2018-05-30 | Disposition: A | Discharge status: Home / Self Care | Payer: BLUE CROSS/BLUE SHIELD | Source: Ambulatory Visit | Attending: Obstetrics and Gynecology | Admitting: Obstetrics and Gynecology

## 2018-05-30 DIAGNOSIS — O9089 Other complications of the puerperium, not elsewhere classified: Secondary | ICD-10-CM

## 2018-05-30 DIAGNOSIS — R42 Dizziness and giddiness: Secondary | ICD-10-CM | POA: Diagnosis not present

## 2018-05-30 DIAGNOSIS — R197 Diarrhea, unspecified: Secondary | ICD-10-CM | POA: Diagnosis not present

## 2018-05-30 DIAGNOSIS — K521 Toxic gastroenteritis and colitis: Secondary | ICD-10-CM

## 2018-05-30 DIAGNOSIS — R5383 Other fatigue: Secondary | ICD-10-CM | POA: Insufficient documentation

## 2018-05-30 DIAGNOSIS — R51 Headache: Secondary | ICD-10-CM | POA: Diagnosis not present

## 2018-05-30 DIAGNOSIS — O909 Complication of the puerperium, unspecified: Secondary | ICD-10-CM | POA: Insufficient documentation

## 2018-05-30 DIAGNOSIS — R519 Headache, unspecified: Secondary | ICD-10-CM

## 2018-05-30 LAB — COMPREHENSIVE METABOLIC PANEL
ALBUMIN: 2.4 g/dL — AB (ref 3.5–5.0)
ALK PHOS: 95 U/L (ref 38–126)
ALT: 29 U/L (ref 0–44)
AST: 24 U/L (ref 15–41)
Anion gap: 7 (ref 5–15)
BUN: 10 mg/dL (ref 6–20)
CO2: 18 mmol/L — ABNORMAL LOW (ref 22–32)
Calcium: 8.2 mg/dL — ABNORMAL LOW (ref 8.9–10.3)
Chloride: 114 mmol/L — ABNORMAL HIGH (ref 98–111)
Creatinine, Ser: 0.62 mg/dL (ref 0.44–1.00)
GFR calc Af Amer: >60 mL/min (ref >60)
GFR calc non Af Amer: >60 mL/min (ref >60)
GLUCOSE: 101 mg/dL — AB (ref 70–99)
POTASSIUM: 3.7 mmol/L (ref 3.5–5.1)
Sodium: 139 mmol/L (ref 135–145)
TOTAL PROTEIN: 5.4 g/dL — AB (ref 6.5–8.1)
Total Bilirubin: 0.4 mg/dL (ref 0.3–1.2)

## 2018-05-30 LAB — URINALYSIS, ROUTINE W REFLEX MICROSCOPIC
BACTERIA UA: NONE SEEN
Bilirubin Urine: NEGATIVE
Glucose, UA: NEGATIVE mg/dL
Ketones, ur: NEGATIVE mg/dL
LEUKOCYTES UA: NEGATIVE
Nitrite: NEGATIVE
PROTEIN: NEGATIVE mg/dL
Specific Gravity, Urine: 1.005 (ref 1.005–1.030)
pH: 8 (ref 5.0–8.0)

## 2018-05-30 LAB — CBC WITH DIFFERENTIAL/PLATELET
BASOS ABS: 0 10*3/uL (ref 0.0–0.1)
BASOS PCT: 0 %
Eosinophils Absolute: 0.1 10*3/uL (ref 0.0–0.7)
Eosinophils Relative: 1 %
HEMATOCRIT: 26.6 % — AB (ref 36.0–46.0)
HEMOGLOBIN: 8.8 g/dL — AB (ref 12.0–15.0)
Lymphocytes Relative: 12 %
Lymphs Abs: 1.1 10*3/uL (ref 0.7–4.0)
MCH: 29 pg (ref 26.0–34.0)
MCHC: 33.1 g/dL (ref 30.0–36.0)
MCV: 87.8 fL (ref 78.0–100.0)
Monocytes Absolute: 0.2 10*3/uL (ref 0.1–1.0)
Monocytes Relative: 2 %
NEUTROS ABS: 7.8 10*3/uL — AB (ref 1.7–7.7)
NEUTROS PCT: 85 %
Platelets: 210 10*3/uL (ref 150–400)
RBC: 3.03 MIL/uL — AB (ref 3.87–5.11)
RDW: 14.4 % (ref 11.5–15.5)
WBC: 9.1 10*3/uL (ref 4.0–10.5)

## 2018-05-30 MED ORDER — KETOROLAC TROMETHAMINE 60 MG/2ML IM SOLN
60.0000 mg | Freq: Once | INTRAMUSCULAR | Status: AC
  Administered 2018-05-30: 60 mg via INTRAMUSCULAR
  Filled 2018-05-30: qty 2

## 2018-05-30 MED ORDER — LOPERAMIDE HCL 2 MG PO CAPS
4.0000 mg | ORAL_CAPSULE | Freq: Once | ORAL | Status: AC
  Administered 2018-05-30: 4 mg via ORAL
  Filled 2018-05-30: qty 2

## 2018-05-30 MED ORDER — LACTATED RINGERS IV BOLUS
1000.0000 mL | Freq: Once | INTRAVENOUS | Status: AC
  Administered 2018-05-30: 1000 mL via INTRAVENOUS

## 2018-05-30 NOTE — MAU Note (Signed)
Pt presents with c/o H/A, dizziness, chills, fatigue, and diarrhea.that began 0630.  Denies emesis, but constant dry heaving. Pt also reports "chest tightness" that began @ 0300. Pt s/p NVD on 05/27/2018 with placenta abruption & PPH. Didn't require a transfusion.

## 2018-05-30 NOTE — MAU Provider Note (Signed)
History     CSN: [id number]  Arrival date and time: 05/30/18 1027   First Provider Initiated Contact with Patient 05/30/18 1112      Chief Complaint  Patient presents with  . Nausea  . Dizziness  . Diarrhea  . Chills  . Headache   Cheryl Galvan is a 27 y.o. G1P0 who is 3 days S/P NSVD. She states that today she has diarrhea, dizziness, chest tightness, headache. She states that she had chest tightness this morning it has improved now. She denies shortness of breath or difficulty breathing. She states that she has had no bleeding since she went home.   Headache   This is a new problem. The pain is located in the temporal region. The pain does not radiate. The pain is at a severity of 4/10. Associated symptoms include dizziness. Pertinent negatives include no fever. Nothing aggravates the symptoms. She has tried NSAIDs for the symptoms. The treatment provided mild relief.  Diarrhea   This is a new problem. The current episode started in the past 7 days. The problem occurs 5 to 10 times per day. The problem has been gradually worsening. Diarrhea characteristics: soft stool  The patient states that diarrhea awakens her from sleep. Associated symptoms include chills and headaches. Pertinent negatives include no fever. Nothing aggravates the symptoms. There are no known risk factors. She has tried nothing for the symptoms.    Past Medical History:  Diagnosis Date  . Acne   . Allergy    sesonal allergy   . Amblyopia   . Anxiety 2009  . Asthma     no recent difficulties   . Depression    meds years ago  . Migraine    migraines - on it years ago    Past Surgical History:  Procedure Laterality Date  . WISDOM TOOTH EXTRACTION      Family History  Problem Relation Age of Onset  . Hypertension Mother   . Allergic rhinitis Mother   . Anxiety disorder Mother   . Depression Mother   . Alcohol abuse Father   . Drug abuse Father   . Allergic rhinitis Sister   . Cancer Maternal  Grandmother        colon  . Stroke Maternal Grandmother   . Diabetes Maternal Grandmother   . Hypertension Maternal Grandmother   . Cancer Maternal Grandfather        lung cancer  . Stroke Maternal Grandfather   . Cancer Paternal Grandfather        prostate  . Heart disease Unknown   . Angioedema Neg Hx   . Eczema Neg Hx   . Immunodeficiency Neg Hx   . Urticaria Neg Hx     Social History   Tobacco Use  . Smoking status: Never Smoker  . Smokeless tobacco: Never Used  Substance Use Topics  . Alcohol use: Not Currently    Comment: occasionally; 1 drink weekly  . Drug use: No    Allergies: No Known Allergies  Medications Prior to Admission  Medication Sig Dispense Refill Last Dose  . acetaminophen (TYLENOL) 325 MG tablet Take 650 mg by mouth every 6 (six) hours as needed for mild pain or headache.   Past Week at Unknown time  . calcium carbonate (TUMS - DOSED IN MG ELEMENTAL CALCIUM) 500 MG chewable tablet Chew 1 tablet by mouth 2 (two) times daily as needed for indigestion or heartburn.   Past Week at Unknown time  . ibuprofen (ADVIL,MOTRIN) 600  MG tablet Take 1 tablet (600 mg total) by mouth every 6 (six) hours. 30 tablet 0   . ipratropium (ATROVENT) 0.06 % nasal spray Place 2 sprays into both nostrils 4 (four) times daily. 15 mL 0   . iron polysaccharides (NIFEREX) 150 MG capsule Take 1 capsule (150 mg total) by mouth daily. 30 capsule 3   . magnesium oxide (MAG-OX) 400 (241.3 Mg) MG tablet Take 1 tablet (400 mg total) by mouth daily. 30 tablet 2   . Prenatal Vit-Fe Fumarate-FA (MULTIVITAMIN-PRENATAL) 27-0.8 MG TABS tablet Take 1 tablet by mouth daily at 12 noon.   05/25/2018 at Unknown time  . ranitidine (ZANTAC) 150 MG capsule Take 150 mg by mouth 2 (two) times daily.   05/25/2018 at Unknown time    Review of Systems  Constitutional: Positive for chills. Negative for fever.  Respiratory: Positive for chest tightness. Negative for shortness of breath.   Gastrointestinal:  Positive for diarrhea.  Neurological: Positive for dizziness and headaches.   Physical Exam   Blood pressure 133/84, pulse 76, temperature 98.4 F (36.9 C), temperature source Oral, resp. rate 20, height 5\' 6"  (1.676 m), weight 210 lb 12 oz (95.6 kg), SpO2 100 %, unknown if currently breastfeeding.  Physical Exam  Nursing note and vitals reviewed. Constitutional: She is oriented to person, place, and time. She appears well-developed and well-nourished. No distress.  HENT:  Head: Normocephalic.  Cardiovascular: Normal rate.  Respiratory: Effort normal.  GI: Soft. There is no tenderness. There is no rebound.  Neurological: She is alert and oriented to person, place, and time.  Skin: Skin is warm and dry.  Psychiatric: She has a normal mood and affect.   Results for orders placed or performed during the hospital encounter of 05/30/18 (from the past 24 hour(s))  Urinalysis, Routine w reflex microscopic     Status: Abnormal   Collection Time: 05/30/18 11:10 AM  Result Value Ref Range   Color, Urine STRAW (A) YELLOW   APPearance CLEAR CLEAR   Specific Gravity, Urine 1.005 1.005 - 1.030   pH 8.0 5.0 - 8.0   Glucose, UA NEGATIVE NEGATIVE mg/dL   Hgb urine dipstick MODERATE (A) NEGATIVE   Bilirubin Urine NEGATIVE NEGATIVE   Ketones, ur NEGATIVE NEGATIVE mg/dL   Protein, ur NEGATIVE NEGATIVE mg/dL   Nitrite NEGATIVE NEGATIVE   Leukocytes, UA NEGATIVE NEGATIVE   RBC / HPF 21-50 0 - 5 RBC/hpf   WBC, UA 0-5 0 - 5 WBC/hpf   Bacteria, UA NONE SEEN NONE SEEN   Mucus PRESENT   CBC with Differential/Platelet     Status: Abnormal   Collection Time: 05/30/18 11:43 AM  Result Value Ref Range   WBC 9.1 4.0 - 10.5 K/uL   RBC 3.03 (L) 3.87 - 5.11 MIL/uL   Hemoglobin 8.8 (L) 12.0 - 15.0 g/dL   HCT 40.926.6 (L) 81.136.0 - 91.446.0 %   MCV 87.8 78.0 - 100.0 fL   MCH 29.0 26.0 - 34.0 pg   MCHC 33.1 30.0 - 36.0 g/dL   RDW 78.214.4 95.611.5 - 21.315.5 %   Platelets 210 150 - 400 K/uL   Neutrophils Relative % 85 %    Neutro Abs 7.8 (H) 1.7 - 7.7 K/uL   Lymphocytes Relative 12 %   Lymphs Abs 1.1 0.7 - 4.0 K/uL   Monocytes Relative 2 %   Monocytes Absolute 0.2 0.1 - 1.0 K/uL   Eosinophils Relative 1 %   Eosinophils Absolute 0.1 0.0 - 0.7 K/uL   Basophils  Relative 0 %   Basophils Absolute 0.0 0.0 - 0.1 K/uL  Comprehensive metabolic panel     Status: Abnormal   Collection Time: 05/30/18 11:43 AM  Result Value Ref Range   Sodium 139 135 - 145 mmol/L   Potassium 3.7 3.5 - 5.1 mmol/L   Chloride 114 (H) 98 - 111 mmol/L   CO2 18 (L) 22 - 32 mmol/L   Glucose, Bld 101 (H) 70 - 99 mg/dL   BUN 10 6 - 20 mg/dL   Creatinine, Ser 1.61 0.44 - 1.00 mg/dL   Calcium 8.2 (L) 8.9 - 10.3 mg/dL   Total Protein 5.4 (L) 6.5 - 8.1 g/dL   Albumin 2.4 (L) 3.5 - 5.0 g/dL   AST 24 15 - 41 U/L   ALT 29 0 - 44 U/L   Alkaline Phosphatase 95 38 - 126 U/L   Total Bilirubin 0.4 0.3 - 1.2 mg/dL   GFR calc non Af Amer >60 >60 mL/min   GFR calc Af Amer >60 >60 mL/min   Anion gap 7 5 - 15     MAU Course  Procedures  MDM  1:39 PM Consult with Dr. Billy Coast. Discussed patient's symptoms. Given IV fluids, toradol, imodium here. Patient reports that she is feeling better at this time. Will DC home and have patient stop magnesium as this can contribute to soft stools, and FU in the office as planned.    Assessment and Plan   1. Postpartum care and examination   2. Headache in pregnancy, postpartum   3. Diarrhea due to drug   4. Fatigue, unspecified type    DC home Comfort measures reviewed  RX: none, patient advised to stop magnesium  Return to MAU as needed   Follow-up Information    Olivia Mackie, MD Follow up.   Specialty:  Obstetrics and Gynecology Contact information: [address] [place] [address] [phone]            Thressa Sheller 05/30/2018, 11:12 AM

## 2018-05-30 NOTE — Discharge Instructions (Signed)
Postpartum Depression and Baby Blues The postpartum period begins right after the birth of a baby. During this time, there is often a great amount of joy and excitement. It is also a time of many changes in the life of the parents. Regardless of how many times a mother gives birth, each child brings new challenges and dynamics to the family. It is not unusual to have feelings of excitement along with confusing shifts in moods, emotions, and thoughts. All mothers are at risk of developing postpartum depression or the "baby blues." These mood changes can occur right after giving birth, or they may occur many months after giving birth. The baby blues or postpartum depression can be mild or severe. Additionally, postpartum depression can go away rather quickly, or it can be a long-term condition. What are the causes? Raised hormone levels and the rapid drop in those levels are thought to be a main cause of postpartum depression and the baby blues. A number of hormones change during and after pregnancy. Estrogen and progesterone usually decrease right after the delivery of your baby. The levels of thyroid hormone and various cortisol steroids also rapidly drop. Other factors that play a role in these mood changes include major life events and genetics. What increases the risk? If you have any of the following risks for the baby blues or postpartum depression, know what symptoms to watch out for during the postpartum period. Risk factors that may increase the likelihood of getting the baby blues or postpartum depression include:  Having a personal or family history of depression.  Having depression while being pregnant.  Having premenstrual mood issues or mood issues related to oral contraceptives.  Having a lot of life stress.  Having marital conflict.  Lacking a social support network.  Having a baby with special needs.  Having health problems, such as diabetes.  What are the signs or  symptoms? Symptoms of baby blues include:  Brief changes in mood, such as going from extreme happiness to sadness.  Decreased concentration.  Difficulty sleeping.  Crying spells, tearfulness.  Irritability.  Anxiety.  Symptoms of postpartum depression typically begin within the first month after giving birth. These symptoms include:  Difficulty sleeping or excessive sleepiness.  Marked weight loss.  Agitation.  Feelings of worthlessness.  Lack of interest in activity or food.  Postpartum psychosis is a very serious condition and can be dangerous. Fortunately, it is rare. Displaying any of the following symptoms is cause for immediate medical attention. Symptoms of postpartum psychosis include:  Hallucinations and delusions.  Bizarre or disorganized behavior.  Confusion or disorientation.  How is this diagnosed? A diagnosis is made by an evaluation of your symptoms. There are no medical or lab tests that lead to a diagnosis, but there are various questionnaires that a health care provider may use to identify those with the baby blues, postpartum depression, or psychosis. Often, a screening tool called the Edinburgh Postnatal Depression Scale is used to diagnose depression in the postpartum period. How is this treated? The baby blues usually goes away on its own in 1-2 weeks. Social support is often all that is needed. You will be encouraged to get adequate sleep and rest. Occasionally, you may be given medicines to help you sleep. Postpartum depression requires treatment because it can last several months or longer if it is not treated. Treatment may include individual or group therapy, medicine, or both to address any social, physiological, and psychological factors that may play a role in the   depression. Regular exercise, a healthy diet, rest, and social support may also be strongly recommended. Postpartum psychosis is more serious and needs treatment right away.  Hospitalization is often needed. Follow these instructions at home:  Get as much rest as you can. Nap when the baby sleeps.  Exercise regularly. Some women find yoga and walking to be beneficial.  Eat a balanced and nourishing diet.  Do little things that you enjoy. Have a cup of tea, take a bubble bath, read your favorite magazine, or listen to your favorite music.  Avoid alcohol.  Ask for help with household chores, cooking, grocery shopping, or running errands as needed. Do not try to do everything.  Talk to people close to you about how you are feeling. Get support from your partner, family members, friends, or other new moms.  Try to stay positive in how you think. Think about the things you are grateful for.  Do not spend a lot of time alone.  Only take over-the-counter or prescription medicine as directed by your health care provider.  Keep all your postpartum appointments.  Let your health care provider know if you have any concerns. Contact a health care provider if: You are having a reaction to or problems with your medicine. Get help right away if:  You have suicidal feelings.  You think you may harm the baby or someone else. This information is not intended to replace advice given to you by your health care provider. Make sure you discuss any questions you have with your health care provider. Document Released: 08/26/2004 Document Revised: 04/29/2016 Document Reviewed: 09/03/2013 Elsevier Interactive Patient Education  2017 Elsevier Inc.  

## 2018-06-01 ENCOUNTER — Encounter: Payer: Self-pay | Admitting: Family Medicine

## 2018-06-01 ENCOUNTER — Other Ambulatory Visit: Payer: Self-pay

## 2018-06-01 ENCOUNTER — Ambulatory Visit: Payer: BLUE CROSS/BLUE SHIELD | Admitting: Family Medicine

## 2018-06-01 ENCOUNTER — Observation Stay (HOSPITAL_COMMUNITY)
Admission: AD | Admit: 2018-06-01 | Discharge: 2018-06-02 | Disposition: A | Discharge status: Home / Self Care | Payer: BLUE CROSS/BLUE SHIELD | Source: Ambulatory Visit | Attending: Obstetrics | Admitting: Obstetrics

## 2018-06-01 ENCOUNTER — Encounter (HOSPITAL_COMMUNITY): Payer: Self-pay | Admitting: *Deleted

## 2018-06-01 VITALS — BP 118/74 | HR 53 | Temp 98.5°F | Ht 66.0 in | Wt 205.6 lb

## 2018-06-01 DIAGNOSIS — R740 Nonspecific elevation of levels of transaminase and lactic acid dehydrogenase [LDH]: Secondary | ICD-10-CM | POA: Insufficient documentation

## 2018-06-01 DIAGNOSIS — R112 Nausea with vomiting, unspecified: Secondary | ICD-10-CM | POA: Diagnosis not present

## 2018-06-01 DIAGNOSIS — R079 Chest pain, unspecified: Secondary | ICD-10-CM | POA: Insufficient documentation

## 2018-06-01 DIAGNOSIS — R197 Diarrhea, unspecified: Secondary | ICD-10-CM | POA: Insufficient documentation

## 2018-06-01 DIAGNOSIS — R0602 Shortness of breath: Secondary | ICD-10-CM | POA: Diagnosis not present

## 2018-06-01 DIAGNOSIS — F419 Anxiety disorder, unspecified: Secondary | ICD-10-CM | POA: Diagnosis not present

## 2018-06-01 DIAGNOSIS — R1013 Epigastric pain: Secondary | ICD-10-CM | POA: Diagnosis not present

## 2018-06-01 DIAGNOSIS — D649 Anemia, unspecified: Secondary | ICD-10-CM | POA: Insufficient documentation

## 2018-06-01 DIAGNOSIS — R001 Bradycardia, unspecified: Secondary | ICD-10-CM | POA: Diagnosis not present

## 2018-06-01 DIAGNOSIS — O165 Unspecified maternal hypertension, complicating the puerperium: Secondary | ICD-10-CM | POA: Diagnosis present

## 2018-06-01 DIAGNOSIS — R51 Headache: Secondary | ICD-10-CM | POA: Insufficient documentation

## 2018-06-01 DIAGNOSIS — O135 Gestational [pregnancy-induced] hypertension without significant proteinuria, complicating the puerperium: Principal | ICD-10-CM | POA: Insufficient documentation

## 2018-06-01 DIAGNOSIS — O139 Gestational [pregnancy-induced] hypertension without significant proteinuria, unspecified trimester: Secondary | ICD-10-CM | POA: Diagnosis not present

## 2018-06-01 DIAGNOSIS — R5383 Other fatigue: Secondary | ICD-10-CM | POA: Diagnosis not present

## 2018-06-01 DIAGNOSIS — M791 Myalgia, unspecified site: Secondary | ICD-10-CM

## 2018-06-01 LAB — CBC WITH DIFFERENTIAL/PLATELET
Basophils Absolute: 0 10*3/uL (ref 0.0–0.1)
Basophils Relative: 0 %
EOS ABS: 0.1 10*3/uL (ref 0.0–0.7)
EOS PCT: 0 %
HCT: 30.9 % — ABNORMAL LOW (ref 36.0–46.0)
Hemoglobin: 10.1 g/dL — ABNORMAL LOW (ref 12.0–15.0)
LYMPHS ABS: 2.1 10*3/uL (ref 0.7–4.0)
Lymphocytes Relative: 17 %
MCH: 28.7 pg (ref 26.0–34.0)
MCHC: 32.7 g/dL (ref 30.0–36.0)
MCV: 87.8 fL (ref 78.0–100.0)
MONOS PCT: 5 %
Monocytes Absolute: 0.6 10*3/uL (ref 0.1–1.0)
Neutro Abs: 9.5 10*3/uL — ABNORMAL HIGH (ref 1.7–7.7)
Neutrophils Relative %: 78 %
PLATELETS: 284 10*3/uL (ref 150–400)
RBC: 3.52 MIL/uL — ABNORMAL LOW (ref 3.87–5.11)
RDW: 14.2 % (ref 11.5–15.5)
WBC: 12.2 10*3/uL — AB (ref 4.0–10.5)

## 2018-06-01 LAB — PROTEIN / CREATININE RATIO, URINE
Creatinine, Urine: 26 mg/dL
Protein Creatinine Ratio: 0.23 mg/mg{Cre} — ABNORMAL HIGH (ref 0.00–0.15)
Total Protein, Urine: 6 mg/dL

## 2018-06-01 LAB — URINALYSIS, ROUTINE W REFLEX MICROSCOPIC
BILIRUBIN URINE: NEGATIVE
Glucose, UA: NEGATIVE mg/dL
KETONES UR: NEGATIVE mg/dL
NITRITE: NEGATIVE
PH: 7 (ref 5.0–8.0)
Protein, ur: NEGATIVE mg/dL
Specific Gravity, Urine: 1.004 — ABNORMAL LOW (ref 1.005–1.030)

## 2018-06-01 LAB — COMPREHENSIVE METABOLIC PANEL
ALK PHOS: 118 U/L (ref 38–126)
ALT: 61 U/L — AB (ref 0–44)
AST: 32 U/L (ref 15–41)
Albumin: 2.8 g/dL — ABNORMAL LOW (ref 3.5–5.0)
Anion gap: 10 (ref 5–15)
BUN: 11 mg/dL (ref 6–20)
CHLORIDE: 110 mmol/L (ref 98–111)
CO2: 20 mmol/L — ABNORMAL LOW (ref 22–32)
CREATININE: 0.72 mg/dL (ref 0.44–1.00)
Calcium: 8.4 mg/dL — ABNORMAL LOW (ref 8.9–10.3)
GFR calc Af Amer: >60 mL/min (ref >60)
Glucose, Bld: 96 mg/dL (ref 70–99)
Potassium: 3.5 mmol/L (ref 3.5–5.1)
Sodium: 140 mmol/L (ref 135–145)
Total Bilirubin: 0.4 mg/dL (ref 0.3–1.2)
Total Protein: 6 g/dL — ABNORMAL LOW (ref 6.5–8.1)

## 2018-06-01 LAB — TSH: TSH: 0.868 u[IU]/mL (ref 0.350–4.500)

## 2018-06-01 LAB — T4, FREE: FREE T4: 1.06 ng/dL (ref 0.82–1.77)

## 2018-06-01 LAB — GLUCOSE, CAPILLARY: GLUCOSE-CAPILLARY: 83 mg/dL (ref 70–99)

## 2018-06-01 MED ORDER — KETOROLAC TROMETHAMINE 60 MG/2ML IM SOLN
60.0000 mg | Freq: Once | INTRAMUSCULAR | Status: AC
  Administered 2018-06-01: 60 mg via INTRAMUSCULAR
  Filled 2018-06-01: qty 2

## 2018-06-01 MED ORDER — SODIUM CHLORIDE 0.9 % IV SOLN
INTRAVENOUS | Status: DC
  Administered 2018-06-02: 05:00:00 via INTRAVENOUS

## 2018-06-01 MED ORDER — PANTOPRAZOLE SODIUM 40 MG PO TBEC
40.0000 mg | DELAYED_RELEASE_TABLET | Freq: Every day | ORAL | Status: DC
  Administered 2018-06-02: 40 mg via ORAL
  Filled 2018-06-01: qty 1

## 2018-06-01 MED ORDER — MENTHOL 3 MG MT LOZG: 1.0000 | LOZENGE | OROMUCOSAL | Status: DC | PRN

## 2018-06-01 MED ORDER — ONDANSETRON HCL 4 MG PO TABS
4.0000 mg | ORAL_TABLET | Freq: Four times a day (QID) | ORAL | Status: DC | PRN
  Administered 2018-06-02 (×2): 4 mg via ORAL
  Filled 2018-06-01 (×2): qty 1

## 2018-06-01 MED ORDER — GUAIFENESIN 100 MG/5ML PO SOLN
15.0000 mL | ORAL | Status: DC | PRN
  Filled 2018-06-01: qty 15

## 2018-06-01 MED ORDER — IBUPROFEN 600 MG PO TABS
600.0000 mg | ORAL_TABLET | Freq: Four times a day (QID) | ORAL | Status: DC | PRN
  Administered 2018-06-02: 600 mg via ORAL
  Filled 2018-06-01 (×2): qty 1

## 2018-06-01 MED ORDER — M.V.I. ADULT IV INJ
Freq: Once | INTRAVENOUS | Status: AC
  Administered 2018-06-01: 18:00:00 via INTRAVENOUS
  Filled 2018-06-01: qty 10

## 2018-06-01 MED ORDER — GI COCKTAIL ~~LOC~~
30.0000 mL | Freq: Once | ORAL | Status: AC
  Administered 2018-06-01: 30 mL via ORAL
  Filled 2018-06-01: qty 30

## 2018-06-01 MED ORDER — ZOLPIDEM TARTRATE 5 MG PO TABS
5.0000 mg | ORAL_TABLET | Freq: Once | ORAL | Status: AC
  Administered 2018-06-01: 5 mg via ORAL
  Filled 2018-06-01: qty 1

## 2018-06-01 MED ORDER — ONDANSETRON HCL 4 MG/2ML IJ SOLN: 4.0000 mg | Freq: Four times a day (QID) | INTRAMUSCULAR | Status: DC | PRN

## 2018-06-01 NOTE — MAU Note (Signed)
Pt PP 6 days and is having fatigue, weakness and pain in her chest that wraps to her back 10/10. Was at her PCP and they did EKG which was normal. Wanted a BNP and thyroid blood test done.

## 2018-06-01 NOTE — Progress Notes (Signed)
Pt admitted by Dr. Ernestina PennaFogleman for admission. report given to Jackson NorthKylie W., rnc. Pt transferred via WC to rm [id number]

## 2018-06-01 NOTE — H&P (Signed)
Chief complaint: Chest pain, bradycardia, epigastric pain, back pain, malaise and nausea  History present illness: 27 year old G1 P1 5 days postpartum from a spontaneous vaginal delivery after induction of labor forA1 GDM and newly diagnosed gestational hypertension. Prior to delivery patient did have one week of nonproductive cough.patient also has history of asthma and major depression which had been stable through the pregnancy and for which she is not currently on any medications.patient had been normotensive since delivery and never had a diagnosis of preeclampsia.  Patient presented 2 days ago to MAU for diarrhea dizziness chest tightness and headache.she had a normal exam at that time, received IV fluids Toradol and Imodium and was discharged in an improved condition. Patient states since then her diarrhea has gotten worse and she now has tightness in her epigastric area that radiates around her chest to her back bilaterally. Patient is very concerned about her pulse in the 50s which she has been monitoring with an apple watch. Patient does note the Zofran has helped mildly with her nausea but continues to complain of shortness of breath as if she can't get a deep breath.   Patient was seen by her primary care doctor today and had an EKG showing sinus bradycardia.he recommended checking basic labs versus going to MAU for further evaluation and patient opted to come here. Patient remains concerned that "no one is listening to her"  Patient notes no fevers. No sick contacts although her husband notes that he has also had some GI upset.  Patient notes headache that has been present mostly since delivery. She notes occasional white floaters,  last this morning.patient notes decreased appetite and husband is concerned about decreased oral intake.  Past Medical History:  Diagnosis Date  . Acne   . Allergy    sesonal allergy   . Amblyopia   . Anxiety 2009  . Asthma     no recent difficulties   .  Depression    meds years ago  . Migraine    migraines - on it years ago    Past Surgical History:  Procedure Laterality Date  . WISDOM TOOTH EXTRACTION      Physical exam: Vitals:   06/01/18 1846 06/01/18 1900 06/01/18 1911 06/01/18 1919  BP: (!) 143/97 (!) 143/85  140/76  Pulse: (!) 54 (!) 52  (!) 52  Resp:      Temp:      TempSrc:      SpO2:  100% 100%   Weight:       Gen.: Anxious in no distress Cardiovascular: Bradycardia, no murmur rub or gallop Pulmonary: Clear to auscultation bilaterally Abdomen: No right upper quadrant pain, fundusbelow umbilicus and appropriately tender Back: No costovertebral angle tenderness GU: Appropriate lochia on half Lower extremity: 1+ edema, 2+ DTR, no clonus  EKG: Sinus bradycardia CBC    Component Value Date/Time   WBC 12.2 (H) 06/01/2018 1712   RBC 3.52 (L) 06/01/2018 1712   HGB 10.1 (L) 06/01/2018 1712   HCT 30.9 (L) 06/01/2018 1712   PLT 284 06/01/2018 1712   MCV 87.8 06/01/2018 1712   MCV 85.7 02/26/2015 0955   MCH 28.7 06/01/2018 1712   MCHC 32.7 06/01/2018 1712   RDW 14.2 06/01/2018 1712   RDW 13.2 10/03/2013 0941   LYMPHSABS 2.1 06/01/2018 1712   LYMPHSABS 1.8 10/03/2013 0941   MONOABS 0.6 06/01/2018 1712   EOSABS 0.1 06/01/2018 1712   EOSABS 0.1 10/03/2013 0941   BASOSABS 0.0 06/01/2018 1712   BASOSABS 0.0  10/03/2013 0941    CMP     Component Value Date/Time   NA 140 06/01/2018 1712   NA 143 10/03/2013 0941   K 3.5 06/01/2018 1712   CL 110 06/01/2018 1712   CO2 20 (L) 06/01/2018 1712   GLUCOSE 96 06/01/2018 1712   BUN 11 06/01/2018 1712   BUN 11 10/03/2013 0941   CREATININE 0.72 06/01/2018 1712   CREATININE 0.71 05/20/2012 1042   CALCIUM 8.4 (L) 06/01/2018 1712   PROT 6.0 (L) 06/01/2018 1712   PROT 7.0 10/03/2013 0941   ALBUMIN 2.8 (L) 06/01/2018 1712   ALBUMIN 4.6 10/03/2013 0941   AST 32 06/01/2018 1712   ALT 61 (H) 06/01/2018 1712   ALKPHOS 118 06/01/2018 1712   BILITOT 0.4 06/01/2018 1712    GFRNONAA >60 06/01/2018 1712   GFRAA >60 06/01/2018 1712   Assessment and plan: 26 showed G1 P1 5 days postpartum with history of depression not currently treated, history of possible gestational hypertension and continued complaints ofepigastric and back pain, nausea, diarrhea, and shortness of breath. - Hypertension. Patient with mildly increased blood pressures but no clear signs or symptoms of preeclampsia. Will admit patient for postpartum hypertension and further evaluation. I suspect her blood pressures are due to anxiety and lack of sleep and will continue to monitor through the night. If blood pressures remain elevated we will more strongly consider diagnosis of preeclampsia and consider magnesium and antihypertensives. - Elevated ALT.This is likely due to her recent nausea and diarrhea rather than isolated lab abnormality of preeclampsia. Her AST and creatinine along with platelets and hemoglobin remain in normal limits. I discussed with patient this may reflect slight changes in her liver which are causing her epigastric and upper abdominal and back pain. Will follow these labs tomorrow. - epigastric pain. Likely due to elevated ALT and possibly due to GERD. Will start proton X. Patient has shown some response with a GI cocktail.  - Shortness of breath. Normal saturation. Reassurance given. This may be related to her elevated ALT.  - Anxiety. This is likely the driving factor for her multifactorial complaints. I also believe this is driving her blood pressure. Patient does accept and Ambien for sleep tonight but declines Ativan. Will continue to discuss the need for antidepressants.  40 minutes spent with patient discussing the above complaints. Attempted to give patient reassurance but overnight observation to follow on her status is the only thing that is giving her some reassurance. I feel that given she has been to the ER twice and her primary care doctor once in the past 3 days admission  for observation is warranted.  Lendon ColonelKelly A Adalina Dopson 06/01/2018 8:22 PM

## 2018-06-01 NOTE — Patient Instructions (Signed)
It was very nice to see you today!  Your EKG today is normal.  I want to check blood work today.  We will be checking for electrolyte abnormalities, anemia, gallbladder/pancreas/liver abnormalities, and hormone abnormalities.  Please continue using Zofran as needed.  Please take as much fluid as you are able to.  If your symptoms simply worsen, please go to the emergency department.  If you are unable to keep down any fluids with Zofran, you may need to get IV fluids at the emergency department.  Take care, Dr Jimmey RalphParker

## 2018-06-01 NOTE — Progress Notes (Signed)
   Subjective:  Cheryl Galvan is a 27 y.o. female who presents today for same-day appointment with a chief complaint of nausea.   HPI:  Nausea and vomiting, acute problem Symptoms started a few days ago.  Of note, patient is 5 days postpartum.  Associated symptoms include epigastric/lower chest pain that radiates to her flanks, diarrhea, and generalized malaise.  Patient's pregnancy course was complicated by a terminal placental abruption, gestational hypertension, and gestational diabetes.  Symptoms have worsened over the last few days.  She went to the MAU 2 days ago and was given IV fluids with some improvement in her symptoms.  She has been in contact with her OB/GYN office who gave her some Zofran today.  States that the Zofran has helped a little bit with her nausea, however the epigastric pain and diarrhea have persisted.  She has been able to tolerate a little bit of broth and crackers.  She was not able to keep down water this morning.  She has had some occasional shortness of breath.  She has also tried taking Imodium which is not significantly helped with her diarrhea.  She is currently breast-feeding.  Her abdominal pain is worse when bending over.  No reflux type symptoms.  ROS: Per HPI, otherwise a complete ROS was negative.   PMH: She reports that she has never smoked. She has never used smokeless tobacco. She reports that she drank alcohol. She reports that she does not use drugs.  Objective:  Physical Exam: BP 118/74 (BP Location: Left Arm, Patient Position: Sitting, Cuff Size: Normal)   Pulse (!) 53   Temp 98.5 F (36.9 C) (Oral)   Ht 5\' 6"  (1.676 m)   Wt 205 lb 9.6 oz (93.3 kg)   SpO2 97%   BMI 33.18 kg/m   Gen: NAD, ill appearing 27 year old sitting on exam table CV: RRR with no murmurs appreciated Pulm: NWOB, CTAB with no crackles, wheezes, or rhonchi GI: Normal bowel sounds present. Soft, Nontender, Nondistended. MSK: No CVA tenderness. 2+ pretibial edema  bilaterally  EKG: Sinus bradycardia. HR 49.  No signs of acute ischemia  Assessment/Plan:  Nausea/vomiting/chest pain Broad differential.  Concerning that patient has bradycardia today in setting of dehydration.  It is possible that this could all represent a viral GI illness, however less likely given that symptoms have continued to worsen over the past 5 days and additionally she does not have any compensatory tachycardia.  Discussed treatment options with patient.  Discussed blood work in the office including CBC, CMET, TSH, lipase, and BNP (to evaluate for anemia, electrolyte derangements, thyroid abnormalities, cardiomyopathy, and gallbladder/liver/pancreas pathologies) versus going to the MAU for further evaluation and management.  Patient and her husband opted to go to the MAU for further evaluation including blood work and possible IV fluids.   Time Spent: I spent >25 minutes face-to-face with the patient, with more than half spent on counseling for possible etiologies and management plan for her symptoms.  Katina Degreealeb M. Jimmey RalphParker, MD 06/01/2018 2:37 PM

## 2018-06-01 NOTE — MAU Provider Note (Signed)
History     CSN: [id number]  Arrival date and time: 06/01/18 1551   First Provider Initiated Contact with Patient 06/01/18 1640      Chief Complaint  Patient presents with  . Shortness of Breath  . Fatigue   HPI  Cheryl Galvan is a 27 y.o. G1P0 postpartum patient who presents to MAU with multiple complaints, including intermittent chest pain, general fatigue, nausea and vomiting not relieved by Zofran, and abnormal vitals. Patient is s/p SVD with midline episiotomy on 05/27/18. Uncomplicated postpartum recovery inpatient. Reports light lochia, denies perineal discomfort. Denies headache.   Patient was seen by Primary Care provider at 2pm today and offered lab draws. Patient elected to come to MAU based on the belief that she would see lab results faster than if they were drawn at clinic.  Intermittent chest pain Existing problem. States pain feels like "tightness" across front of chest, rating pain 7/10. No relieving factors or aggravating factors. States pain is worse than when she presented to MAU on 05/30/18.  General fatigue New problem. Patient states she has been experiencing significant increase in fatigue since being discharged from postpartum 05/29/18. States she is able to keep her feet up at home but concern for her physical symptoms is preventing her from sleeping.  Nausea/Vomiting Existing problem. Given Zofran by [facility] staff earlier today but states it has not helped. Reports four episodes of vomiting today, one episode yesterday with "almost constant" dry heaving.   OB History    Gravida  1   Para      Term      Preterm      AB      Living  1     SAB      TAB      Ectopic      Multiple      Live Births  1           Past Medical History:  Diagnosis Date  . Acne   . Allergy    sesonal allergy   . Amblyopia   . Anxiety 2009  . Asthma     no recent difficulties   . Depression    meds years ago  . Migraine    migraines - on it  years ago    Past Surgical History:  Procedure Laterality Date  . WISDOM TOOTH EXTRACTION      Family History  Problem Relation Age of Onset  . Hypertension Mother   . Allergic rhinitis Mother   . Anxiety disorder Mother   . Depression Mother   . Alcohol abuse Father   . Drug abuse Father   . Allergic rhinitis Sister   . Cancer Maternal Grandmother        colon  . Stroke Maternal Grandmother   . Diabetes Maternal Grandmother   . Hypertension Maternal Grandmother   . Cancer Maternal Grandfather        lung cancer  . Stroke Maternal Grandfather   . Cancer Paternal Grandfather        prostate  . Heart disease Unknown   . Angioedema Neg Hx   . Eczema Neg Hx   . Immunodeficiency Neg Hx   . Urticaria Neg Hx     Social History   Tobacco Use  . Smoking status: Never Smoker  . Smokeless tobacco: Never Used  Substance Use Topics  . Alcohol use: Not Currently    Comment: occasionally; 1 drink weekly  . Drug use: No  Allergies: No Known Allergies  Medications Prior to Admission  Medication Sig Dispense Refill Last Dose  . ferrous sulfate 325 (65 FE) MG EC tablet Take 325 mg by mouth 3 (three) times daily with meals.   05/31/2018 at Unknown time  . ondansetron (ZOFRAN) 4 MG tablet Take 4 mg by mouth every 8 (eight) hours as needed for nausea or vomiting.   06/01/2018 at Unknown time  . Prenatal Vit-Fe Fumarate-FA (MULTIVITAMIN-PRENATAL) 27-0.8 MG TABS tablet Take 1 tablet by mouth daily at 12 noon.   05/31/2018 at Unknown time    Review of Systems  Respiratory: Positive for chest tightness and shortness of breath.   Gastrointestinal: Positive for diarrhea, nausea and vomiting. Negative for abdominal pain and blood in stool.  Genitourinary: Positive for vaginal bleeding. Negative for difficulty urinating, dysuria, vaginal discharge and vaginal pain.  Skin:       Flat red rash visible on right shoulder, right ear, and across chest. Denies itching, patient states this is new  onset today and happens when she gets anxious  Neurological: Negative for headaches.  All other systems reviewed and are negative.  Physical Exam   Blood pressure (!) 134/98, pulse (!) 51, temperature 98.3 F (36.8 C), temperature source Oral, resp. rate 20, weight 206 lb (93.4 kg), SpO2 100 %, unknown if currently breastfeeding.  Physical Exam  Nursing note and vitals reviewed. Constitutional: She is oriented to person, place, and time. She appears well-developed and well-nourished.  Cardiovascular: Normal rate, regular rhythm, normal heart sounds and intact distal pulses.  Respiratory: Effort normal and breath sounds normal. No respiratory distress. She has no wheezes. She has no rales. She exhibits no tenderness.  GI: Soft. Bowel sounds are normal. She exhibits no distension and no mass. There is no tenderness. There is no rebound and no guarding.  Genitourinary: Vagina normal and uterus normal.  Genitourinary Comments: Normal rubra lochia visible on exam  Musculoskeletal: Normal range of motion.  Neurological: She is alert and oriented to person, place, and time. She has normal reflexes.  Skin: Skin is warm and dry. Rash noted.  Flat red rash visible on shoulders and across chest  Psychiatric: She has a normal mood and affect. Her behavior is normal. Judgment and thought content normal.    MAU Course  Procedures  Orders Placed This Encounter  Procedures  . Culture, OB Urine    Standing Status:   Standing    Number of Occurrences:   1  . Urinalysis, Routine w reflex microscopic    Standing Status:   Standing    Number of Occurrences:   1  . TSH    Standing Status:   Standing    Number of Occurrences:   1  . T4, free    Standing Status:   Standing    Number of Occurrences:   1  . Comprehensive metabolic panel    Standing Status:   Standing    Number of Occurrences:   1  . CBC with Differential/Platelet    Standing Status:   Standing    Number of Occurrences:   1  .  Glucose, capillary    Standing Status:   Standing    Number of Occurrences:   1  . Insert peripheral IV    Standing Status:   Standing    Number of Occurrences:   1   Results for orders placed or performed during the hospital encounter of 06/01/18 (from the past 24 hour(s))  Urinalysis, Routine w  reflex microscopic     Status: Abnormal   Collection Time: 06/01/18  4:46 PM  Result Value Ref Range   Color, Urine STRAW (A) YELLOW   APPearance CLEAR CLEAR   Specific Gravity, Urine 1.004 (L) 1.005 - 1.030   pH 7.0 5.0 - 8.0   Glucose, UA NEGATIVE NEGATIVE mg/dL   Hgb urine dipstick LARGE (A) NEGATIVE   Bilirubin Urine NEGATIVE NEGATIVE   Ketones, ur NEGATIVE NEGATIVE mg/dL   Protein, ur NEGATIVE NEGATIVE mg/dL   Nitrite NEGATIVE NEGATIVE   Leukocytes, UA LARGE (A) NEGATIVE   RBC / HPF 0-5 0 - 5 RBC/hpf   WBC, UA 21-50 0 - 5 WBC/hpf   Bacteria, UA RARE (A) NONE SEEN   Squamous Epithelial / LPF 0-5 0 - 5  TSH     Status: None   Collection Time: 06/01/18  5:12 PM  Result Value Ref Range   TSH 0.868 0.350 - 4.500 uIU/mL  Comprehensive metabolic panel     Status: Abnormal   Collection Time: 06/01/18  5:12 PM  Result Value Ref Range   Sodium 140 135 - 145 mmol/L   Potassium 3.5 3.5 - 5.1 mmol/L   Chloride 110 98 - 111 mmol/L   CO2 20 (L) 22 - 32 mmol/L   Glucose, Bld 96 70 - 99 mg/dL   BUN 11 6 - 20 mg/dL   Creatinine, Ser 1.61 0.44 - 1.00 mg/dL   Calcium 8.4 (L) 8.9 - 10.3 mg/dL   Total Protein 6.0 (L) 6.5 - 8.1 g/dL   Albumin 2.8 (L) 3.5 - 5.0 g/dL   AST 32 15 - 41 U/L   ALT 61 (H) 0 - 44 U/L   Alkaline Phosphatase 118 38 - 126 U/L   Total Bilirubin 0.4 0.3 - 1.2 mg/dL   GFR calc non Af Amer >60 >60 mL/min   GFR calc Af Amer >60 >60 mL/min   Anion gap 10 5 - 15  CBC with Differential/Platelet     Status: Abnormal   Collection Time: 06/01/18  5:12 PM  Result Value Ref Range   WBC 12.2 (H) 4.0 - 10.5 K/uL   RBC 3.52 (L) 3.87 - 5.11 MIL/uL   Hemoglobin 10.1 (L) 12.0 - 15.0  g/dL   HCT 09.6 (L) 04.5 - 40.9 %   MCV 87.8 78.0 - 100.0 fL   MCH 28.7 26.0 - 34.0 pg   MCHC 32.7 30.0 - 36.0 g/dL   RDW 81.1 91.4 - 78.2 %   Platelets 284 150 - 400 K/uL   Neutrophils Relative % 78 %   Neutro Abs 9.5 (H) 1.7 - 7.7 K/uL   Lymphocytes Relative 17 %   Lymphs Abs 2.1 0.7 - 4.0 K/uL   Monocytes Relative 5 %   Monocytes Absolute 0.6 0.1 - 1.0 K/uL   Eosinophils Relative 0 %   Eosinophils Absolute 0.1 0.0 - 0.7 K/uL   Basophils Relative 0 %   Basophils Absolute 0.0 0.0 - 0.1 K/uL  Glucose, capillary     Status: None   Collection Time: 06/01/18  5:37 PM  Result Value Ref Range   Glucose-Capillary 83 70 - 99 mg/dL    Patient Vitals for the past 24 hrs:  BP Temp Temp src Pulse Resp SpO2 Weight  06/01/18 1919 140/76 - - (!) 52 - - -  06/01/18 1911 - - - - - 100 % -  06/01/18 1900 (!) 143/85 - - (!) 52 - 100 % -  06/01/18 1846 Marland Kitchen)  143/97 - - (!) 54 - - -  06/01/18 1830 (!) 143/83 - - (!) 51 - - -  06/01/18 1816 (!) 155/72 - - (!) 50 - - -  06/01/18 1800 (!) 149/84 - - (!) 51 - - -  06/01/18 1750 140/80 - - (!) 46 - - -  06/01/18 1730 (!) 147/87 - - (!) 48 - - -  06/01/18 1700 (!) 148/101 - - (!) 53 - - -  06/01/18 1619 (!) 134/98 98.3 F (36.8 C) Oral (!) 51 20 100 % 206 lb (93.4 kg)     Assessment and Plan  --HPI, lab results, and course of care in MAU coordinated with Dr. Ernestina Penna --Admit for observation and additional assessment  Calvert Cantor, CNM 06/01/2018, 7:32 PM

## 2018-06-02 DIAGNOSIS — O139 Gestational [pregnancy-induced] hypertension without significant proteinuria, unspecified trimester: Secondary | ICD-10-CM | POA: Diagnosis not present

## 2018-06-02 LAB — CBC WITH DIFFERENTIAL/PLATELET
Basophils Absolute: 0 10*3/uL (ref 0.0–0.1)
Basophils Relative: 0 %
EOS ABS: 0.2 10*3/uL (ref 0.0–0.7)
Eosinophils Relative: 2 %
HEMATOCRIT: 26.4 % — AB (ref 36.0–46.0)
HEMOGLOBIN: 8.7 g/dL — AB (ref 12.0–15.0)
LYMPHS ABS: 2.1 10*3/uL (ref 0.7–4.0)
LYMPHS PCT: 21 %
MCH: 29.4 pg (ref 26.0–34.0)
MCHC: 33 g/dL (ref 30.0–36.0)
MCV: 89.2 fL (ref 78.0–100.0)
MONOS PCT: 6 %
Monocytes Absolute: 0.6 10*3/uL (ref 0.1–1.0)
NEUTROS ABS: 7.2 10*3/uL (ref 1.7–7.7)
NEUTROS PCT: 71 %
Platelets: 209 10*3/uL (ref 150–400)
RBC: 2.96 MIL/uL — AB (ref 3.87–5.11)
RDW: 14.1 % (ref 11.5–15.5)
WBC: 10.1 10*3/uL (ref 4.0–10.5)

## 2018-06-02 LAB — COMPREHENSIVE METABOLIC PANEL
ALT: 45 U/L — AB (ref 0–44)
ANION GAP: 7 (ref 5–15)
AST: 21 U/L (ref 15–41)
Albumin: 2.2 g/dL — ABNORMAL LOW (ref 3.5–5.0)
Alkaline Phosphatase: 91 U/L (ref 38–126)
BUN: 15 mg/dL (ref 6–20)
CHLORIDE: 109 mmol/L (ref 98–111)
CO2: 22 mmol/L (ref 22–32)
CREATININE: 0.76 mg/dL (ref 0.44–1.00)
Calcium: 7.8 mg/dL — ABNORMAL LOW (ref 8.9–10.3)
Glucose, Bld: 105 mg/dL — ABNORMAL HIGH (ref 70–99)
POTASSIUM: 3.8 mmol/L (ref 3.5–5.1)
SODIUM: 138 mmol/L (ref 135–145)
Total Bilirubin: 0.3 mg/dL (ref 0.3–1.2)
Total Protein: 5.2 g/dL — ABNORMAL LOW (ref 6.5–8.1)

## 2018-06-02 LAB — CULTURE, OB URINE: Culture: NO GROWTH

## 2018-06-02 MED ORDER — BUSPIRONE HCL 10 MG PO TABS: 10.0000 mg | ORAL_TABLET | Freq: Two times a day (BID) | ORAL | 3 refills | Status: DC

## 2018-06-02 MED ORDER — PAROXETINE HCL 10 MG PO TABS: 10.0000 mg | ORAL_TABLET | Freq: Every day | ORAL | 3 refills | Status: DC

## 2018-06-02 MED ORDER — SODIUM CHLORIDE 0.9 % IV SOLN
510.0000 mg | Freq: Once | INTRAVENOUS | Status: AC
  Administered 2018-06-02: 510 mg via INTRAVENOUS
  Filled 2018-06-02: qty 17

## 2018-06-02 MED ORDER — BUSPIRONE HCL 10 MG PO TABS
10.0000 mg | ORAL_TABLET | Freq: Two times a day (BID) | ORAL | Status: DC
  Administered 2018-06-02: 10 mg via ORAL
  Filled 2018-06-02 (×3): qty 1

## 2018-06-02 MED ORDER — PANTOPRAZOLE SODIUM 40 MG PO TBEC: 40.0000 mg | DELAYED_RELEASE_TABLET | Freq: Every day | ORAL | 3 refills | Status: DC

## 2018-06-02 MED ORDER — PAROXETINE HCL 10 MG PO TABS
10.0000 mg | ORAL_TABLET | Freq: Every day | ORAL | Status: DC
  Administered 2018-06-02: 10 mg via ORAL
  Filled 2018-06-02 (×2): qty 1

## 2018-06-02 NOTE — Progress Notes (Signed)
Pt discharged to home with husband and sister.  Condition stable.  Reviewed medications with with patient and husband together.  All other post-partum instructions for home remain same.  Pt ambulated to car with Luisa DagoJ. Bass, NT.  No equipment for home ordered at discharge.

## 2018-06-02 NOTE — Progress Notes (Signed)
Hospital day #2: Postpartum readmit for hypertension, nausea, chest pain  Subjective: Patient notes slept well overnight after Ambien. Patient reports upon waking this morning her headache returned and rates it 8 out of 10. Patient reports seeing flashes of lights. Patient notes chest pain and shortness of breath resolved after the GI cocktail. Patient notes nausea and dry heaving upon waking. Patient reports still with diarrhea. Patient reports no fevers. Patient reports no right upper quadrant pain. Patient reports fundal tenderness. Patient notes appetite is still minimal. Patient reports rash this morning that resolved before my visit without any treatment. Patient reports fatigue.  Objective: Vitals:   06/02/18 0350 06/02/18 0738 06/02/18 0822 06/02/18 1120  BP: 109/76 (!) 151/107 (!) 144/99 129/85  Pulse: (!) 55 (!) 53 61 67  Resp: 18 16 18 18   Temp: 97.8 F (36.6 C) 98.1 F (36.7 C)  97.7 F (36.5 C)  TempSrc: Oral Oral  Oral  SpO2: 100% 100% 100% 100%  Weight:      Height:       Gen.: Anxious appearing woman in no distress Cardiovascular: Bradycardia, no murmur rub or gallop Pulmonary: Clear to auscultation bilaterally Abdomen: Fundus firm below umbilicus, properly tender. No right upper quadrant pain GU: Propria lochia Lower extremity: 2+ edema, 1+ DTR, no clonus  CBC Latest Ref Rng & Units 06/02/2018 06/01/2018 05/30/2018  WBC 4.0 - 10.5 K/uL 10.1 12.2(H) 9.1  Hemoglobin 12.0 - 15.0 g/dL 4.0(J8.7(L) 10.1(L) 8.8(L)  Hematocrit 36.0 - 46.0 % 26.4(L) 30.9(L) 26.6(L)  Platelets 150 - 400 K/uL 209 284 210    CMP Latest Ref Rng & Units 06/02/2018 06/01/2018 05/30/2018  Glucose 70 - 99 mg/dL 811(B105(H) 96 147(W101(H)  BUN 6 - 20 mg/dL 15 11 10   Creatinine 0.44 - 1.00 mg/dL 2.950.76 6.210.72 3.080.62  Sodium 135 - 145 mmol/L 138 140 139  Potassium 3.5 - 5.1 mmol/L 3.8 3.5 3.7  Chloride 98 - 111 mmol/L 109 110 114(H)  CO2 22 - 32 mmol/L 22 20(L) 18(L)  Calcium 8.9 - 10.3 mg/dL 7.8(L) 8.4(L) 8.2(L)  Total  Protein 6.5 - 8.1 g/dL 5.2(L) 6.0(L) 5.4(L)  Total Bilirubin 0.3 - 1.2 mg/dL 0.3 0.4 0.4  Alkaline Phos 38 - 126 U/L 91 118 95  AST 15 - 41 U/L 21 32 24  ALT 0 - 44 U/L 45(H) 61(H) 29   Assessment plan: 27 year old G1 P1 6 days postpartum with multiple complaints likely stemming from anxiety.  - Anxiety. Long discussion with the patient regarding her postpartum anxiety. Patient denies depressive symptoms currently but does note anxiety. Patient does note difficult to sleep and worrying about herself and her baby. Patient with history of depression though she has not been treated in 2 years and does follow with the therapist. I discussed with patient headache, nausea, diarrhea and high blood pressures can all be caused by worry and anxiety. Patient then admitted to having anxiety andnoted that it made sense to her that these symptoms did not happen when she was resting and sleeping but only occurred upon waking and thinking about things. This discussion was improved by asking her support team to step outside so we can discuss in private. Patient agreed to trial of Paxil and Busbar. I asked patient to follow-up with her therapist.  -hypertension. Only mild elevations in blood pressures have been noted and these have only been noted when patient has been more anxious. When resting, sleeping and after Ambien her blood pressures are normal. Patient does have headache and elevation of  ALT which potentially could be preeclampsia however I believe the blood pressures are related to anxiety and the ALT elevation is related to her nausea and emesis.  - anemia. This is likely impacting her fatigue. We'll give IV iron now.   - Nausea, diarrhea, elevated ALT. Unclear etiology. Possible GI viral illness, possibly anxiety with ALT elevated due to the emesis, possibly resolving preeclampsia. Encouraging that ALT is trending down.  - Epigastric pain. Resolved with GI cocktail will discharge patient home on  Protonix  - Disposition. Patient is slightly improved from admission and more reassured over her total health condition. Patient has outpatient follow-up next week and is safe for discharge. Patient understands reasons to return.  Lendon Colonel 06/02/2018 12:31 PM

## 2018-06-06 DIAGNOSIS — F411 Generalized anxiety disorder: Secondary | ICD-10-CM | POA: Diagnosis not present

## 2018-06-15 DIAGNOSIS — D649 Anemia, unspecified: Secondary | ICD-10-CM | POA: Diagnosis not present

## 2018-06-15 DIAGNOSIS — F53 Postpartum depression: Secondary | ICD-10-CM | POA: Diagnosis not present

## 2018-06-24 NOTE — Discharge Summary (Signed)
Cheryl Galvan MRN: [id number] DOB/AGE: 1991/05/03 27 y.o.  Admit date: 06/01/2018 Discharge date: 06/02/18  Admission Diagnoses: PP FATIGUE, WEAKNESS, SHORT OF BREATH, sinus bradycardia, gestational hypertension postpartum, anxiety  Discharge Diagnoses: PP FATIGUE, WEAKNESS, SHORT OF BREATH        Active Problems:   Postpartum hypertension sinus bradycardia, gestational hypertension postpartum, anxiety   Discharged Condition: stable  Hospital Course: patient was admitted with a multitude of complaints that were most likely stemming from anxiety. Patient did have a history of gestational hypertension at the end of her pregnancy and did have some elevated blood pressures on admission. Her exam was not consistent with preeclampsia so magnesium and antihypertensives were deferred. Her labs were unremarkable. Her EKG showed sinus bradycardia and she had been evaluated by her PCP within the last few days. Patient had her symptoms treated with GI cocktail and Protonix she had close monitoring of her symptoms blood pressures, vital signs and labs.  On hospital take 2 vitals were reviewed and stable. General appeared anxious. Cardiovascular: Bradycardia, regular rate and rhythm. Pulmonary: Clear to auscultation bilaterally. Abdomen: No right upper quadrant pain, appropriate uterine tenderness, good bowel sounds. No tympany. Lower extremity: Nontender, no edema  On hospital day #2 all family was asked to leave the room and a candid discussion was had with patient about her symptoms and her anxiety. She admitted to increased anxiety. She agreed to a trial of Paxil. We discussed her stability of exam and vitals and labs and made a decision together about discharge on Paxil, BuSpar and to treat herheartburn and nausea. She'll be followed closely as an outpatient with a follow-up visit within 1 week  Consults: None  Treatments: anti-anxiety meds, close observation, reassurance  Disposition: Discharge  disposition: 01-Home or Self Care        Allergies as of 06/02/2018   No Known Allergies     Medication List    TAKE these medications   busPIRone 10 MG tablet Commonly known as:  BUSPAR Take 1 tablet (10 mg total) by mouth 2 (two) times daily.   ferrous sulfate 325 (65 FE) MG EC tablet Take 325 mg by mouth 3 (three) times daily with meals.   multivitamin-prenatal 27-0.8 MG Tabs tablet Take 1 tablet by mouth daily at 12 noon.   ondansetron 4 MG tablet Commonly known as:  ZOFRAN Take 4 mg by mouth every 8 (eight) hours as needed for nausea or vomiting.   pantoprazole 40 MG tablet Commonly known as:  PROTONIX Take 1 tablet (40 mg total) by mouth daily.   PARoxetine 10 MG tablet Commonly known as:  PAXIL Take 1 tablet (10 mg total) by mouth daily.        Signed: Lendon ColonelKelly A Sheehan Stacey, MD 06/24/2018, 3:05 PM

## 2018-06-27 DIAGNOSIS — F411 Generalized anxiety disorder: Secondary | ICD-10-CM | POA: Diagnosis not present

## 2018-07-10 DIAGNOSIS — F411 Generalized anxiety disorder: Secondary | ICD-10-CM | POA: Diagnosis not present

## 2018-07-14 DIAGNOSIS — Z124 Encounter for screening for malignant neoplasm of cervix: Secondary | ICD-10-CM | POA: Diagnosis not present

## 2018-07-24 DIAGNOSIS — F411 Generalized anxiety disorder: Secondary | ICD-10-CM | POA: Diagnosis not present

## 2018-08-21 DIAGNOSIS — F411 Generalized anxiety disorder: Secondary | ICD-10-CM | POA: Diagnosis not present

## 2018-08-29 DIAGNOSIS — F411 Generalized anxiety disorder: Secondary | ICD-10-CM | POA: Diagnosis not present

## 2018-09-12 DIAGNOSIS — F411 Generalized anxiety disorder: Secondary | ICD-10-CM | POA: Diagnosis not present

## 2018-09-26 DIAGNOSIS — F411 Generalized anxiety disorder: Secondary | ICD-10-CM | POA: Diagnosis not present

## 2018-10-04 DIAGNOSIS — F411 Generalized anxiety disorder: Secondary | ICD-10-CM | POA: Diagnosis not present

## 2018-10-08 ENCOUNTER — Emergency Department
Admission: EM | Admit: 2018-10-08 | Discharge: 2018-10-08 | Disposition: A | Discharge status: Home / Self Care | Payer: BLUE CROSS/BLUE SHIELD | Attending: Emergency Medicine | Admitting: Emergency Medicine

## 2018-10-08 ENCOUNTER — Other Ambulatory Visit: Payer: Self-pay

## 2018-10-08 DIAGNOSIS — R109 Unspecified abdominal pain: Secondary | ICD-10-CM

## 2018-10-08 DIAGNOSIS — J45909 Unspecified asthma, uncomplicated: Secondary | ICD-10-CM | POA: Diagnosis not present

## 2018-10-08 DIAGNOSIS — R300 Dysuria: Secondary | ICD-10-CM | POA: Diagnosis not present

## 2018-10-08 DIAGNOSIS — N200 Calculus of kidney: Secondary | ICD-10-CM

## 2018-10-08 DIAGNOSIS — R1031 Right lower quadrant pain: Secondary | ICD-10-CM | POA: Diagnosis not present

## 2018-10-08 DIAGNOSIS — Z79899 Other long term (current) drug therapy: Secondary | ICD-10-CM | POA: Insufficient documentation

## 2018-10-08 HISTORY — DX: Calculus of kidney: N20.0

## 2018-10-08 LAB — COMPREHENSIVE METABOLIC PANEL
ALT: 16 U/L (ref 0–44)
ANION GAP: 16 — AB (ref 5–15)
AST: 22 U/L (ref 15–41)
Albumin: 4.4 g/dL (ref 3.5–5.0)
Alkaline Phosphatase: 116 U/L (ref 38–126)
BUN: 17 mg/dL (ref 6–20)
CHLORIDE: 103 mmol/L (ref 98–111)
CO2: 19 mmol/L — AB (ref 22–32)
Calcium: 9.6 mg/dL (ref 8.9–10.3)
Creatinine, Ser: 0.83 mg/dL (ref 0.44–1.00)
GFR calc non Af Amer: >60 mL/min (ref >60)
Glucose, Bld: 140 mg/dL — ABNORMAL HIGH (ref 70–99)
Potassium: 4.2 mmol/L (ref 3.5–5.1)
SODIUM: 138 mmol/L (ref 135–145)
Total Bilirubin: 0.8 mg/dL (ref 0.3–1.2)
Total Protein: 8 g/dL (ref 6.5–8.1)

## 2018-10-08 LAB — PREGNANCY, URINE: Preg Test, Ur: NEGATIVE

## 2018-10-08 LAB — CBC
HEMATOCRIT: 42.6 % (ref 36.0–46.0)
Hemoglobin: 14 g/dL (ref 12.0–15.0)
MCH: 28.5 pg (ref 26.0–34.0)
MCHC: 32.9 g/dL (ref 30.0–36.0)
MCV: 86.8 fL (ref 80.0–100.0)
NRBC: 0 % (ref 0.0–0.2)
PLATELETS: 338 10*3/uL (ref 150–400)
RBC: 4.91 MIL/uL (ref 3.87–5.11)
RDW: 12.5 % (ref 11.5–15.5)
WBC: 11.4 10*3/uL — ABNORMAL HIGH (ref 4.0–10.5)

## 2018-10-08 LAB — URINALYSIS, COMPLETE (UACMP) WITH MICROSCOPIC
Bacteria, UA: NONE SEEN
Bilirubin Urine: NEGATIVE
GLUCOSE, UA: NEGATIVE mg/dL
KETONES UR: 80 mg/dL — AB
Leukocytes, UA: NEGATIVE
Nitrite: NEGATIVE
PH: 7 (ref 5.0–8.0)
Protein, ur: NEGATIVE mg/dL
Specific Gravity, Urine: 1.017 (ref 1.005–1.030)

## 2018-10-08 LAB — LIPASE, BLOOD: LIPASE: 39 U/L (ref 11–51)

## 2018-10-08 MED ORDER — SODIUM CHLORIDE 0.9 % IV BOLUS
1000.0000 mL | Freq: Once | INTRAVENOUS | Status: AC
  Administered 2018-10-08: 1000 mL via INTRAVENOUS

## 2018-10-08 MED ORDER — OXYCODONE-ACETAMINOPHEN 5-325 MG PO TABS
1.0000 | ORAL_TABLET | ORAL | Status: DC | PRN
  Administered 2018-10-08: 1 via ORAL
  Filled 2018-10-08: qty 1

## 2018-10-08 MED ORDER — ONDANSETRON 4 MG PO TBDP
4.0000 mg | ORAL_TABLET | Freq: Once | ORAL | Status: AC | PRN
  Administered 2018-10-08: 4 mg via ORAL

## 2018-10-08 MED ORDER — ONDANSETRON 4 MG PO TBDP
ORAL_TABLET | ORAL | Status: AC
  Filled 2018-10-08: qty 1

## 2018-10-08 MED ORDER — ONDANSETRON HCL 4 MG PO TABS: 4.0000 mg | ORAL_TABLET | Freq: Three times a day (TID) | ORAL | 0 refills | Status: DC | PRN

## 2018-10-08 MED ORDER — OXYCODONE-ACETAMINOPHEN 5-325 MG PO TABS: 1.0000 | ORAL_TABLET | ORAL | 0 refills | Status: AC | PRN

## 2018-10-08 NOTE — ED Provider Notes (Signed)
[facility] Emergency Department Provider Note   ____________________________________________   I have reviewed the triage vital signs and the nursing notes.   HISTORY  Chief Complaint Back Pain   History limited by: Not Limited   HPI Cheryl Galvan is a 27 y.o. female who presents to the emergency department today because of concerns for right flank pain.  Patient states that the pain started this morning.  It was severe.  It did radiate somewhat into her right lower abdomen.  Was accompanied by nausea and vomiting.  Patient states that she had a feeling of needing to urinate.  The patient states that she has a history of kidney stones and this reminds her of her previous kidney stone.  She denies any fevers.   Per medical record review patient has a history of kidney stone  Past Medical History:  Diagnosis Date  . Acne   . Allergy    sesonal allergy   . Amblyopia   . Anxiety 2009  . Asthma     no recent difficulties   . Depression    meds years ago  . Kidney stone   . Migraine    migraines - on it years ago    Patient Active Problem List   Diagnosis Date Noted  . Postpartum hypertension 06/01/2018  . Postpartum care following vaginal delivery (6/22) 05/29/2018  . Maternal anemia, with delivery 05/28/2018  . SVD 05/27/2018 05/27/2018  . Second-degree perineal laceration, with delivery 05/27/2018  . GDM, class A1 05/26/2018  . Gestational hypertension 05/26/2018  . Encounter for planned induction of labor 05/25/2018  . Abnormal glucose tolerance test (GTT) during pregnancy, antepartum 03/24/2018  . Seasonal and perennial allergic rhinitis 08/31/2016  . GERD (gastroesophageal reflux disease) 10/18/2013  . IBS (irritable bowel syndrome) 10/18/2013  . Migraine with aura 10/03/2013  . 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 Moderate persistent asthma 08/15/2007    Past Surgical History:  Procedure Laterality Date  . WISDOM TOOTH  EXTRACTION      Prior to Admission medications   Medication Sig Start Date End Date Taking? Authorizing Provider  busPIRone (BUSPAR) 10 MG tablet Take 1 tablet (10 mg total) by mouth 2 (two) times daily. 06/02/18   Noland Fordyce, MD  ferrous sulfate 325 (65 FE) MG EC tablet Take 325 mg by mouth 3 (three) times daily with meals.    [provider]  ondansetron (ZOFRAN) 4 MG tablet Take 4 mg by mouth every 8 (eight) hours as needed for nausea or vomiting.    [provider]  pantoprazole (PROTONIX) 40 MG tablet Take 1 tablet (40 mg total) by mouth daily. 06/03/18   Noland Fordyce, MD  PARoxetine (PAXIL) 10 MG tablet Take 1 tablet (10 mg total) by mouth daily. 06/03/18   Noland Fordyce, MD  Prenatal Vit-Fe Fumarate-FA (MULTIVITAMIN-PRENATAL) 27-0.8 MG TABS tablet Take 1 tablet by mouth daily at 12 noon.    [provider]    Allergies Patient has no known allergies.  Family History  Problem Relation Age of Onset  . Hypertension Mother   . Allergic rhinitis Mother   . Anxiety disorder Mother   . Depression Mother   . Alcohol abuse Father   . Drug abuse Father   . Allergic rhinitis Sister   . Cancer Maternal Grandmother        colon  . Stroke Maternal Grandmother   . Diabetes Maternal Grandmother   . Hypertension Maternal Grandmother   . Cancer Maternal  Grandfather        lung cancer  . Stroke Maternal Grandfather   . Cancer Paternal Grandfather        prostate  . Heart disease Unknown   . Angioedema Neg Hx   . Eczema Neg Hx   . Immunodeficiency Neg Hx   . Urticaria Neg Hx     Social History Social History   Tobacco Use  . Smoking status: Never Smoker  . Smokeless tobacco: Never Used  Substance Use Topics  . Alcohol use: Not Currently    Comment: occasionally; 1 drink weekly  . Drug use: No    Review of Systems Constitutional: No fever/chills Eyes: No visual changes. ENT: No sore throat. Cardiovascular: Denies chest pain. Respiratory:  Denies shortness of breath. Gastrointestinal: Positive for right flank pain Genitourinary: Negative for dysuria. Musculoskeletal: Negative for back pain. Skin: Negative for rash. Neurological: Negative for headaches, focal weakness or numbness.  ____________________________________________   PHYSICAL EXAM:  VITAL SIGNS: ED Triage Vitals  Enc Vitals Group     BP 10/08/18 1616 (!) 177/160     Pulse Rate 10/08/18 1616 96     Resp 10/08/18 1616 18     Temp 10/08/18 1616 98.2 F (36.8 C)     Temp Source 10/08/18 1616 Oral     SpO2 10/08/18 1616 97 %     Weight 10/08/18 1618 205 lb (93 kg)     Height 10/08/18 1618 5\' 5"  (1.651 m)     Head Circumference --      Peak Flow --      Pain Score 10/08/18 1624 10   Constitutional: Alert and oriented.  Eyes: Conjunctivae are normal.  ENT      Head: Normocephalic and atraumatic.      Nose: No congestion/rhinnorhea.      Mouth/Throat: Mucous membranes are moist.      Neck: No stridor. Hematological/Lymphatic/Immunilogical: No cervical lymphadenopathy. Cardiovascular: Normal rate, regular rhythm.  No murmurs, rubs, or gallops. Respiratory: Normal respiratory effort without tachypnea nor retractions. Breath sounds are clear and equal bilaterally. No wheezes/rales/rhonchi. Gastrointestinal: Soft and non tender. No rebound. No guarding.  Genitourinary: Deferred Musculoskeletal: Normal range of motion in all extremities. No lower extremity edema. Neurologic:  Normal speech and language. No gross focal neurologic deficits are appreciated.  Skin:  Skin is warm, dry and intact. No rash noted. Psychiatric: Mood and affect are normal. Speech and behavior are normal. Patient exhibits appropriate insight and judgment.  ____________________________________________    LABS (pertinent positives/negatives)  CBC wbc 11.4, hgb 14.0, plt 338 Lipase 39 CMP na 138 UA clear, large dipstick, 80 ketones, >50 rbc 0-5  wbc ____________________________________________   EKG  None  ____________________________________________    RADIOLOGY  None  ____________________________________________   PROCEDURES  Procedures  ____________________________________________   INITIAL IMPRESSION / ASSESSMENT AND PLAN / ED COURSE  Pertinent labs & imaging results that were available during my care of the patient were reviewed by me and considered in my medical decision making (see chart for details).   Patient presented for right flank pain with hx of kidney stones. UA with >50 rbc. No signs of infection. Mild leukocytosis in serum likely secondary to pain. Patient felt comfortable without imaging at this point. Will treat empirically for kidney stone. Doubt appendicitis or other significant intraabdominal infection.    ____________________________________________   FINAL CLINICAL IMPRESSION(S) / ED DIAGNOSES  Final diagnoses:  Right flank pain  Kidney stone     Note: This dictation was prepared  with Office manager. Any transcriptional errors that result from this process are unintentional     Phineas Semen, MD 10/08/18 2053

## 2018-10-08 NOTE — ED Notes (Signed)
Pt is breast  Feeding  -  She  Has  No  Pain  At this time

## 2018-10-08 NOTE — ED Notes (Signed)
Pt has  Diarrhea and  And noticed  Some  Bright red blood in toilet   notsure if it was  From stool or urine

## 2018-10-08 NOTE — Discharge Instructions (Addendum)
Please seek medical attention for any high fevers, chest pain, shortness of breath, change in behavior, persistent vomiting, bloody stool or any other new or concerning symptoms.  

## 2018-10-08 NOTE — ED Notes (Signed)
Patient requesting ice chips at this time.  Told patient to wait until she saw a physician before having anything else by mouth.

## 2018-10-08 NOTE — ED Triage Notes (Addendum)
Pt states lower R back pain. Went to [facility] this AM and told no UTI. Hx Kidney stones. Appears uncomfortable. States dysuria and hematuria. Still has gallbladder and appendix. States vomiting and diarrhea today since seeing [facility].

## 2018-10-08 NOTE — ED Notes (Signed)
When pt was told that she had to wait in the lobby she was angry about wait time and stated "I'm not going to wait that long." informed that pt should wait to see MD and that this RN had given her medication for pain and nausea. Husband wheeled pt out to lobby at this time.

## 2018-10-08 NOTE — ED Notes (Signed)
Report given to Ann.

## 2018-10-18 DIAGNOSIS — F411 Generalized anxiety disorder: Secondary | ICD-10-CM | POA: Diagnosis not present

## 2018-10-30 DIAGNOSIS — F411 Generalized anxiety disorder: Secondary | ICD-10-CM | POA: Diagnosis not present

## 2018-11-09 DIAGNOSIS — F411 Generalized anxiety disorder: Secondary | ICD-10-CM | POA: Diagnosis not present

## 2019-09-14 ENCOUNTER — Ambulatory Visit: Payer: BLUE CROSS/BLUE SHIELD | Admitting: Family Medicine

## 2019-12-07 NOTE — L&D Delivery Note (Signed)
Delivery Note At 4:24 AM Cheryl viable female was delivered via Vaginal, Spontaneous (Presentation: Left Occiput Anterior) restituted to ROA. Loose nuchal cord x1 reduced and body cord noted. APGAR: 5, 7; weight pending .   Placenta status: Spontaneous, Intact.  Cord: 3 vessels with the following complications: None.  Cord pH: n/Cheryl  Anesthesia: Epidural Episiotomy: None Lacerations: 1st degree Suture Repair: vicryl rapide Est. Blood Loss (mL):    Mom to postpartum.  Baby to Couplet care / Skin to Skin.  Cheryl Galvan Cheryl Galvan 07/28/2020, 4:51 AM

## 2020-06-28 ENCOUNTER — Encounter: Payer: Self-pay | Admitting: Obstetrics and Gynecology

## 2020-06-28 ENCOUNTER — Observation Stay
Admission: EM | Admit: 2020-06-28 | Discharge: 2020-06-29 | Disposition: A | Discharge status: Home / Self Care | Payer: 59 | Attending: Urology | Admitting: Urology

## 2020-06-28 ENCOUNTER — Observation Stay: Payer: 59

## 2020-06-28 DIAGNOSIS — Z8249 Family history of ischemic heart disease and other diseases of the circulatory system: Secondary | ICD-10-CM | POA: Insufficient documentation

## 2020-06-28 DIAGNOSIS — O99613 Diseases of the digestive system complicating pregnancy, third trimester: Secondary | ICD-10-CM | POA: Diagnosis not present

## 2020-06-28 DIAGNOSIS — N132 Hydronephrosis with renal and ureteral calculous obstruction: Secondary | ICD-10-CM | POA: Diagnosis not present

## 2020-06-28 DIAGNOSIS — Z801 Family history of malignant neoplasm of trachea, bronchus and lung: Secondary | ICD-10-CM | POA: Insufficient documentation

## 2020-06-28 DIAGNOSIS — O26899 Other specified pregnancy related conditions, unspecified trimester: Secondary | ICD-10-CM | POA: Diagnosis present

## 2020-06-28 DIAGNOSIS — Z833 Family history of diabetes mellitus: Secondary | ICD-10-CM | POA: Insufficient documentation

## 2020-06-28 DIAGNOSIS — O24419 Gestational diabetes mellitus in pregnancy, unspecified control: Secondary | ICD-10-CM | POA: Diagnosis not present

## 2020-06-28 DIAGNOSIS — O26833 Pregnancy related renal disease, third trimester: Principal | ICD-10-CM | POA: Insufficient documentation

## 2020-06-28 DIAGNOSIS — Z3A33 33 weeks gestation of pregnancy: Secondary | ICD-10-CM | POA: Insufficient documentation

## 2020-06-28 DIAGNOSIS — K219 Gastro-esophageal reflux disease without esophagitis: Secondary | ICD-10-CM | POA: Insufficient documentation

## 2020-06-28 DIAGNOSIS — R3129 Other microscopic hematuria: Secondary | ICD-10-CM | POA: Insufficient documentation

## 2020-06-28 DIAGNOSIS — Z79899 Other long term (current) drug therapy: Secondary | ICD-10-CM | POA: Insufficient documentation

## 2020-06-28 DIAGNOSIS — Z87442 Personal history of urinary calculi: Secondary | ICD-10-CM | POA: Insufficient documentation

## 2020-06-28 DIAGNOSIS — R109 Unspecified abdominal pain: Secondary | ICD-10-CM | POA: Diagnosis present

## 2020-06-28 DIAGNOSIS — Z811 Family history of alcohol abuse and dependence: Secondary | ICD-10-CM | POA: Diagnosis not present

## 2020-06-28 DIAGNOSIS — Z823 Family history of stroke: Secondary | ICD-10-CM | POA: Diagnosis not present

## 2020-06-28 DIAGNOSIS — J4540 Moderate persistent asthma, uncomplicated: Secondary | ICD-10-CM | POA: Diagnosis not present

## 2020-06-28 DIAGNOSIS — Z20822 Contact with and (suspected) exposure to covid-19: Secondary | ICD-10-CM | POA: Insufficient documentation

## 2020-06-28 DIAGNOSIS — K589 Irritable bowel syndrome without diarrhea: Secondary | ICD-10-CM | POA: Diagnosis not present

## 2020-06-28 DIAGNOSIS — Z8489 Family history of other specified conditions: Secondary | ICD-10-CM | POA: Diagnosis not present

## 2020-06-28 DIAGNOSIS — Z800 Family history of malignant neoplasm of digestive organs: Secondary | ICD-10-CM | POA: Diagnosis not present

## 2020-06-28 DIAGNOSIS — Z818 Family history of other mental and behavioral disorders: Secondary | ICD-10-CM | POA: Insufficient documentation

## 2020-06-28 DIAGNOSIS — Z8042 Family history of malignant neoplasm of prostate: Secondary | ICD-10-CM | POA: Diagnosis not present

## 2020-06-28 DIAGNOSIS — O26893 Other specified pregnancy related conditions, third trimester: Secondary | ICD-10-CM | POA: Insufficient documentation

## 2020-06-28 LAB — RESP PANEL BY RT PCR (RSV, FLU A&B, COVID)
Influenza A by PCR: NEGATIVE
Influenza B by PCR: NEGATIVE
Respiratory Syncytial Virus by PCR: NEGATIVE
SARS Coronavirus 2 by RT PCR: NEGATIVE

## 2020-06-28 LAB — URINALYSIS, ROUTINE W REFLEX MICROSCOPIC
Bilirubin Urine: NEGATIVE
Glucose, UA: NEGATIVE mg/dL
Hgb urine dipstick: NEGATIVE
Ketones, ur: 20 mg/dL — AB
Nitrite: NEGATIVE
Protein, ur: NEGATIVE mg/dL
Specific Gravity, Urine: 1.019 (ref 1.005–1.030)
pH: 7 (ref 5.0–8.0)

## 2020-06-28 LAB — GLUCOSE, CAPILLARY
Glucose-Capillary: 115 mg/dL — ABNORMAL HIGH (ref 70–99)
Glucose-Capillary: 106 mg/dL — ABNORMAL HIGH (ref 70–99)
Glucose-Capillary: 110 mg/dL — ABNORMAL HIGH (ref 70–99)

## 2020-06-28 MED ORDER — OXYTOCIN-SODIUM CHLORIDE 30-0.9 UT/500ML-% IV SOLN: 2.5000 [IU]/h | INTRAVENOUS | Status: DC

## 2020-06-28 MED ORDER — AMMONIA AROMATIC IN INHA
RESPIRATORY_TRACT | Status: AC
  Filled 2020-06-28: qty 10

## 2020-06-28 MED ORDER — ACETAMINOPHEN 325 MG PO TABS
650.0000 mg | ORAL_TABLET | ORAL | Status: DC | PRN
  Administered 2020-06-28: 650 mg via ORAL
  Filled 2020-06-28: qty 2

## 2020-06-28 MED ORDER — OXYTOCIN BOLUS FROM INFUSION: 333.0000 mL | Freq: Once | INTRAVENOUS | Status: DC

## 2020-06-28 MED ORDER — LACTATED RINGERS IV SOLN
500.0000 mL | INTRAVENOUS | Status: DC | PRN
  Administered 2020-06-28: 1000 mL via INTRAVENOUS

## 2020-06-28 MED ORDER — MORPHINE SULFATE (PF) 4 MG/ML IV SOLN
4.0000 mg | INTRAVENOUS | Status: DC | PRN
  Administered 2020-06-28 – 2020-06-29 (×11): 4 mg via INTRAVENOUS
  Filled 2020-06-28 (×11): qty 1

## 2020-06-28 MED ORDER — LACTATED RINGERS IV SOLN: INTRAVENOUS | Status: DC

## 2020-06-28 MED ORDER — MORPHINE SULFATE (PF) 2 MG/ML IV SOLN
2.0000 mg | INTRAVENOUS | Status: DC | PRN
  Administered 2020-06-28: 2 mg via INTRAVENOUS
  Filled 2020-06-28 (×2): qty 1

## 2020-06-28 MED ORDER — MORPHINE BOLUS VIA INFUSION: 2.0000 mg | Freq: Once | INTRAVENOUS | Status: DC

## 2020-06-28 MED ORDER — TAMSULOSIN HCL 0.4 MG PO CAPS
0.4000 mg | ORAL_CAPSULE | Freq: Every day | ORAL | Status: DC
  Administered 2020-06-28: 0.4 mg via ORAL
  Filled 2020-06-28 (×2): qty 1

## 2020-06-28 MED ORDER — PROMETHAZINE HCL 25 MG/ML IJ SOLN: 25.0000 mg | Freq: Once | INTRAMUSCULAR | Status: DC | PRN

## 2020-06-28 MED ORDER — MORPHINE SULFATE (PF) 2 MG/ML IV SOLN
2.0000 mg | Freq: Once | INTRAVENOUS | Status: AC
  Administered 2020-06-28: 2 mg via INTRAVENOUS

## 2020-06-28 MED ORDER — ONDANSETRON HCL 4 MG/2ML IJ SOLN
4.0000 mg | Freq: Four times a day (QID) | INTRAMUSCULAR | Status: DC | PRN
  Administered 2020-06-28 – 2020-06-29 (×2): 4 mg via INTRAVENOUS
  Filled 2020-06-28 (×2): qty 2

## 2020-06-28 MED ORDER — ONDANSETRON HCL 4 MG/2ML IJ SOLN
INTRAMUSCULAR | Status: AC
  Administered 2020-06-28: 4 mg via INTRAVENOUS
  Filled 2020-06-28: qty 2

## 2020-06-28 MED ORDER — MORPHINE BOLUS VIA INFUSION
2.0000 mg | INTRAVENOUS | Status: DC | PRN
  Filled 2020-06-28: qty 2

## 2020-06-28 MED ORDER — LIDOCAINE HCL (PF) 1 % IJ SOLN: 30.0000 mL | INTRAMUSCULAR | Status: DC | PRN

## 2020-06-28 NOTE — Progress Notes (Signed)
Difficulty monitoring fht's and contractions due to patient movment from pain. RN remains at bedside.

## 2020-06-28 NOTE — Consult Note (Signed)
I have been asked to see the patient by Dr. Brennan Bailey, for evaluation and management of right renal colic/hydronephrosis.  History of present illness: 29 year old [redacted] weeks pregnant female who presented to the hospital today with acute onset right-sided flank pain and associated nausea and vomiting.  The patient denied any fevers.  She denies any gross hematuria.  The pain began this morning, and has been unrelenting.  It has been managed with morphine injections IV.  The patient underwent a renal ultrasound demonstrating right hydroureteronephrosis with a nonobstructing 9 mm stone in the right lower pole.  The left side was normal.  The patient's urine analysis had microscopic hematuria but no evidence of infection.  The patient states she has passed numerous stones, the most recent one was during her first trimester of this pregnancy.  She has never seen a urologist.  She is never required any procedures to help passed stone.  The patient's pregnancy has been largely unremarkable other than the stone.  She has been recently diagnosed with gestational diabetes.  During the patient's first pregnancy she had placenta abruption.  Review of systems: A 12 point comprehensive review of systems was obtained and is negative unless otherwise stated in the history of present illness.  Patient Active Problem List   Diagnosis Date Noted  . Flank pain in pregnant patient 06/28/2020  . Postpartum hypertension 06/01/2018  . Postpartum care following vaginal delivery (6/22) 05/29/2018  . Maternal anemia, with delivery 05/28/2018  . SVD 05/27/2018 05/27/2018  . Second-degree perineal laceration, with delivery 05/27/2018  . GDM, class A1 05/26/2018  . Gestational hypertension 05/26/2018  . Encounter for planned induction of labor 05/25/2018  . Abnormal glucose tolerance test (GTT) during pregnancy, antepartum 03/24/2018  . Seasonal and perennial allergic rhinitis 08/31/2016  . GERD (gastroesophageal reflux  disease) 10/18/2013  . IBS (irritable bowel syndrome) 10/18/2013  . Migraine with aura 10/03/2013  . 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 Moderate persistent asthma 08/15/2007    No current facility-administered medications on file prior to encounter.   Current Outpatient Medications on File Prior to Encounter  Medication Sig Dispense Refill  . acetaminophen (TYLENOL) 500 MG tablet Take 1,000 mg by mouth every 6 (six) hours as needed for moderate pain.    . Prenatal Vit-Fe Fumarate-FA (MULTIVITAMIN-PRENATAL) 27-0.8 MG TABS tablet Take 1 tablet by mouth daily at 12 noon.    . busPIRone (BUSPAR) 10 MG tablet Take 1 tablet (10 mg total) by mouth 2 (two) times daily. (Patient not taking: Reported on 06/28/2020) 60 tablet 3  . ferrous sulfate 325 (65 FE) MG EC tablet Take 325 mg by mouth 3 (three) times daily with meals. (Patient not taking: Reported on 06/28/2020)    . ondansetron (ZOFRAN) 4 MG tablet Take 4 mg by mouth every 8 (eight) hours as needed for nausea or vomiting. (Patient not taking: Reported on 06/28/2020)    . ondansetron (ZOFRAN) 4 MG tablet Take 1 tablet (4 mg total) by mouth every 8 (eight) hours as needed for nausea or vomiting. (Patient not taking: Reported on 06/28/2020) 20 tablet 0  . pantoprazole (PROTONIX) 40 MG tablet Take 1 tablet (40 mg total) by mouth daily. (Patient not taking: Reported on 06/28/2020) 30 tablet 3  . PARoxetine (PAXIL) 10 MG tablet Take 1 tablet (10 mg total) by mouth daily. (Patient not taking: Reported on 06/28/2020) 30 tablet 3    Past Medical History:  Diagnosis Date  . Acne   . Allergy  sesonal allergy   . Amblyopia   . Anxiety 2009  . Asthma     no recent difficulties   . Depression    meds years ago  . Kidney stone   . Migraine    migraines - on it years ago    Past Surgical History:  Procedure Laterality Date  . WISDOM TOOTH EXTRACTION      Social History   Tobacco Use  . Smoking status: Never Smoker  . Smokeless  tobacco: Never Used  Substance Use Topics  . Alcohol use: Not Currently    Comment: occasionally; 1 drink weekly  . Drug use: No    Family History  Problem Relation Age of Onset  . Hypertension Mother   . Allergic rhinitis Mother   . Anxiety disorder Mother   . Depression Mother   . Alcohol abuse Father   . Drug abuse Father   . Allergic rhinitis Sister   . Cancer Maternal Grandmother        colon  . Stroke Maternal Grandmother   . Diabetes Maternal Grandmother   . Hypertension Maternal Grandmother   . Cancer Maternal Grandfather        lung cancer  . Stroke Maternal Grandfather   . Cancer Paternal Grandfather        prostate  . Heart disease Other   . Angioedema Neg Hx   . Eczema Neg Hx   . Immunodeficiency Neg Hx   . Urticaria Neg Hx     PE: Vitals:   06/28/20 1107 06/28/20 1122 06/28/20 1450  BP: (!) 122/61  (!) 101/62  Pulse: 89  91  Temp: 97.7 F (36.5 C)  98 F (36.7 C)  TempSrc: Oral  Oral  SpO2: 100% 100% 99%   Patient appears to be in moderate distress patient is alert and oriented x3 Atraumatic normocephalic head No cervical or supraclavicular lymphadenopathy appreciated No increased work of breathing, no audible wheezes/rhonchi Regular sinus rhythm/rate Abdomen is gravid, right CVA tenderness  lower extremities are symmetric without appreciable edema Grossly neurologically intact No identifiable skin lesions  No results for input(s): WBC, HGB, HCT in the last 72 hours. No results for input(s): NA, K, CL, CO2, GLUCOSE, BUN, CREATININE, CALCIUM in the last 72 hours. No results for input(s): LABPT, INR in the last 72 hours. No results for input(s): LABURIN in the last 72 hours. Results for orders placed or performed during the hospital encounter of 06/01/18  Culture, OB Urine     Status: None   Collection Time: 06/01/18  4:46 PM   Specimen: Urine, Random  Result Value Ref Range Status   Specimen Description   Final    URINE, RANDOM Performed  at [facility], [address] [place], [place] [address]    Special Requests   Final    NONE Performed at [facility], [address] [place], [place] [address]    Culture   Final    NO GROWTH NO GROUP B STREP (S.AGALACTIAE) ISOLATED Performed at [facility], [address], [place], [place] [address]    Report Status 06/02/2018 FINAL  Final    Imaging: I reviewed the patient's renal ultrasound with the above findings.  Imp: The patient had acute onset right flank pain with associated nausea and vomiting, right-sided hydronephrosis, and some microscopic hematuria.  She has a history of nephrolithiasis and has a nonobstructing stone in the right lower pole.  I suspect the pain that she is having is related to  an obstructing kidney stone in the ureter.  The location and size of the stone variables that we can answer with the current imaging.  Recommendations: I spoke with the patient and her husband in detail about the treatment options.  Our current plan is to have the patient try to pass the stone through the night and keep her in observation for pain medication as needed.  We have also given her dose of Flomax.  If the patient still has ongoing and persistent right-sided flank pain we will plan to proceed to the operating room tomorrow morning at 8 AM for right ureteral stent placement under general anesthesia.  We will plan to do this procedure using ultrasound guidance instead of fluoroscopy.  Thank you for involving me in this patient's care, I will continue to follow along.  Syon Tews W Armando Lauman  

## 2020-06-28 NOTE — H&P (View-Only) (Signed)
I have been asked to see the patient by Dr. Brennan Bailey, for evaluation and management of right renal colic/hydronephrosis.  History of present illness: 29 year old [redacted] weeks pregnant female who presented to the hospital today with acute onset right-sided flank pain and associated nausea and vomiting.  The patient denied any fevers.  She denies any gross hematuria.  The pain began this morning, and has been unrelenting.  It has been managed with morphine injections IV.  The patient underwent a renal ultrasound demonstrating right hydroureteronephrosis with a nonobstructing 9 mm stone in the right lower pole.  The left side was normal.  The patient's urine analysis had microscopic hematuria but no evidence of infection.  The patient states she has passed numerous stones, the most recent one was during her first trimester of this pregnancy.  She has never seen a urologist.  She is never required any procedures to help passed stone.  The patient's pregnancy has been largely unremarkable other than the stone.  She has been recently diagnosed with gestational diabetes.  During the patient's first pregnancy she had placenta abruption.  Review of systems: A 12 point comprehensive review of systems was obtained and is negative unless otherwise stated in the history of present illness.  Patient Active Problem List   Diagnosis Date Noted  . Flank pain in pregnant patient 06/28/2020  . Postpartum hypertension 06/01/2018  . Postpartum care following vaginal delivery (6/22) 05/29/2018  . Maternal anemia, with delivery 05/28/2018  . SVD 05/27/2018 05/27/2018  . Second-degree perineal laceration, with delivery 05/27/2018  . GDM, class A1 05/26/2018  . Gestational hypertension 05/26/2018  . Encounter for planned induction of labor 05/25/2018  . Abnormal glucose tolerance test (GTT) during pregnancy, antepartum 03/24/2018  . Seasonal and perennial allergic rhinitis 08/31/2016  . GERD (gastroesophageal reflux  disease) 10/18/2013  . IBS (irritable bowel syndrome) 10/18/2013  . Migraine with aura 10/03/2013  . 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 Moderate persistent asthma 08/15/2007    No current facility-administered medications on file prior to encounter.   Current Outpatient Medications on File Prior to Encounter  Medication Sig Dispense Refill  . acetaminophen (TYLENOL) 500 MG tablet Take 1,000 mg by mouth every 6 (six) hours as needed for moderate pain.    . Prenatal Vit-Fe Fumarate-FA (MULTIVITAMIN-PRENATAL) 27-0.8 MG TABS tablet Take 1 tablet by mouth daily at 12 noon.    . busPIRone (BUSPAR) 10 MG tablet Take 1 tablet (10 mg total) by mouth 2 (two) times daily. (Patient not taking: Reported on 06/28/2020) 60 tablet 3  . ferrous sulfate 325 (65 FE) MG EC tablet Take 325 mg by mouth 3 (three) times daily with meals. (Patient not taking: Reported on 06/28/2020)    . ondansetron (ZOFRAN) 4 MG tablet Take 4 mg by mouth every 8 (eight) hours as needed for nausea or vomiting. (Patient not taking: Reported on 06/28/2020)    . ondansetron (ZOFRAN) 4 MG tablet Take 1 tablet (4 mg total) by mouth every 8 (eight) hours as needed for nausea or vomiting. (Patient not taking: Reported on 06/28/2020) 20 tablet 0  . pantoprazole (PROTONIX) 40 MG tablet Take 1 tablet (40 mg total) by mouth daily. (Patient not taking: Reported on 06/28/2020) 30 tablet 3  . PARoxetine (PAXIL) 10 MG tablet Take 1 tablet (10 mg total) by mouth daily. (Patient not taking: Reported on 06/28/2020) 30 tablet 3    Past Medical History:  Diagnosis Date  . Acne   . Allergy  sesonal allergy   . Amblyopia   . Anxiety 2009  . Asthma     no recent difficulties   . Depression    meds years ago  . Kidney stone   . Migraine    migraines - on it years ago    Past Surgical History:  Procedure Laterality Date  . WISDOM TOOTH EXTRACTION      Social History   Tobacco Use  . Smoking status: Never Smoker  . Smokeless  tobacco: Never Used  Substance Use Topics  . Alcohol use: Not Currently    Comment: occasionally; 1 drink weekly  . Drug use: No    Family History  Problem Relation Age of Onset  . Hypertension Mother   . Allergic rhinitis Mother   . Anxiety disorder Mother   . Depression Mother   . Alcohol abuse Father   . Drug abuse Father   . Allergic rhinitis Sister   . Cancer Maternal Grandmother        colon  . Stroke Maternal Grandmother   . Diabetes Maternal Grandmother   . Hypertension Maternal Grandmother   . Cancer Maternal Grandfather        lung cancer  . Stroke Maternal Grandfather   . Cancer Paternal Grandfather        prostate  . Heart disease Other   . Angioedema Neg Hx   . Eczema Neg Hx   . Immunodeficiency Neg Hx   . Urticaria Neg Hx     PE: Vitals:   06/28/20 1107 06/28/20 1122 06/28/20 1450  BP: (!) 122/61  (!) 101/62  Pulse: 89  91  Temp: 97.7 F (36.5 C)  98 F (36.7 C)  TempSrc: Oral  Oral  SpO2: 100% 100% 99%   Patient appears to be in moderate distress patient is alert and oriented x3 Atraumatic normocephalic head No cervical or supraclavicular lymphadenopathy appreciated No increased work of breathing, no audible wheezes/rhonchi Regular sinus rhythm/rate Abdomen is gravid, right CVA tenderness  lower extremities are symmetric without appreciable edema Grossly neurologically intact No identifiable skin lesions  No results for input(s): WBC, HGB, HCT in the last 72 hours. No results for input(s): NA, K, CL, CO2, GLUCOSE, BUN, CREATININE, CALCIUM in the last 72 hours. No results for input(s): LABPT, INR in the last 72 hours. No results for input(s): LABURIN in the last 72 hours. Results for orders placed or performed during the hospital encounter of 06/01/18  Culture, OB Urine     Status: None   Collection Time: 06/01/18  4:46 PM   Specimen: Urine, Random  Result Value Ref Range Status   Specimen Description   Final    URINE, RANDOM Performed  at [facility], [address] [place], [place] [address]    Special Requests   Final    NONE Performed at [facility], [address] [place], [place] [address]    Culture   Final    NO GROWTH NO GROUP B STREP (S.AGALACTIAE) ISOLATED Performed at [facility], [address], [place], [place] [address]    Report Status 06/02/2018 FINAL  Final    Imaging: I reviewed the patient's renal ultrasound with the above findings.  Imp: The patient had acute onset right flank pain with associated nausea and vomiting, right-sided hydronephrosis, and some microscopic hematuria.  She has a history of nephrolithiasis and has a nonobstructing stone in the right lower pole.  I suspect the pain that she is having is related to  an obstructing kidney stone in the ureter.  The location and size of the stone variables that we can answer with the current imaging.  Recommendations: I spoke with the patient and her husband in detail about the treatment options.  Our current plan is to have the patient try to pass the stone through the night and keep her in observation for pain medication as needed.  We have also given her dose of Flomax.  If the patient still has ongoing and persistent right-sided flank pain we will plan to proceed to the operating room tomorrow morning at 8 AM for right ureteral stent placement under general anesthesia.  We will plan to do this procedure using ultrasound guidance instead of fluoroscopy.  Thank you for involving me in this patient's care, I will continue to follow along.  Crist Fat

## 2020-06-28 NOTE — OB Triage Note (Signed)
Pt arrived in triage with c/o right sided back pain that started around 0715 this am immediately followed by vomiting. Pt reports, "I think I have a kidney stone." Pt stated she felt contractions starting en route to hospital, but no pressure. Denies leaking of fluid, vaginal bleeding. Reports good fetal movement, but somewhat decreased since pain. EFM applied and assessing. Initial fhr 140s. Difficulty continuously monitoring due to patient mvtmt.

## 2020-06-29 ENCOUNTER — Observation Stay: Payer: 59 | Admitting: Certified Registered"

## 2020-06-29 ENCOUNTER — Encounter: Admission: EM | Disposition: A | Discharge status: Home / Self Care | Payer: Self-pay | Source: Home / Self Care

## 2020-06-29 DIAGNOSIS — O99891 Other specified diseases and conditions complicating pregnancy: Secondary | ICD-10-CM

## 2020-06-29 DIAGNOSIS — Z3A33 33 weeks gestation of pregnancy: Secondary | ICD-10-CM

## 2020-06-29 DIAGNOSIS — N200 Calculus of kidney: Secondary | ICD-10-CM

## 2020-06-29 DIAGNOSIS — R109 Unspecified abdominal pain: Secondary | ICD-10-CM

## 2020-06-29 DIAGNOSIS — O26833 Pregnancy related renal disease, third trimester: Secondary | ICD-10-CM | POA: Diagnosis not present

## 2020-06-29 DIAGNOSIS — O26899 Other specified pregnancy related conditions, unspecified trimester: Secondary | ICD-10-CM

## 2020-06-29 DIAGNOSIS — N23 Unspecified renal colic: Secondary | ICD-10-CM

## 2020-06-29 DIAGNOSIS — N132 Hydronephrosis with renal and ureteral calculous obstruction: Secondary | ICD-10-CM

## 2020-06-29 HISTORY — PX: CYSTOSCOPY WITH STENT PLACEMENT: SHX5790

## 2020-06-29 LAB — GLUCOSE, CAPILLARY
Glucose-Capillary: 100 mg/dL — ABNORMAL HIGH (ref 70–99)
Glucose-Capillary: 100 mg/dL — ABNORMAL HIGH (ref 70–99)
Glucose-Capillary: 113 mg/dL — ABNORMAL HIGH (ref 70–99)
Glucose-Capillary: 130 mg/dL — ABNORMAL HIGH (ref 70–99)

## 2020-06-29 SURGERY — CYSTOSCOPY, WITH STENT INSERTION
Anesthesia: General | Site: Ureter | Laterality: Right

## 2020-06-29 MED ORDER — FENTANYL CITRATE (PF) 100 MCG/2ML IJ SOLN
INTRAMUSCULAR | Status: AC
  Filled 2020-06-29: qty 2

## 2020-06-29 MED ORDER — SODIUM CHLORIDE 0.9% FLUSH: 9.0000 mL | INTRAVENOUS | Status: DC | PRN

## 2020-06-29 MED ORDER — HYDROMORPHONE 1 MG/ML IV SOLN
INTRAVENOUS | Status: DC
  Administered 2020-06-29 (×2): 0.5 mg via INTRAVENOUS
  Administered 2020-06-29: 25 mg via INTRAVENOUS

## 2020-06-29 MED ORDER — PROPOFOL 10 MG/ML IV BOLUS
INTRAVENOUS | Status: AC
  Filled 2020-06-29: qty 20

## 2020-06-29 MED ORDER — GLYCOPYRROLATE 0.2 MG/ML IJ SOLN
INTRAMUSCULAR | Status: DC | PRN
  Administered 2020-06-29: .1 mg via INTRAVENOUS

## 2020-06-29 MED ORDER — NALOXONE HCL 0.4 MG/ML IJ SOLN: 0.4000 mg | INTRAMUSCULAR | Status: DC | PRN

## 2020-06-29 MED ORDER — PROPOFOL 10 MG/ML IV BOLUS
INTRAVENOUS | Status: DC | PRN
  Administered 2020-06-29: 150 mg via INTRAVENOUS

## 2020-06-29 MED ORDER — ROCURONIUM BROMIDE 100 MG/10ML IV SOLN
INTRAVENOUS | Status: DC | PRN
  Administered 2020-06-29: 5 mg via INTRAVENOUS

## 2020-06-29 MED ORDER — DEXAMETHASONE SODIUM PHOSPHATE 10 MG/ML IJ SOLN
INTRAMUSCULAR | Status: DC | PRN
  Administered 2020-06-29: 10 mg via INTRAVENOUS

## 2020-06-29 MED ORDER — MIDAZOLAM HCL 2 MG/2ML IJ SOLN
INTRAMUSCULAR | Status: AC
  Filled 2020-06-29: qty 2

## 2020-06-29 MED ORDER — PHENYLEPHRINE HCL (PRESSORS) 10 MG/ML IV SOLN
INTRAVENOUS | Status: DC | PRN
  Administered 2020-06-29: 200 ug via INTRAVENOUS
  Administered 2020-06-29: 100 ug via INTRAVENOUS

## 2020-06-29 MED ORDER — CEFAZOLIN SODIUM-DEXTROSE 1-4 GM/50ML-% IV SOLN
INTRAVENOUS | Status: DC | PRN
  Administered 2020-06-29: 2 g via INTRAVENOUS

## 2020-06-29 MED ORDER — ONDANSETRON HCL 4 MG/2ML IJ SOLN
INTRAMUSCULAR | Status: DC | PRN
  Administered 2020-06-29: 4 mg via INTRAVENOUS

## 2020-06-29 MED ORDER — TRAMADOL HCL 50 MG PO TABS: 50.0000 mg | ORAL_TABLET | Freq: Four times a day (QID) | ORAL | 0 refills | Status: DC | PRN

## 2020-06-29 MED ORDER — DIPHENHYDRAMINE HCL 50 MG/ML IJ SOLN: 12.5000 mg | Freq: Four times a day (QID) | INTRAMUSCULAR | Status: DC | PRN

## 2020-06-29 MED ORDER — LIDOCAINE HCL (PF) 2 % IJ SOLN
INTRAMUSCULAR | Status: AC
  Filled 2020-06-29: qty 5

## 2020-06-29 MED ORDER — ONDANSETRON HCL 4 MG/2ML IJ SOLN: 4.0000 mg | Freq: Once | INTRAMUSCULAR | Status: DC | PRN

## 2020-06-29 MED ORDER — PROPOFOL 10 MG/ML IV BOLUS
INTRAVENOUS | Status: AC
  Filled 2020-06-29: qty 80

## 2020-06-29 MED ORDER — GLYCOPYRROLATE 0.2 MG/ML IJ SOLN
INTRAMUSCULAR | Status: AC
  Filled 2020-06-29: qty 1

## 2020-06-29 MED ORDER — SUCCINYLCHOLINE CHLORIDE 20 MG/ML IJ SOLN
INTRAMUSCULAR | Status: DC | PRN
  Administered 2020-06-29: 100 mg via INTRAVENOUS

## 2020-06-29 MED ORDER — LIDOCAINE HCL (CARDIAC) PF 100 MG/5ML IV SOSY
PREFILLED_SYRINGE | INTRAVENOUS | Status: DC | PRN
  Administered 2020-06-29: 100 mg via INTRAVENOUS

## 2020-06-29 MED ORDER — FENTANYL CITRATE (PF) 100 MCG/2ML IJ SOLN
INTRAMUSCULAR | Status: DC | PRN
  Administered 2020-06-29: 50 ug via INTRAVENOUS

## 2020-06-29 MED ORDER — SUGAMMADEX SODIUM 200 MG/2ML IV SOLN
INTRAVENOUS | Status: DC | PRN
  Administered 2020-06-29: 100 mg via INTRAVENOUS

## 2020-06-29 MED ORDER — FENTANYL CITRATE (PF) 100 MCG/2ML IJ SOLN: 25.0000 ug | INTRAMUSCULAR | Status: DC | PRN

## 2020-06-29 MED ORDER — DIPHENHYDRAMINE HCL 12.5 MG/5ML PO ELIX
12.5000 mg | ORAL_SOLUTION | Freq: Four times a day (QID) | ORAL | Status: DC | PRN
  Filled 2020-06-29: qty 5

## 2020-06-29 SURGICAL SUPPLY — 17 items
BAG DRAIN CYSTO-URO LG1000N (MISCELLANEOUS) ×2 IMPLANT
CATH URETL 5X70 OPEN END (CATHETERS) ×2 IMPLANT
GLOVE BIO SURGEON STRL SZ7.5 (GLOVE) ×2 IMPLANT
GOWN STRL REUS W/ TWL LRG LVL3 (GOWN DISPOSABLE) ×2 IMPLANT
GOWN STRL REUS W/TWL LRG LVL3 (GOWN DISPOSABLE) ×4
GUIDEWIRE STR DUAL SENSOR (WIRE) ×2 IMPLANT
PACK CYSTO AR (MISCELLANEOUS) ×2 IMPLANT
SET CYSTO W/LG BORE CLAMP LF (SET/KITS/TRAYS/PACK) ×2 IMPLANT
SOL .9 NS 3000ML IRR  AL (IV SOLUTION) ×2
SOL .9 NS 3000ML IRR AL (IV SOLUTION) ×1
SOL .9 NS 3000ML IRR UROMATIC (IV SOLUTION) ×1 IMPLANT
SOL PREP PVP 2OZ (MISCELLANEOUS) ×2
SOLUTION PREP PVP 2OZ (MISCELLANEOUS) ×1 IMPLANT
STENT URET 6FRX24 CONTOUR (STENTS) ×2 IMPLANT
STENT URET 6FRX26 CONTOUR (STENTS) ×1 IMPLANT
SURGILUBE 2OZ TUBE FLIPTOP (MISCELLANEOUS) ×2 IMPLANT
WATER STERILE IRR 1000ML POUR (IV SOLUTION) ×2 IMPLANT

## 2020-06-29 NOTE — Anesthesia Postprocedure Evaluation (Signed)
Anesthesia Post Note  Patient: Animal nutritionist  Procedure(s) Performed: CYSTOSCOPY WITH STENT PLACEMENT (Right Ureter)  Patient location during evaluation: PACU Anesthesia Type: General Level of consciousness: awake and alert Pain management: pain level controlled Vital Signs Assessment: post-procedure vital signs reviewed and stable Respiratory status: spontaneous breathing and respiratory function stable Cardiovascular status: stable Anesthetic complications: no   No complications documented.   Last Vitals:  Vitals:   06/29/20 0956 06/29/20 1045  BP: (!) 111/64 (!) 100/54  Pulse: 96 (!) 109  Resp: 16   Temp: 36.5 C   SpO2:      Last Pain:  Vitals:   06/29/20 0956  TempSrc: Oral  PainSc: 3                  Haleem Hanner K

## 2020-06-29 NOTE — Anesthesia Procedure Notes (Addendum)
Procedure Name: Intubation Performed by: Mohammed Kindle, CRNA Pre-anesthesia Checklist: Patient identified, Emergency Drugs available, Suction available and Patient being monitored Patient Re-evaluated:Patient Re-evaluated prior to induction Oxygen Delivery Method: Circle system utilized Preoxygenation: Pre-oxygenation with 100% oxygen Induction Type: IV induction and Rapid sequence Laryngoscope Size: McGraph and 3 Grade View: Grade I Tube type: Oral Tube size: 6.5 mm Number of attempts: 1 Airway Equipment and Method: Stylet and Oral airway Placement Confirmation: ETT inserted through vocal cords under direct vision,  positive ETCO2,  breath sounds checked- equal and bilateral and CO2 detector Secured at: 21 cm Tube secured with: Tape Dental Injury: Teeth and Oropharynx as per pre-operative assessment

## 2020-06-29 NOTE — Anesthesia Preprocedure Evaluation (Signed)
Anesthesia Evaluation  Patient identified by MRN, date of birth, ID band Patient awake    Reviewed: Allergy & Precautions, NPO status , Patient's Chart, lab work & pertinent test results  History of Anesthesia Complications Negative for: history of anesthetic complications  Airway Mallampati: II       Dental   Pulmonary asthma (no inhalers in last year) , neg sleep apnea,           Cardiovascular (-) hypertension(-) Past MI and (-) CHF (-) dysrhythmias (-) Valvular Problems/Murmurs     Neuro/Psych neg Seizures Anxiety Depression    GI/Hepatic Neg liver ROS, GERD  Medicated and Poorly Controlled,  Endo/Other  diabetes, Gestational  Renal/GU Renal disease (stones)     Musculoskeletal   Abdominal   Peds  Hematology   Anesthesia Other Findings   Reproductive/Obstetrics (+) Pregnancy (33 weeks)                             Anesthesia Physical Anesthesia Plan  ASA: II and emergent  Anesthesia Plan: General   Post-op Pain Management:    Induction: Intravenous  PONV Risk Score and Plan: 3 and Ondansetron, Dexamethasone and Treatment may vary due to age or medical condition  Airway Management Planned: Oral ETT  Additional Equipment:   Intra-op Plan:   Post-operative Plan:   Informed Consent: I have reviewed the patients History and Physical, chart, labs and discussed the procedure including the risks, benefits and alternatives for the proposed anesthesia with the patient or authorized representative who has indicated his/her understanding and acceptance.       Plan Discussed with:   Anesthesia Plan Comments: (OB MD checked this am. Will do continuous fetal monitoring during case.  )        Anesthesia Quick Evaluation

## 2020-06-29 NOTE — Progress Notes (Signed)
MD notified of patient NST and verbal order given to D/C monitoring. Pt awaiting surgery and vitals stable.

## 2020-06-29 NOTE — Transfer of Care (Signed)
Immediate Anesthesia Transfer of Care Note  Patient: Cheryl Galvan  Procedure(s) Performed: CYSTOSCOPY WITH STENT PLACEMENT (Right Ureter)  Patient Location: PACU  Anesthesia Type:General  Level of Consciousness: awake, alert , oriented and patient cooperative  Airway & Oxygen Therapy: Patient Spontanous Breathing and Patient connected to face mask oxygen  Post-op Assessment: Report given to RN and Post -op Vital signs reviewed and stable  Post vital signs: Reviewed and stable  Last Vitals:  Vitals Value Taken Time  BP 117/70 06/29/20 0911  Temp 36.5 C 06/29/20 0910  Pulse 104 06/29/20 0916  Resp 13 06/29/20 0916  SpO2 100 % 06/29/20 0916  Vitals shown include unvalidated device data.  Last Pain:  Vitals:   06/29/20 0910  TempSrc:   PainSc: Asleep      Patients Stated Pain Goal: 0 (06/29/20 0644)  Complications: No complications documented.

## 2020-06-29 NOTE — OB Triage Note (Signed)
Rn discussed pain medication and management with patient. Pt instructed when to call or return to hospital for evaluation and instructed to keep her OB appointment as scheduled. Pt verbalized understanding of instructions and has no further questions at this time. Pt discharged home in stable condition with family.

## 2020-06-29 NOTE — Progress Notes (Signed)
MD notified of NST results and verbal order given to D/C monitoring. RN to contact urologist for orders for pain medication.

## 2020-06-29 NOTE — Progress Notes (Signed)
Herrick MD notified of pt status and D/C orders given.

## 2020-06-29 NOTE — Discharge Instructions (Signed)
DISCHARGE INSTRUCTIONS FOR KIDNEY STONE/URETERAL STENT   MEDICATIONS:  1. Resume all your other meds from home - except do not take any extra narcotic pain meds that you may have at home.  2. Tramadol is for moderate/severe pain, otherwise taking upto 1000 mg every 6 hours of plainTylenol will help treat your pain.    ACTIVITY:  1. No strenuous activity x 1week  2. No driving while on narcotic pain medications  3. Drink plenty of water  4. Continue to walk at home - you can still get blood clots when you are at home, so keep active, but don't over do it.  5. May return to work/school tomorrow or when you feel ready   BATHING:  1. You can shower and we recommend daily showers   SIGNS/SYMPTOMS TO CALL:  Please call us if you have a fever greater than 101.5, uncontrolled nausea/vomiting, uncontrolled pain, dizziness, unable to urinate, bloody urine, chest pain, shortness of breath, leg swelling, leg pain, redness around wound, drainage from wound, or any other concerns or questions.   You can reach [place] at [phone].   FOLLOW-UP:  1. You will be scheduled for f/u in 2 months for further management of your stones.

## 2020-06-29 NOTE — Op Note (Signed)
Preoperative diagnosis:  1. Right obstructing stone   Postoperative diagnosis:  1. same   Procedure:  1. Cystoscopy 2. right ureteral stent placement - ultrasound guidance   Surgeon: Crist Fat, MD  Anesthesia: General  Complications: None  Intraoperative findings: Once the wire was passed up beyond the stone, there was brisk efflux of urine.  This appeared to be distal in the ureter.  The stent was seen to be in the proximal ureter/lower pole based on intraoperative ultrasound guidance.  EBL: Minimal  Specimens: None  Indication: Cheryl Galvan is a 29 y.o. patient 33-week pregnant patient who presented to the emergency department with right-sided acute onset renal colic pain.  She was admitted for observation and her symptoms did not resolve, worsened.  She was having associated nausea and vomiting.  Ultrasound demonstrated a right hydroureteronephrosis and the urine analysis had microscopic hematuria.  We discussed treatment options assuming she has a obstructing ureteral stone. After reviewing the management options for treatment, she elected to proceed with the above surgical procedure(s). We have discussed the potential benefits and risks of the procedure, side effects of the proposed treatment, the likelihood of the patient achieving the goals of the procedure, and any potential problems that might occur during the procedure or recuperation. Informed consent has been obtained.  Description of procedure:  The patient was taken to the operating room and general anesthesia was induced.  The patient was placed in the dorsal lithotomy position, prepped and draped in the usual sterile fashion, and preoperative antibiotics were administered. A preoperative time-out was performed.   Cystourethroscopy was performed.  The patient's urethra was examined and was normal. The bladder was then systematically examined in its entirety. There was no evidence for any bladder tumors,  stones, or other mucosal pathology.    A 0.38 sensor guidewire was then advanced up the right ureter into the renal pelvis under ultrasound guidance.  The ureteral stent was advance over the wire using Seldinger technique.  The stent was positioned appropriately under ultrasound and cystoscopic guidance.  The wire was then removed with an adequate stent curl noted in the renal pelvis as well as in the bladder.  The bladder was then emptied and the procedure ended.  The patient appeared to tolerate the procedure well and without complications.  The patient was able to be awakened and transferred to the recovery unit in satisfactory condition.    Crist Fat, M.D.

## 2020-06-29 NOTE — Progress Notes (Signed)
Patient ID: Cheryl Galvan, female   DOB: 1991-12-05, 29 y.o.   MRN: [id number]     L&D OB Triage Note  SUBJECTIVE Cheryl Galvan is a 29 y.o. G2P0 female at [redacted]w[redacted]d, EDD Estimated Date of Delivery: 08/17/20 who presented to triage yesterday with complaints of right flank pain and right lower quadrant pain.  She states that she has a history of kidney stones and in fact had a kidney stone earlier in the pregnancy which she passed.  She says that this is mostly a pain consistent with her previous kidney stones although she did report occasional contractions on admission. External fetal monitoring was performed yesterday and was found to be reassuring.  With IV hydration her contractions soon resolved.  She then began a work-up for kidney stones.  She was given IV hydration pain relief and underwent a renal ultrasound. The ultrasound revealed a 9 mm stone in the pole of her right kidney but this was not suspected to be the source of her pain.  Moderate right hydroureter was also noted.  (Please see ultrasound report). I consulted urology for management and last evening he took over her pain management and prepared her to be taken to the OR this morning for stent placement. Before going to the OR I spoke with the patient and she states that her pain is controlled with medication but as soon as the medication begins to wear off she has the return of pain.  I reassured her regarding medications for pain relief during pregnancy and we briefly discussed fetal monitoring before and after her surgery. She plans to return to [facility] for continuation of prenatal care after stent placement.  OB History  Gravida Para Term Preterm AB Living  2 0 0 0 0 1  SAB TAB Ectopic Multiple Live Births  0 0 0 0 1    # Outcome Date GA Lbr Len/2nd Weight Sex Delivery Anes PTL Lv  2 Current           1 Gravida             Medications Prior to Admission  Medication Sig Dispense Refill Last Dose  . acetaminophen (TYLENOL)  500 MG tablet Take 1,000 mg by mouth every 6 (six) hours as needed for moderate pain.   06/28/2020 at 0715  . Prenatal Vit-Fe Fumarate-FA (MULTIVITAMIN-PRENATAL) 27-0.8 MG TABS tablet Take 1 tablet by mouth daily at 12 noon.   Past Week at Unknown time  . busPIRone (BUSPAR) 10 MG tablet Take 1 tablet (10 mg total) by mouth 2 (two) times daily. (Patient not taking: Reported on 06/28/2020) 60 tablet 3 Not Taking at Unknown time  . ferrous sulfate 325 (65 FE) MG EC tablet Take 325 mg by mouth 3 (three) times daily with meals. (Patient not taking: Reported on 06/28/2020)   Not Taking at Unknown time  . ondansetron (ZOFRAN) 4 MG tablet Take 4 mg by mouth every 8 (eight) hours as needed for nausea or vomiting. (Patient not taking: Reported on 06/28/2020)   Not Taking at Unknown time  . ondansetron (ZOFRAN) 4 MG tablet Take 1 tablet (4 mg total) by mouth every 8 (eight) hours as needed for nausea or vomiting. (Patient not taking: Reported on 06/28/2020) 20 tablet 0 Not Taking at Unknown time  . pantoprazole (PROTONIX) 40 MG tablet Take 1 tablet (40 mg total) by mouth daily. (Patient not taking: Reported on 06/28/2020) 30 tablet 3 Not Taking at Unknown time  . PARoxetine (PAXIL) 10 MG tablet Take  1 tablet (10 mg total) by mouth daily. 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      NST was performed and has been reviewed by me.  NST INTERPRETATION: Category I  Mode: Doppler Baseline Rate (A): 145 bpm (fht-post procedure) Variability: Moderate Accelerations: 15 x 15 Decelerations: None     Contraction Frequency (min): 1-2, Uterine Irr  ASSESSMENT Impression:  1.  Pregnancy:  G2P0 at [redacted]w[redacted]d , EDD  Estimated Date of Delivery: 08/17/20 2.  Reassuring fetal and maternal status 3.  Right kidney stone necessitating stent placement by urology  PLAN 1. Discussed current condition and above findings with patient and reassurance given.  All questions answered. 2. Discharge home with standard labor precautions given to return to L&D or call the office for problems. 3. Continue routine prenatal care. 4.  Urology plan to discharge patient after stent placement

## 2020-06-29 NOTE — Interval H&P Note (Signed)
History and Physical Interval Note:  06/29/2020 10:30 AM  Cheryl Galvan  has presented today for surgery, with the diagnosis of Right Ureteral Stone.  The various methods of treatment have been discussed with the patient and family. After consideration of risks, benefits and other options for treatment, the patient has consented to  Procedure(s): CYSTOSCOPY WITH STENT PLACEMENT (Right) as a surgical intervention.  The patient's history has been reviewed, patient examined, no change in status, stable for surgery.  I have reviewed the patient's chart and labs.  Questions were answered to the patient's satisfaction.     Crist Fat

## 2020-06-30 ENCOUNTER — Encounter: Payer: Self-pay | Admitting: Urology

## 2020-07-08 ENCOUNTER — Inpatient Hospital Stay (HOSPITAL_COMMUNITY)
Admission: AD | Admit: 2020-07-08 | Discharge: 2020-07-09 | Disposition: A | Discharge status: Home / Self Care | Payer: 59 | Attending: Obstetrics & Gynecology | Admitting: Obstetrics & Gynecology

## 2020-07-08 ENCOUNTER — Encounter (HOSPITAL_COMMUNITY): Payer: Self-pay | Admitting: Obstetrics & Gynecology

## 2020-07-08 ENCOUNTER — Other Ambulatory Visit: Payer: Self-pay

## 2020-07-08 ENCOUNTER — Inpatient Hospital Stay (HOSPITAL_COMMUNITY): Payer: 59

## 2020-07-08 DIAGNOSIS — O26833 Pregnancy related renal disease, third trimester: Secondary | ICD-10-CM | POA: Insufficient documentation

## 2020-07-08 DIAGNOSIS — Z791 Long term (current) use of non-steroidal anti-inflammatories (NSAID): Secondary | ICD-10-CM | POA: Insufficient documentation

## 2020-07-08 DIAGNOSIS — R319 Hematuria, unspecified: Secondary | ICD-10-CM | POA: Diagnosis present

## 2020-07-08 DIAGNOSIS — Z3689 Encounter for other specified antenatal screening: Secondary | ICD-10-CM | POA: Diagnosis not present

## 2020-07-08 DIAGNOSIS — O2693 Pregnancy related conditions, unspecified, third trimester: Secondary | ICD-10-CM | POA: Diagnosis not present

## 2020-07-08 DIAGNOSIS — Z3A34 34 weeks gestation of pregnancy: Secondary | ICD-10-CM | POA: Insufficient documentation

## 2020-07-08 DIAGNOSIS — N200 Calculus of kidney: Secondary | ICD-10-CM | POA: Diagnosis not present

## 2020-07-08 LAB — URINALYSIS, ROUTINE W REFLEX MICROSCOPIC
Bilirubin Urine: NEGATIVE
Glucose, UA: NEGATIVE mg/dL
Ketones, ur: NEGATIVE mg/dL
Nitrite: NEGATIVE
Protein, ur: NEGATIVE mg/dL
RBC / HPF: >50 RBC/hpf — ABNORMAL HIGH (ref 0–5)
Specific Gravity, Urine: 1.009 (ref 1.005–1.030)
pH: 7 (ref 5.0–8.0)

## 2020-07-08 LAB — CBC
HCT: 33.6 % — ABNORMAL LOW (ref 36.0–46.0)
Hemoglobin: 11.1 g/dL — ABNORMAL LOW (ref 12.0–15.0)
MCH: 28.5 pg (ref 26.0–34.0)
MCHC: 33 g/dL (ref 30.0–36.0)
MCV: 86.4 fL (ref 80.0–100.0)
Platelets: 228 10*3/uL (ref 150–400)
RBC: 3.89 MIL/uL (ref 3.87–5.11)
RDW: 13.6 % (ref 11.5–15.5)
WBC: 10.1 10*3/uL (ref 4.0–10.5)
nRBC: 0 % (ref 0.0–0.2)

## 2020-07-08 NOTE — MAU Note (Addendum)
.  Aireal Slater is a 29 y.o. at [redacted]w[redacted]d here in MAU reporting: what she believes is ureteral bleeding that began at 2030 this evening. Pt reports the first time she urinated it was a pink color, which she states has been normal with her recent right stent that was placed on 06/29/20. At 2100, pt reports 3 or 4 clots when she urinated and states there was some bright red blood in the toilet. Pt states when the surgeon was placing her stent, he felt a kidney stone but it was unable to be removed. Pt also reports she has 32mm kidney stone in her right kidney as well. Pt reports constant pain that she rates 2/10. Pt is taking Tylenol at 1530 and has not taken her Tramadol yet this evening. No abnormal discharge. +FM. Pt reports no pregnancy related concerns.  FHR: 140

## 2020-07-09 DIAGNOSIS — O2693 Pregnancy related conditions, unspecified, third trimester: Secondary | ICD-10-CM

## 2020-07-09 DIAGNOSIS — Z3689 Encounter for other specified antenatal screening: Secondary | ICD-10-CM

## 2020-07-09 DIAGNOSIS — Z3A34 34 weeks gestation of pregnancy: Secondary | ICD-10-CM

## 2020-07-09 DIAGNOSIS — R319 Hematuria, unspecified: Secondary | ICD-10-CM

## 2020-07-09 LAB — CULTURE, OB URINE: Culture: NO GROWTH

## 2020-07-09 MED ORDER — TAMSULOSIN HCL 0.4 MG PO CAPS
0.8000 mg | ORAL_CAPSULE | Freq: Once | ORAL | Status: AC
  Administered 2020-07-09: 0.8 mg via ORAL
  Filled 2020-07-09: qty 2

## 2020-07-09 MED ORDER — TAMSULOSIN HCL 0.4 MG PO CAPS: 0.4000 mg | ORAL_CAPSULE | Freq: Every day | ORAL | 1 refills | Status: DC

## 2020-07-09 NOTE — MAU Provider Note (Addendum)
History     CSN: [id number]  Arrival date and time: 07/08/20 2234   First Provider Initiated Contact with Patient 07/08/20 2326      Chief Complaint  Patient presents with  . Hematuria   Cheryl Galvan is a 29 y.o. G2P0 at [redacted]w[redacted]d who presents to MAU with complaints of hematuria. Patient report hematuria started this evening around 8:30 PM.  Patient reports initially bleeding was light pink-colored which is normal since she had stent placement on 06/29/20.  Patient reports at 9 PM passing 3-4 1 cm clots when she urinated.  She describes the bleeding as bright red in the toilet.  Patient denies increase in pain since the bleeding began.  She reports that she has been in constant pain since finding that she had a kidney stone.  Describes pain as aching in her right flank, rates pain 2 out of 10.  Has prescription for tramadol at home but has not taken tonight.  Took Tylenol around 3:30 PM.  She reports hematuria is associated with dysuria and decreased urination.  She denies any pregnancy related complaints.  She denies contractions, lower abdominal cramping, vaginal bleeding, vaginal discharge.  She reports good fetal movement.  She receives prenatal care at [facility].  OB History    Gravida  2   Para      Term      Preterm      AB      Living  1     SAB      TAB      Ectopic      Multiple      Live Births  1           Past Medical History:  Diagnosis Date  . Acne   . Allergy    sesonal allergy   . Amblyopia   . Anxiety 2009  . Asthma     no recent difficulties   . Depression    meds years ago  . Kidney stone   . Migraine    migraines - on it years ago    Past Surgical History:  Procedure Laterality Date  . CYSTOSCOPY WITH STENT PLACEMENT Right 06/29/2020   Procedure: CYSTOSCOPY WITH STENT PLACEMENT;  Surgeon: Crist Fat, MD;  Location: [facility] ORS;  Service: Urology;  Laterality: Right;  . WISDOM TOOTH EXTRACTION      Family History   Problem Relation Age of Onset  . Hypertension Mother   . Allergic rhinitis Mother   . Anxiety disorder Mother   . Depression Mother   . Alcohol abuse Father   . Drug abuse Father   . Allergic rhinitis Sister   . Cancer Maternal Grandmother        colon  . Stroke Maternal Grandmother   . Diabetes Maternal Grandmother   . Hypertension Maternal Grandmother   . Cancer Maternal Grandfather        lung cancer  . Stroke Maternal Grandfather   . Cancer Paternal Grandfather        prostate  . Heart disease Other   . Angioedema Neg Hx   . Eczema Neg Hx   . Immunodeficiency Neg Hx   . Urticaria Neg Hx     Social History   Tobacco Use  . Smoking status: Never Smoker  . Smokeless tobacco: Never Used  Substance Use Topics  . Alcohol use: Not Currently    Comment: occasionally; 1 drink weekly  . Drug use: No    Allergies: No  Known Allergies  Medications Prior to Admission  Medication Sig Dispense Refill Last Dose  . acetaminophen (TYLENOL) 500 MG tablet Take 1,000 mg by mouth every 6 (six) hours as needed for moderate pain.   07/08/2020 at 1530  . Prenatal Vit-Fe Fumarate-FA (MULTIVITAMIN-PRENATAL) 27-0.8 MG TABS tablet Take 1 tablet by mouth daily at 12 noon.   07/08/2020 at Unknown time  . traMADol (ULTRAM) 50 MG tablet Take 1-2 tablets (50-100 mg total) by mouth every 6 (six) hours as needed for moderate pain. 15 tablet 0 07/07/2020 at Unknown time  . busPIRone (BUSPAR) 10 MG tablet Take 1 tablet (10 mg total) by mouth 2 (two) times daily. (Patient not taking: Reported on 06/28/2020) 60 tablet 3   . ferrous sulfate 325 (65 FE) MG EC tablet Take 325 mg by mouth 3 (three) times daily with meals. (Patient not taking: Reported on 06/28/2020)     . ondansetron (ZOFRAN) 4 MG tablet Take 4 mg by mouth every 8 (eight) hours as needed for nausea or vomiting. (Patient not taking: Reported on 06/28/2020)     . ondansetron (ZOFRAN) 4 MG tablet Take 1 tablet (4 mg total) by mouth every 8 (eight) hours  as needed for nausea or vomiting. (Patient not taking: Reported on 06/28/2020) 20 tablet 0   . pantoprazole (PROTONIX) 40 MG tablet Take 1 tablet (40 mg total) by mouth daily. (Patient not taking: Reported on 06/28/2020) 30 tablet 3   . PARoxetine (PAXIL) 10 MG tablet Take 1 tablet (10 mg total) by mouth daily. (Patient not taking: Reported on 06/28/2020) 30 tablet 3     Review of Systems  Constitutional: Negative.   Respiratory: Negative.   Cardiovascular: Negative.   Gastrointestinal: Negative.   Genitourinary: Positive for decreased urine volume, dysuria, flank pain and hematuria. Negative for vaginal bleeding and vaginal discharge.  Neurological: Negative.   Psychiatric/Behavioral: Negative.    Physical Exam   Blood pressure 131/77, pulse 98, temperature 98.1 F (36.7 C), temperature source Oral, resp. rate 17, unknown if currently breastfeeding.  Physical Exam Vitals and nursing note reviewed.  HENT:     Head: Normocephalic.  Cardiovascular:     Rate and Rhythm: Normal rate and regular rhythm.  Pulmonary:     Effort: Pulmonary effort is normal.     Breath sounds: Normal breath sounds.  Abdominal:     Palpations: Abdomen is soft.     Tenderness: There is right CVA tenderness. There is no left CVA tenderness.     Comments: Gravid appropriate for gestational age, no abdominal tenderness or guarding.   Skin:    General: Skin is warm and dry.  Neurological:     Mental Status: She is alert and oriented to person, place, and time.  Psychiatric:        Mood and Affect: Mood normal.        Behavior: Behavior normal.        Thought Content: Thought content normal.    Fetal Monitoring:  145/moderate/+accels/ no decelerations  Toco UI, no UC   MAU Course  Procedures  MDM Renal US follow up  CBC  UA   Labs and [place] report reviewed:  Results for orders placed or performed during the hospital encounter of 07/08/20 (from the past 24 hour(s))  Urinalysis, Routine w reflex  microscopic     Status: Abnormal   Collection Time: 07/08/20 10:39 PM  Result Value Ref Range   Color, Urine YELLOW YELLOW   APPearance CLEAR CLEAR   Specific  Gravity, Urine 1.009 1.005 - 1.030   pH 7.0 5.0 - 8.0   Glucose, UA NEGATIVE NEGATIVE mg/dL   Hgb urine dipstick LARGE (A) NEGATIVE   Bilirubin Urine NEGATIVE NEGATIVE   Ketones, ur NEGATIVE NEGATIVE mg/dL   Protein, ur NEGATIVE NEGATIVE mg/dL   Nitrite NEGATIVE NEGATIVE   Leukocytes,Ua MODERATE (A) NEGATIVE   RBC / HPF >50 (H) 0 - 5 RBC/hpf   WBC, UA 21-50 0 - 5 WBC/hpf   Bacteria, UA RARE (A) NONE SEEN   Squamous Epithelial / LPF 0-5 0 - 5   Mucus PRESENT   CBC     Status: Abnormal   Collection Time: 07/08/20 11:41 PM  Result Value Ref Range   WBC 10.1 4.0 - 10.5 K/uL   RBC 3.89 3.87 - 5.11 MIL/uL   Hemoglobin 11.1 (L) 12.0 - 15.0 g/dL   HCT 16.133.6 (L) 36 - 46 %   MCV 86.4 80.0 - 100.0 fL   MCH 28.5 26.0 - 34.0 pg   MCHC 33.0 30.0 - 36.0 g/dL   RDW 09.613.6 04.511.5 - 40.915.5 %   Platelets 228 150 - 400 K/uL   nRBC 0.0 0.0 - 0.2 %   US RENAL  Result Date: 07/09/2020 CLINICAL DATA:  Right flank pain. Follow-up from 06/28/2020. Right stent placed 725. Known right renal calculi. Thirty-four weeks pregnant. EXAM: RENAL / URINARY TRACT ULTRASOUND COMPLETE COMPARISON:  06/28/2020 FINDINGS: Right Kidney: Renal measurements: 11.9 x 4.5 x 5.7 cm = volume: 158 mL. The right renal collecting system is decompressed since the previous study. The stone identified previously is not seen today. Left Kidney: Renal measurements: 11.7 x 5.8 x 6 cm = volume: 213 mL. Echogenicity within normal limits. No mass or hydronephrosis visualized. Bladder: The bladder is not visualized due to under distention. Other: None. IMPRESSION: Interval decompression of the right renal collecting system. Previously identified right renal stone is not seen today. Electronically Signed   By: Burman NievesWilliam  Stevens M.D.   On: 07/09/2020 00:09   Review of ultrasound noted previous  right renal stone not seen today.  Questionable ureter blockage by stone.  Considering Flomax to help increase the flow of urine.   Discussed results of ultrasound with Dr. Adrian BlackwaterStinson.  Discussed giving patient Flomax here in MAU and prescription to discharge home.  Dr. Adrian BlackwaterStinson agrees with plan of care.   Discussed with patient results of ultrasound and lab work.  First dose of Flomax given in MAU prior to discharge home.  Rx for Flomax sent to pharmacy of choice.  Encourage patient to follow-up with urology if continued hematuria.  Patient request different urology, order placed for referral to urology.  Followed up as scheduled in the office for prenatal care.  Discussed reasons to return to MAU. Return to MAU as needed. Pt stable at time of discharge.  Assessment and Plan   1. Hematuria without proteinuria   2. Hematuria   3. [redacted] weeks gestation of pregnancy   4. NST (non-stress test) reactive    Discharge home Follow up as scheduled in the office for prenatal care Return to MAU as needed for reasons discussed and/or emergencies  Rx for Flomax    Follow-up Information    Olivia Mackieaavon, Richard, MD Follow up.   Specialty: Obstetrics and Gynecology Why: Follow up as scheduled for prenatal care Contact information: [address] [place] [address] [phone]              Allergies as of 07/09/2020   No Known  Allergies     Medication List    TAKE these medications   acetaminophen 500 MG tablet Commonly known as: TYLENOL Take 1,000 mg by mouth every 6 (six) hours as needed for moderate pain.   busPIRone 10 MG tablet Commonly known as: BUSPAR Take 1 tablet (10 mg total) by mouth 2 (two) times daily.   ferrous sulfate 325 (65 FE) MG EC tablet Take 325 mg by mouth 3 (three) times daily with meals.   multivitamin-prenatal 27-0.8 MG Tabs tablet Take 1 tablet by mouth daily at 12 noon.   ondansetron 4 MG tablet Commonly known as: ZOFRAN Take 4 mg by mouth every 8 (eight)  hours as needed for nausea or vomiting.   ondansetron 4 MG tablet Commonly known as: Zofran Take 1 tablet (4 mg total) by mouth every 8 (eight) hours as needed for nausea or vomiting.   pantoprazole 40 MG tablet Commonly known as: PROTONIX Take 1 tablet (40 mg total) by mouth daily.   PARoxetine 10 MG tablet Commonly known as: PAXIL Take 1 tablet (10 mg total) by mouth daily.   tamsulosin 0.4 MG Caps capsule Commonly known as: FLOMAX Take 1 capsule (0.4 mg total) by mouth daily.   traMADol 50 MG tablet Commonly known as: Ultram Take 1-2 tablets (50-100 mg total) by mouth every 6 (six) hours as needed for moderate pain.       Sharyon Cable CNM 07/09/2020, 12:44 AM

## 2020-07-16 ENCOUNTER — Other Ambulatory Visit: Payer: Self-pay

## 2020-07-16 ENCOUNTER — Inpatient Hospital Stay (HOSPITAL_COMMUNITY)
Admission: AD | Admit: 2020-07-16 | Discharge: 2020-07-16 | Disposition: A | Discharge status: Home / Self Care | Payer: 59 | Attending: Obstetrics | Admitting: Obstetrics

## 2020-07-16 ENCOUNTER — Encounter (HOSPITAL_COMMUNITY): Payer: Self-pay | Admitting: Obstetrics

## 2020-07-16 DIAGNOSIS — R310 Gross hematuria: Secondary | ICD-10-CM | POA: Insufficient documentation

## 2020-07-16 DIAGNOSIS — N200 Calculus of kidney: Secondary | ICD-10-CM | POA: Diagnosis not present

## 2020-07-16 DIAGNOSIS — Z960 Presence of urogenital implants: Secondary | ICD-10-CM | POA: Diagnosis not present

## 2020-07-16 DIAGNOSIS — O26833 Pregnancy related renal disease, third trimester: Secondary | ICD-10-CM | POA: Insufficient documentation

## 2020-07-16 DIAGNOSIS — Z3A35 35 weeks gestation of pregnancy: Secondary | ICD-10-CM | POA: Diagnosis not present

## 2020-07-16 DIAGNOSIS — Z79899 Other long term (current) drug therapy: Secondary | ICD-10-CM | POA: Insufficient documentation

## 2020-07-16 DIAGNOSIS — O99891 Other specified diseases and conditions complicating pregnancy: Secondary | ICD-10-CM

## 2020-07-16 DIAGNOSIS — Z9889 Other specified postprocedural states: Secondary | ICD-10-CM

## 2020-07-16 LAB — CBC
HCT: 35.2 % — ABNORMAL LOW (ref 36.0–46.0)
Hemoglobin: 11.2 g/dL — ABNORMAL LOW (ref 12.0–15.0)
MCH: 27.8 pg (ref 26.0–34.0)
MCHC: 31.8 g/dL (ref 30.0–36.0)
MCV: 87.3 fL (ref 80.0–100.0)
Platelets: 214 10*3/uL (ref 150–400)
RBC: 4.03 MIL/uL (ref 3.87–5.11)
RDW: 13.6 % (ref 11.5–15.5)
WBC: 9.6 10*3/uL (ref 4.0–10.5)
nRBC: 0 % (ref 0.0–0.2)

## 2020-07-16 LAB — BASIC METABOLIC PANEL
Anion gap: 10 (ref 5–15)
BUN: 8 mg/dL (ref 6–20)
CO2: 19 mmol/L — ABNORMAL LOW (ref 22–32)
Calcium: 8.9 mg/dL (ref 8.9–10.3)
Chloride: 108 mmol/L (ref 98–111)
Creatinine, Ser: 0.63 mg/dL (ref 0.44–1.00)
GFR calc Af Amer: >60 mL/min (ref >60)
GFR calc non Af Amer: >60 mL/min (ref >60)
Glucose, Bld: 96 mg/dL (ref 70–99)
Potassium: 3.6 mmol/L (ref 3.5–5.1)
Sodium: 137 mmol/L (ref 135–145)

## 2020-07-16 LAB — URINALYSIS, MICROSCOPIC (REFLEX): RBC / HPF: >50 RBC/hpf (ref 0–5)

## 2020-07-16 LAB — URINALYSIS, ROUTINE W REFLEX MICROSCOPIC

## 2020-07-16 MED ORDER — OXYCODONE-ACETAMINOPHEN 5-325 MG PO TABS
2.0000 | ORAL_TABLET | Freq: Once | ORAL | Status: DC
  Filled 2020-07-16: qty 2

## 2020-07-16 MED ORDER — TRAMADOL HCL 50 MG PO TABS
50.0000 mg | ORAL_TABLET | Freq: Once | ORAL | Status: AC
  Administered 2020-07-16: 50 mg via ORAL
  Filled 2020-07-16: qty 1

## 2020-07-16 MED ORDER — ACETAMINOPHEN 500 MG PO TABS
1000.0000 mg | ORAL_TABLET | Freq: Once | ORAL | Status: AC
  Administered 2020-07-16: 1000 mg via ORAL
  Filled 2020-07-16: qty 2

## 2020-07-16 NOTE — MAU Note (Signed)
Presents with right lower back pain that radiates around into lower right abdomen.  Has been diagnosed with 70mm stone in the right kidney.  Had stint inserted on June 29, 2020.  Reports hematuria x4 days, yellow with blood clots.  Called urologist and no response.  Denies pregnancy related issues, no LOF or VB.  Endorses +FM.

## 2020-07-16 NOTE — Discharge Instructions (Signed)
Kidney Stones  Kidney stones are solid, rock-like deposits that form inside of the kidneys. The kidneys are a pair of organs that make urine. A kidney stone may form in a kidney and move into other parts of the urinary tract, including the tubes that connect the kidneys to the bladder (ureters), the bladder, and the tube that carries urine out of the body (urethra). As the stone moves through these areas, it can cause intense pain and block the flow of urine. Kidney stones are created when high levels of certain minerals are found in the urine. The stones are usually passed out of the body through urination, but in some cases, medical treatment may be needed to remove them. What are the causes? Kidney stones may be caused by:  A condition in which certain glands produce too much parathyroid hormone (primary hyperparathyroidism), which causes too much calcium buildup in the blood.  A buildup of uric acid crystals in the bladder (hyperuricosuria). Uric acid is a chemical that the body produces when you eat certain foods. It usually exits the body in the urine.  Narrowing (stricture) of one or both of the ureters.  A kidney blockage that is present at birth (congenital obstruction).  Past surgery on the kidney or the ureters, such as gastric bypass surgery. What increases the risk? The following factors may make you more likely to develop this condition:  Having had a kidney stone in the past.  Having a family history of kidney stones.  Not drinking enough water.  Eating a diet that is high in protein, salt (sodium), or sugar.  Being overweight or obese. What are the signs or symptoms? Symptoms of a kidney stone may include:  Pain in the side of the abdomen, right below the ribs (flank pain). Pain usually spreads (radiates) to the groin.  Needing to urinate frequently or urgently.  Painful urination.  Blood in the urine (hematuria).  Nausea.  Vomiting.  Fever and chills. How  is this diagnosed? This condition may be diagnosed based on:  Your symptoms and medical history.  A physical exam.  Blood tests.  Urine tests. These may be done before and after the stone passes out of your body through urination.  Imaging tests, such as a CT scan, abdominal X-ray, or ultrasound.  A procedure to examine the inside of the bladder (cystoscopy). How is this treated? Treatment for kidney stones depends on the size, location, and makeup of the stones. Kidney stones will often pass out of the body through urination. You may need to:  Increase your fluid intake to help pass the stone. In some cases, you may be given fluids through an IV and may need to be monitored at the hospital.  Take medicine for pain.  Make changes in your diet to help prevent kidney stones from coming back. Sometimes, medical procedures are needed to remove a kidney stone. This may involve:  A procedure to break up kidney stones using: ? A focused beam of light (laser therapy). ? Shock waves (extracorporeal shock wave lithotripsy).  Surgery to remove kidney stones. This may be needed if you have severe pain or have stones that block your urinary tract. Follow these instructions at home: Medicines  Take over-the-counter and prescription medicines only as told by your health care provider.  Ask your health care provider if the medicine prescribed to you requires you to avoid driving or using heavy machinery. Eating and drinking  Drink enough fluid to keep your urine pale yellow.   You may be instructed to drink at least 8-10 glasses of water each day. This will help you pass the kidney stone.  If directed, change your diet. This may include: ? Limiting how much sodium you eat. ? Eating more fruits and vegetables. ? Limiting how much animal protein--such as red meat, poultry, fish, and eggs--you eat.  Follow instructions from your health care provider about eating or drinking  restrictions. General instructions  Collect urine samples as told by your health care provider. You may need to collect a urine sample: ? 24 hours after you pass the stone. ? 8-12 weeks after passing the kidney stone, and every 6-12 months after that.  Strain your urine every time you urinate, for as long as directed. Use the strainer that your health care provider recommends.  Do not throw out the kidney stone after passing it. Keep the stone so it can be tested by your health care provider. Testing the makeup of your kidney stone may help prevent you from getting kidney stones in the future.  Keep all follow-up visits as told by your health care provider. This is important. You may need follow-up X-rays or ultrasounds to make sure that your stone has passed. How is this prevented? To prevent another kidney stone:  Drink enough fluid to keep your urine pale yellow. This is the best way to prevent kidney stones.  Eat a healthy diet and follow recommendations from your health care provider about foods to avoid. You may be instructed to eat a low-protein diet. Recommendations vary depending on the type of kidney stone that you have.  Maintain a healthy weight. Where to find more information  National Kidney Foundation (NKF): www.kidney.org  [employer] (UCF): www.urologyhealth.org Contact a health care provider if:  You have pain that gets worse or does not get better with medicine. Get help right away if:  You have a fever or chills.  You develop severe pain.  You develop new abdominal pain.  You faint.  You are unable to urinate. Summary  Kidney stones are solid, rock-like deposits that form inside of the kidneys.  Kidney stones can cause nausea, vomiting, blood in the urine, abdominal pain, and the urge to urinate frequently.  Treatment for kidney stones depends on the size, location, and makeup of the stones. Kidney stones will often pass out of the body  through urination.  Kidney stones can be prevented by drinking enough fluids, eating a healthy diet, and maintaining a healthy weight. This information is not intended to replace advice given to you by your health care provider. Make sure you discuss any questions you have with your health care provider. Document Revised: 04/10/2019 Document Reviewed: 04/10/2019 Elsevier Patient Education  2020 [employer]  

## 2020-07-16 NOTE — MAU Provider Note (Signed)
History     [id number]  Arrival date and time: 07/16/20 0953    No chief complaint on file.    HPI Jasmine AweBritany Corigliano is a 29 y.o. at 2845w3d by LMP c/w early ultrasound with extensive PMHx (please refer to problem list for details), who presents for worsening right sided flank pain and hematuria in the setting of known nephrolithiasis s/p stent placement 06/29/28. She initially presented to the MAU 07/03/20 with nausea, emesis, and right sided flank pain and was found to have nonobstructing 9mm stone in the right lower pole. No evidence of infection at that time. Urology was consulted and decision was made to place a stent 06/29/28. Initially after her stent placement pain improved but over the past 2 days pain has acutely worsened, right sided, radiating around to her abdomen, minimally relieved with tramadol and tylenol. Patient states she initially was passing blood clots and now endorses gross hematuria. This pain has been debilitating and is better when she is sitting still, any movement aggrevates the pain. She denies fever/chills. No n/v/d/c. No dysuria, urgency, frequency.  She has no obstetrical complaints today. Denies contractions, VB, LOF. +FM.   Review of outside prenatal records from her Office (in media tab): all prenatal records, imaging and labs reviewed.  Review of all MAU and consult notes from recent MAU presentation.      OB History    Gravida  2   Para      Term      Preterm      AB      Living  1     SAB      TAB      Ectopic      Multiple      Live Births  1           Past Medical History:  Diagnosis Date  . Acne   . Allergy    sesonal allergy   . Amblyopia   . Anxiety 2009  . Asthma     no recent difficulties   . Depression    meds years ago  . Kidney stone   . Migraine    migraines - on it years ago    Past Surgical History:  Procedure Laterality Date  . CYSTOSCOPY WITH STENT PLACEMENT Right 06/29/2020   Procedure: CYSTOSCOPY  WITH STENT PLACEMENT;  Surgeon: Crist FatHerrick, Benjamin W, MD;  Location: [facility] ORS;  Service: Urology;  Laterality: Right;  . WISDOM TOOTH EXTRACTION      Family History  Problem Relation Age of Onset  . Hypertension Mother   . Allergic rhinitis Mother   . Anxiety disorder Mother   . Depression Mother   . Alcohol abuse Father   . Drug abuse Father   . Allergic rhinitis Sister   . Cancer Maternal Grandmother        colon  . Stroke Maternal Grandmother   . Diabetes Maternal Grandmother   . Hypertension Maternal Grandmother   . Cancer Maternal Grandfather        lung cancer  . Stroke Maternal Grandfather   . Cancer Paternal Grandfather        prostate  . Heart disease Other   . Angioedema Neg Hx   . Eczema Neg Hx   . Immunodeficiency Neg Hx   . Urticaria Neg Hx     Social History   Socioeconomic History  . Marital status: Married    Spouse name: Not on file  . Number of children: 0  .  Years of education: Assc.   . Highest education level: Not on file  Occupational History    Employer: OTHER    Comment: [employer]  Tobacco Use  . Smoking status: Never Smoker  . Smokeless tobacco: Never Used  Substance and Sexual Activity  . Alcohol use: Not Currently    Comment: occasionally; 1 drink weekly  . Drug use: No  . Sexual activity: Yes    Birth control/protection: None  Other Topics Concern  . 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   No ets    Caffeine Use: 1 soda daily   Patient lives at home with her family   Social Determinants of Health   Financial Resource Strain:   . Difficulty of Paying Living Expenses:   Food Insecurity:   . Worried About Programme researcher, broadcasting/film/video in the Last Year:   . Barista in the Last Year:   Transportation Needs:   . Freight forwarder (Medical):    Marland Kitchen Lack of Transportation (Non-Medical):   Physical Activity:   . Days of Exercise per Week:   . Minutes of Exercise per Session:   Stress:   . Feeling of Stress :   Social Connections:   . Frequency of Communication with Friends and Family:   . Frequency of Social Gatherings with Friends and Family:   . Attends Religious Services:   . Active Member of Clubs or Organizations:   . Attends Banker Meetings:   Marland Kitchen Marital Status:   Intimate Partner Violence:   . Fear of Current or Ex-Partner:   . Emotionally Abused:   Marland Kitchen Physically Abused:   . Sexually Abused:     No Known Allergies  No current facility-administered medications on file prior to encounter.   Current Outpatient Medications on File Prior to Encounter  Medication Sig Dispense Refill  . acetaminophen (TYLENOL) 500 MG tablet Take 1,000 mg by mouth every 6 (six) hours as needed for moderate pain.    . Prenatal Vit-Fe Fumarate-FA (MULTIVITAMIN-PRENATAL) 27-0.8 MG TABS tablet Take 1 tablet by mouth daily at 12 noon.    . traMADol (ULTRAM) 50 MG tablet Take 1-2 tablets (50-100 mg total) by mouth every 6 (six) hours as needed for moderate pain. 15 tablet 0  . busPIRone (BUSPAR) 10 MG tablet Take 1 tablet (10 mg total) by mouth 2 (two) times daily. (Patient not taking: Reported on 06/28/2020) 60 tablet 3  . ferrous sulfate 325 (65 FE) MG EC tablet Take 325 mg by mouth 3 (three) times daily with meals. (Patient not taking: Reported on 06/28/2020)    . ondansetron (ZOFRAN) 4 MG tablet Take 4 mg by mouth every 8 (eight) hours as needed for nausea or vomiting. (Patient not taking: Reported on 06/28/2020)    . ondansetron (ZOFRAN) 4 MG tablet Take 1 tablet (4 mg total) by mouth every 8 (eight) hours as needed for nausea or vomiting. (Patient not taking: Reported on 06/28/2020) 20 tablet 0  . pantoprazole (PROTONIX) 40 MG tablet Take 1 tablet (40 mg total) by mouth daily. (Patient not taking: Reported on 06/28/2020) 30 tablet 3   . PARoxetine (PAXIL) 10 MG tablet Take 1 tablet (10 mg total) by mouth daily. (Patient not taking: Reported on 06/28/2020) 30 tablet  3  . tamsulosin (FLOMAX) 0.4 MG CAPS capsule Take 1 capsule (0.4 mg total) by mouth daily. 30 capsule 1     ROS Pertinent positives and negative per HPI, all others reviewed and negative  Physical Exam   BP 115/70 (BP Location: Right Arm)   Pulse (!) 108   Temp 98.2 F (36.8 C) (Oral)   Resp 19   Ht 5\' 6"  (1.676 m)   Wt 97.6 kg   SpO2 100%   BMI 34.73 kg/m   Physical Exam Vitals and nursing note reviewed. Exam conducted with a chaperone present.  Constitutional:      General: She is not in acute distress.    Appearance: Normal appearance. She is normal weight.  HENT:     Head: Normocephalic and atraumatic.     Nose: Nose normal.     Mouth/Throat:     Mouth: Mucous membranes are moist.     Pharynx: Oropharynx is clear.  Eyes:     Extraocular Movements: Extraocular movements intact.     Conjunctiva/sclera: Conjunctivae normal.  Cardiovascular:     Rate and Rhythm: Normal rate.     Pulses: Normal pulses.  Pulmonary:     Effort: Pulmonary effort is normal.  Abdominal:     Palpations: Abdomen is soft.     Tenderness: There is abdominal tenderness (RUQ/RLQ). There is right CVA tenderness. There is no left CVA tenderness, guarding or rebound.  Musculoskeletal:        General: Normal range of motion.     Cervical back: Normal range of motion and neck supple.  Skin:    General: Skin is warm and dry.  Neurological:     General: No focal deficit present.     Mental Status: She is alert and oriented to person, place, and time. Mental status is at baseline.  Psychiatric:        Mood and Affect: Mood normal.        Behavior: Behavior normal.     Cervical Exam Dilation: Closed Effacement (%): Thick Cervical Position: Posterior Exam by:: [email or web address], MD  Bedside Ultrasound Not indicated  My interpretation: n/a  FHT Baseline  150bpm, mod variability, + accels, no decels Toco: quiet Cat: 1  Labs Results for orders placed or performed during the hospital encounter of 07/16/20 (from the past 24 hour(s))  Urinalysis, Routine w reflex microscopic Urine, Clean Catch     Status: Abnormal   Collection Time: 07/16/20 10:12 AM  Result Value Ref Range   Color, Urine RED (A) YELLOW   APPearance TURBID (A) CLEAR   Specific Gravity, Urine  1.005 - 1.030    TEST NOT REPORTED DUE TO COLOR INTERFERENCE OF URINE PIGMENT   pH  5.0 - 8.0    TEST NOT REPORTED DUE TO COLOR INTERFERENCE OF URINE PIGMENT   Glucose, UA (A) NEGATIVE mg/dL    TEST NOT REPORTED DUE TO COLOR INTERFERENCE OF URINE PIGMENT   Hgb urine dipstick (A) NEGATIVE    TEST NOT REPORTED DUE TO COLOR INTERFERENCE OF URINE PIGMENT   Bilirubin Urine (A) NEGATIVE    TEST NOT REPORTED DUE TO COLOR INTERFERENCE OF URINE PIGMENT   Ketones, ur (A) NEGATIVE mg/dL    TEST NOT REPORTED DUE TO COLOR INTERFERENCE OF URINE PIGMENT   Protein, ur (A) NEGATIVE mg/dL    TEST NOT REPORTED DUE TO COLOR INTERFERENCE OF URINE PIGMENT   Nitrite (A) NEGATIVE    TEST NOT REPORTED DUE TO COLOR INTERFERENCE OF URINE PIGMENT  Leukocytes,Ua (A) NEGATIVE    TEST NOT REPORTED DUE TO COLOR INTERFERENCE OF URINE PIGMENT  Urinalysis, Microscopic (reflex)     Status: Abnormal   Collection Time: 07/16/20 10:12 AM  Result Value Ref Range   RBC / HPF >50 0 - 5 RBC/hpf   WBC, UA 0-5 0 - 5 WBC/hpf   Bacteria, UA RARE (A) NONE SEEN   Squamous Epithelial / LPF 0-5 0 - 5    Imaging No results found.  MAU Course  Procedures  Lab Orders     OB Urine Culture     Urinalysis, Routine w reflex microscopic Urine, Clean Catch     Urinalysis, Microscopic (reflex)     CBC     Basic metabolic panel Meds ordered this encounter  Medications  . traMADol (ULTRAM) tablet 50 mg   Imaging Orders  No imaging studies ordered today    MDM moderate  Assessment and Plan   28yo at [redacted]w[redacted]d presents  for worsening right sided flank pain, radiating to her abdomen after recent right sided nephrolithiasis s/p stent placement. Patient has had worsening of pain and gross hematuria, which initially had improved after stent placement but have since worsened. In the MAU patient is hemodynamically stable, her exam is remarkable for right sided abdominal ttp and right sided cva tenderness. CBC demonstrated stable hgb, BMP demonstrated Cr stable from prior. Patient's cervix was closed on exam, no contractions on monitoring and no ob related complaints today, do not suspect preterm labor. Discussed case with [facility] physician on call, Dr. Marlou Porch who stated that symptoms likely due to patient's stent, but given the risk of preterm labor she is likely unable to tolerate stone/stent removal at this time. Further imaging unlikely to change management, given known nephrolithiasis and stent in place. Dr. Marlou Porch was called to bedside to discuss the option of a nephrostomy tube which may relieve some of the patient's symptoms and further management, however, patient made decision to leave MAU after waiting 2.5 hours prior to speaking with urologist. Case also discussed with CNM on call for patient's OBGYN who was in agreement with plan. Given that patient would like to leave MAU, she is encouraged to follow up with [facility] for further evaluation and management of symptoms. She was discharged hemodynamically stable with strict return precautions.  Dispo: discharge home  #FWB FHT Cat 1 NST: reactive  Rozann Lesches, MD OB Fellow, Faculty Practice [facility], [facility] for [facility] 07/16/2020 1:10 PM

## 2020-07-17 ENCOUNTER — Other Ambulatory Visit: Payer: Self-pay | Admitting: Urology

## 2020-07-17 LAB — CULTURE, OB URINE: Culture: <10000 — AB

## 2020-07-17 MED ORDER — TRAMADOL HCL 50 MG PO TABS: 50.0000 mg | ORAL_TABLET | Freq: Four times a day (QID) | ORAL | 0 refills | Status: DC | PRN

## 2020-07-18 ENCOUNTER — Other Ambulatory Visit: Payer: Self-pay | Admitting: Urology

## 2020-07-26 ENCOUNTER — Inpatient Hospital Stay (HOSPITAL_COMMUNITY)
Admission: AD | Admit: 2020-07-26 | Discharge: 2020-07-30 | DRG: 806 | Disposition: A | Discharge status: Home / Self Care | Payer: 59 | Attending: Obstetrics and Gynecology | Admitting: Obstetrics and Gynecology

## 2020-07-26 ENCOUNTER — Inpatient Hospital Stay (HOSPITAL_BASED_OUTPATIENT_CLINIC_OR_DEPARTMENT_OTHER): Payer: 59

## 2020-07-26 ENCOUNTER — Encounter (HOSPITAL_COMMUNITY): Payer: Self-pay | Admitting: Obstetrics and Gynecology

## 2020-07-26 ENCOUNTER — Other Ambulatory Visit: Payer: Self-pay

## 2020-07-26 DIAGNOSIS — F419 Anxiety disorder, unspecified: Secondary | ICD-10-CM | POA: Diagnosis present

## 2020-07-26 DIAGNOSIS — J4540 Moderate persistent asthma, uncomplicated: Secondary | ICD-10-CM | POA: Diagnosis present

## 2020-07-26 DIAGNOSIS — O368130 Decreased fetal movements, third trimester, not applicable or unspecified: Secondary | ICD-10-CM | POA: Diagnosis not present

## 2020-07-26 DIAGNOSIS — O24420 Gestational diabetes mellitus in childbirth, diet controlled: Secondary | ICD-10-CM | POA: Diagnosis present

## 2020-07-26 DIAGNOSIS — O289 Unspecified abnormal findings on antenatal screening of mother: Secondary | ICD-10-CM

## 2020-07-26 DIAGNOSIS — O368120 Decreased fetal movements, second trimester, not applicable or unspecified: Secondary | ICD-10-CM | POA: Diagnosis not present

## 2020-07-26 DIAGNOSIS — O24410 Gestational diabetes mellitus in pregnancy, diet controlled: Secondary | ICD-10-CM | POA: Diagnosis not present

## 2020-07-26 DIAGNOSIS — O9952 Diseases of the respiratory system complicating childbirth: Secondary | ICD-10-CM | POA: Diagnosis present

## 2020-07-26 DIAGNOSIS — O99344 Other mental disorders complicating childbirth: Secondary | ICD-10-CM | POA: Diagnosis present

## 2020-07-26 DIAGNOSIS — Z3490 Encounter for supervision of normal pregnancy, unspecified, unspecified trimester: Secondary | ICD-10-CM | POA: Diagnosis present

## 2020-07-26 DIAGNOSIS — Z0000 Encounter for general adult medical examination without abnormal findings: Secondary | ICD-10-CM

## 2020-07-26 DIAGNOSIS — Z20822 Contact with and (suspected) exposure to covid-19: Secondary | ICD-10-CM | POA: Diagnosis present

## 2020-07-26 DIAGNOSIS — O368190 Decreased fetal movements, unspecified trimester, not applicable or unspecified: Secondary | ICD-10-CM | POA: Diagnosis present

## 2020-07-26 DIAGNOSIS — Z3A36 36 weeks gestation of pregnancy: Secondary | ICD-10-CM

## 2020-07-26 DIAGNOSIS — Z960 Presence of urogenital implants: Secondary | ICD-10-CM

## 2020-07-26 DIAGNOSIS — O26833 Pregnancy related renal disease, third trimester: Secondary | ICD-10-CM | POA: Diagnosis present

## 2020-07-26 DIAGNOSIS — N200 Calculus of kidney: Secondary | ICD-10-CM | POA: Diagnosis present

## 2020-07-26 DIAGNOSIS — Z3A37 37 weeks gestation of pregnancy: Secondary | ICD-10-CM

## 2020-07-26 LAB — TYPE AND SCREEN
ABO/RH(D): O POS
Antibody Screen: NEGATIVE

## 2020-07-26 LAB — URINALYSIS, ROUTINE W REFLEX MICROSCOPIC
Bacteria, UA: NONE SEEN
Bilirubin Urine: NEGATIVE
Glucose, UA: NEGATIVE mg/dL
Ketones, ur: 5 mg/dL — AB
Nitrite: NEGATIVE
Protein, ur: 100 mg/dL — AB
RBC / HPF: >50 RBC/hpf — ABNORMAL HIGH (ref 0–5)
Specific Gravity, Urine: 1.012 (ref 1.005–1.030)
pH: 7 (ref 5.0–8.0)

## 2020-07-26 LAB — RPR: RPR Ser Ql: NONREACTIVE

## 2020-07-26 LAB — CBC
HCT: 32.5 % — ABNORMAL LOW (ref 36.0–46.0)
Hemoglobin: 10.2 g/dL — ABNORMAL LOW (ref 12.0–15.0)
MCH: 27.8 pg (ref 26.0–34.0)
MCHC: 31.4 g/dL (ref 30.0–36.0)
MCV: 88.6 fL (ref 80.0–100.0)
Platelets: 189 10*3/uL (ref 150–400)
RBC: 3.67 MIL/uL — ABNORMAL LOW (ref 3.87–5.11)
RDW: 14.1 % (ref 11.5–15.5)
WBC: 12 10*3/uL — ABNORMAL HIGH (ref 4.0–10.5)
nRBC: 0 % (ref 0.0–0.2)

## 2020-07-26 LAB — GLUCOSE, CAPILLARY
Glucose-Capillary: 139 mg/dL — ABNORMAL HIGH (ref 70–99)
Glucose-Capillary: 107 mg/dL — ABNORMAL HIGH (ref 70–99)
Glucose-Capillary: 136 mg/dL — ABNORMAL HIGH (ref 70–99)
Glucose-Capillary: 108 mg/dL — ABNORMAL HIGH (ref 70–99)

## 2020-07-26 LAB — SARS CORONAVIRUS 2 BY RT PCR (HOSPITAL ORDER, PERFORMED IN ~~LOC~~ HOSPITAL LAB): SARS Coronavirus 2: NEGATIVE

## 2020-07-26 MED ORDER — DOCUSATE SODIUM 100 MG PO CAPS
100.0000 mg | ORAL_CAPSULE | Freq: Every day | ORAL | Status: DC
  Administered 2020-07-26 – 2020-07-27 (×2): 100 mg via ORAL
  Filled 2020-07-26 (×2): qty 1

## 2020-07-26 MED ORDER — ACETAMINOPHEN 500 MG PO TABS
1000.0000 mg | ORAL_TABLET | Freq: Four times a day (QID) | ORAL | Status: DC | PRN
  Administered 2020-07-26 – 2020-07-27 (×3): 1000 mg via ORAL
  Filled 2020-07-26 (×3): qty 2

## 2020-07-26 MED ORDER — PRENATAL MULTIVITAMIN CH
1.0000 | ORAL_TABLET | Freq: Every day | ORAL | Status: DC
  Administered 2020-07-26 – 2020-07-27 (×2): 1 via ORAL
  Filled 2020-07-26 (×2): qty 1

## 2020-07-26 MED ORDER — ZOLPIDEM TARTRATE 5 MG PO TABS: 5.0000 mg | ORAL_TABLET | Freq: Every evening | ORAL | Status: DC | PRN

## 2020-07-26 MED ORDER — CALCIUM CARBONATE ANTACID 500 MG PO CHEW: 2.0000 | CHEWABLE_TABLET | ORAL | Status: DC | PRN

## 2020-07-26 MED ORDER — TRAMADOL HCL 50 MG PO TABS
50.0000 mg | ORAL_TABLET | Freq: Four times a day (QID) | ORAL | Status: DC | PRN
  Administered 2020-07-26 – 2020-07-28 (×7): 50 mg via ORAL
  Filled 2020-07-26: qty 2
  Filled 2020-07-26 (×4): qty 1
  Filled 2020-07-26: qty 2

## 2020-07-26 MED ORDER — LACTATED RINGERS IV BOLUS
1000.0000 mL | Freq: Once | INTRAVENOUS | Status: AC
  Administered 2020-07-26: 1000 mL via INTRAVENOUS

## 2020-07-26 MED ORDER — TAMSULOSIN HCL 0.4 MG PO CAPS
0.4000 mg | ORAL_CAPSULE | Freq: Every day | ORAL | Status: DC
  Administered 2020-07-26 – 2020-07-29 (×3): 0.4 mg via ORAL
  Filled 2020-07-26 (×5): qty 1

## 2020-07-26 NOTE — H&P (Signed)
OB ADMISSION/ HISTORY & PHYSICAL:  Admission Date: 07/26/2020  6:29 AM  Admit Diagnosis: Decreased fetal movement [O36.8190]    Cheryl Galvan is a 29 y.o. female presenting for decreased fetal movement. Pt states she woke up at 0330 this AM and by 0600 she had not felt the baby move. Pt called the on call service and was instructed to come to MAU. Denies leaking of fluid or vaginal bleeding. Denies contractions or cramping. Expecting a baby boy. Spouse at the bedside and supportive.   Prenatal History: G2P1001   EDC : 08/17/2020 Prenatal care at [facility] since 8 weeks  Prenatal course complicated by: 1. A1GDM - poorly controlled, noncompliant 2. Kidney stones - ureter stent placed 06/29/2020 3. Intractable pain - on Tramadol 4. Asthma, stable off meds 5. 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 History of PPD, stable off meds 7. Migraines  Prenatal Labs: ABO, Rh:   O POS Antibody: NEG (08/21 0910) Rubella:   Immune RPR: NON REACTIVE (08/21 0918)  HBsAg:   Neg HIV:   Neg GBS:   Negative 1 hr Glucola : 149, failed 3 hour, A1GDM, poorly controlled Genetic Screening: AFP1 negative, NIPS WNL, XY Ultrasound: normal anatomy, WNL AFI, vertex    Maternal Diabetes: Yes:  Diabetes Type:  Diet controlled Genetic Screening: Normal Maternal Ultrasounds/Referrals: Normal Fetal Ultrasounds or other Referrals:  None Maternal Substance Abuse:  No Significant Maternal Medications:  Meds include: Other:  Tramadol Significant Maternal Lab Results:  Group B Strep negative Other Comments:  None  Medical / Surgical History :  Past medical history:  Past Medical History:  Diagnosis Date   Acne    Allergy    sesonal allergy    Amblyopia    Anxiety 2009   Asthma     no recent difficulties    Depression    meds years ago   Kidney stone    Migraine    migraines - on it years ago    Past surgical history:  Past Surgical History:  Procedure Laterality Date   CYSTOSCOPY WITH STENT PLACEMENT  Right 06/29/2020   Procedure: CYSTOSCOPY WITH STENT PLACEMENT;  Surgeon: Crist Fat, MD;  Location: [facility] ORS;  Service: Urology;  Laterality: Right;   WISDOM TOOTH EXTRACTION      Family History:  Family History  Problem Relation Age of Onset   Hypertension Mother    Allergic rhinitis Mother    Anxiety disorder Mother    Depression Mother    Alcohol abuse Father    Drug abuse Father    Allergic rhinitis Sister    Cancer Maternal Grandmother        colon   Stroke Maternal Grandmother    Diabetes Maternal Grandmother    Hypertension Maternal Grandmother    Cancer Maternal Grandfather        lung cancer   Stroke Maternal Grandfather    Cancer Paternal Grandfather        prostate   Heart disease Other    Angioedema Neg Hx    Eczema Neg Hx    Immunodeficiency Neg Hx    Urticaria Neg Hx     Social History:  reports that she has never smoked. She has never used smokeless tobacco. She reports previous alcohol use. She reports that she does not use drugs.  Allergies: Patient has no known allergies.   Current Medications at time of admission:  Medications Prior to Admission  Medication Sig Dispense Refill Last Dose   acetaminophen (TYLENOL)  500 MG tablet Take 1,000 mg by mouth every 6 (six) hours as needed for moderate pain.   07/25/2020 at Unknown time   Prenatal Vit-Fe Fumarate-FA (MULTIVITAMIN-PRENATAL) 27-0.8 MG TABS tablet Take 1 tablet by mouth daily at 12 noon.   07/25/2020 at Unknown time   traMADol (ULTRAM) 50 MG tablet Take 1-2 tablets (50-100 mg total) by mouth every 6 (six) hours as needed for moderate pain. 20 tablet 0 07/25/2020 at Unknown time   tamsulosin (FLOMAX) 0.4 MG CAPS capsule Take 1 capsule (0.4 mg total) by mouth daily. 30 capsule 1     Review of Systems: Review of Systems  Constitutional: Negative for chills and fever.  HENT: Negative for congestion and sore throat.   Respiratory: Negative for cough and shortness of breath.   Cardiovascular:  Negative for chest pain and leg swelling.  Gastrointestinal: Positive for heartburn.  Genitourinary: Positive for flank pain and hematuria.  Musculoskeletal: Negative for back pain.  Neurological: Negative for dizziness and headaches.  Psychiatric/Behavioral: Negative for depression. The patient is nervous/anxious.    Physical Exam: Vital signs and nursing notes reviewed.  Patient Vitals for the past 24 hrs:  BP Temp Temp src Pulse Resp SpO2  07/26/20 0953 117/67 98.3 F (36.8 C) Oral 91 18 100 %  07/26/20 0915 111/71 -- -- 96 -- --  07/26/20 0831 120/80 -- -- 90 -- --  07/26/20 0829 -- -- -- -- -- 100 %  07/26/20 0645 128/84 -- -- 91 19 --  07/26/20 0643 123/80 -- -- (!) 128 -- --    General: AAO x 3, NAD, tearful and anxious Heart: RRR Lungs:CTAB Abdomen: Gravid, NT Extremities: no edema Genitalia / VE:   deferred  FHR: 135BPM, mod variability, + accels, no decels TOCO: Ctx occasional  Labs:   Pending T&S, CBC, RPR  Recent Labs    07/26/20 0918  WBC 12.0*  HGB 10.2*  HCT 32.5*  PLT 189   Assessment/Plan:  28 y.o. G2P1001 at [redacted]w[redacted]d, decreased fetal movement, s/p BMZ 8/20 and 8/21  Fetal Well-Being  - Equivocal NST in MAU, reactive after BPP  - BPP 4/8, 2 off for tone, 2 off for breathing  - S/P BMZ series, last dose 07/25/2020  - Continuous fetal monitoring  - Repeat BPP in 24 hrs.  A1GDM  - Poorly controlled with diet  - Fasting and 2 hour post prandial blood sugars. Nursing to call provider if fasting      >120 or PP >150.  - Carb modified diet  - Expect transient increase in blood sugar due to recent BMZ  Kidney Stent  - Continue Flomax 0.4mg  daily  - LR 1000mg  bolus completed  - Pt scheduled for follow-up surgery with urology after delivery  Intractable Pain  - Tylenol 1000mg  q 6 hours prn for mild pain  - Continue Tramadol 50-100mg  q 6 hours prn for moderate pain  - Nursing to call provider if severe pain  - Ambien 5mg  prn for sleep  Admit to  antepartum for observation  Routine antepartum orders Pt is scheduled for IOL on 07/29/2020  Dr. aware of admission and plan of care.   CNM, MSN 07/26/2020, 11:57 AM

## 2020-07-26 NOTE — MAU Note (Signed)
Pt reports to MAU stating she has had no FM since 2200. No bleeding or LOF. Pt reports her BS at home was 153 fasting. Pt reports she had a BMZ yesterday in the office.

## 2020-07-27 ENCOUNTER — Observation Stay (HOSPITAL_BASED_OUTPATIENT_CLINIC_OR_DEPARTMENT_OTHER): Payer: 59

## 2020-07-27 DIAGNOSIS — Z3490 Encounter for supervision of normal pregnancy, unspecified, unspecified trimester: Secondary | ICD-10-CM | POA: Diagnosis present

## 2020-07-27 DIAGNOSIS — O24410 Gestational diabetes mellitus in pregnancy, diet controlled: Secondary | ICD-10-CM

## 2020-07-27 DIAGNOSIS — Z3A37 37 weeks gestation of pregnancy: Secondary | ICD-10-CM | POA: Diagnosis not present

## 2020-07-27 DIAGNOSIS — O368130 Decreased fetal movements, third trimester, not applicable or unspecified: Secondary | ICD-10-CM

## 2020-07-27 DIAGNOSIS — O289 Unspecified abnormal findings on antenatal screening of mother: Secondary | ICD-10-CM

## 2020-07-27 DIAGNOSIS — O24420 Gestational diabetes mellitus in childbirth, diet controlled: Secondary | ICD-10-CM | POA: Diagnosis present

## 2020-07-27 DIAGNOSIS — J4540 Moderate persistent asthma, uncomplicated: Secondary | ICD-10-CM | POA: Diagnosis present

## 2020-07-27 DIAGNOSIS — F419 Anxiety disorder, unspecified: Secondary | ICD-10-CM | POA: Diagnosis present

## 2020-07-27 DIAGNOSIS — N200 Calculus of kidney: Secondary | ICD-10-CM | POA: Diagnosis present

## 2020-07-27 DIAGNOSIS — O9952 Diseases of the respiratory system complicating childbirth: Secondary | ICD-10-CM | POA: Diagnosis present

## 2020-07-27 DIAGNOSIS — O99344 Other mental disorders complicating childbirth: Secondary | ICD-10-CM | POA: Diagnosis present

## 2020-07-27 DIAGNOSIS — Z20822 Contact with and (suspected) exposure to covid-19: Secondary | ICD-10-CM | POA: Diagnosis present

## 2020-07-27 DIAGNOSIS — O26833 Pregnancy related renal disease, third trimester: Secondary | ICD-10-CM | POA: Diagnosis present

## 2020-07-27 LAB — CBC
HCT: 32.9 % — ABNORMAL LOW (ref 36.0–46.0)
Hemoglobin: 10.4 g/dL — ABNORMAL LOW (ref 12.0–15.0)
MCH: 27.8 pg (ref 26.0–34.0)
MCHC: 31.6 g/dL (ref 30.0–36.0)
MCV: 88 fL (ref 80.0–100.0)
Platelets: 225 10*3/uL (ref 150–400)
RBC: 3.74 MIL/uL — ABNORMAL LOW (ref 3.87–5.11)
RDW: 14.2 % (ref 11.5–15.5)
WBC: 11.8 10*3/uL — ABNORMAL HIGH (ref 4.0–10.5)
nRBC: 0 % (ref 0.0–0.2)

## 2020-07-27 LAB — GLUCOSE, CAPILLARY
Glucose-Capillary: 78 mg/dL (ref 70–99)
Glucose-Capillary: 123 mg/dL — ABNORMAL HIGH (ref 70–99)
Glucose-Capillary: 97 mg/dL (ref 70–99)
Glucose-Capillary: 93 mg/dL (ref 70–99)

## 2020-07-27 MED ORDER — DIPHENHYDRAMINE HCL 50 MG/ML IJ SOLN: 12.5000 mg | INTRAMUSCULAR | Status: DC | PRN

## 2020-07-27 MED ORDER — EPHEDRINE 5 MG/ML INJ: 10.0000 mg | INTRAVENOUS | Status: DC | PRN

## 2020-07-27 MED ORDER — OXYTOCIN BOLUS FROM INFUSION
333.0000 mL | Freq: Once | INTRAVENOUS | Status: AC
  Administered 2020-07-28: 333 mL via INTRAVENOUS

## 2020-07-27 MED ORDER — OXYTOCIN-SODIUM CHLORIDE 30-0.9 UT/500ML-% IV SOLN
2.5000 [IU]/h | INTRAVENOUS | Status: DC
  Administered 2020-07-28: 2.5 [IU]/h via INTRAVENOUS

## 2020-07-27 MED ORDER — OXYCODONE-ACETAMINOPHEN 5-325 MG PO TABS: 2.0000 | ORAL_TABLET | ORAL | Status: DC | PRN

## 2020-07-27 MED ORDER — INSULIN NPH (HUMAN) (ISOPHANE) 100 UNIT/ML ~~LOC~~ SUSP: 2.0000 [IU] | Freq: Two times a day (BID) | SUBCUTANEOUS | Status: DC

## 2020-07-27 MED ORDER — OXYCODONE-ACETAMINOPHEN 5-325 MG PO TABS: 1.0000 | ORAL_TABLET | ORAL | Status: DC | PRN

## 2020-07-27 MED ORDER — ONDANSETRON HCL 4 MG/2ML IJ SOLN: 4.0000 mg | Freq: Four times a day (QID) | INTRAMUSCULAR | Status: DC | PRN

## 2020-07-27 MED ORDER — LACTATED RINGERS IV SOLN: 500.0000 mL | INTRAVENOUS | Status: DC | PRN

## 2020-07-27 MED ORDER — OXYTOCIN-SODIUM CHLORIDE 30-0.9 UT/500ML-% IV SOLN
1.0000 m[IU]/min | INTRAVENOUS | Status: DC
  Administered 2020-07-27: 2 m[IU]/min via INTRAVENOUS
  Filled 2020-07-27: qty 500

## 2020-07-27 MED ORDER — ACETAMINOPHEN 325 MG PO TABS: 650.0000 mg | ORAL_TABLET | ORAL | Status: DC | PRN

## 2020-07-27 MED ORDER — INSULIN ASPART 100 UNIT/ML ~~LOC~~ SOLN: 0.0000 [IU] | SUBCUTANEOUS | Status: DC

## 2020-07-27 MED ORDER — LIDOCAINE HCL (PF) 1 % IJ SOLN: 30.0000 mL | INTRAMUSCULAR | Status: DC | PRN

## 2020-07-27 MED ORDER — FENTANYL-BUPIVACAINE-NACL 0.5-0.125-0.9 MG/250ML-% EP SOLN
12.0000 mL/h | EPIDURAL | Status: DC | PRN
  Filled 2020-07-27: qty 250

## 2020-07-27 MED ORDER — PHENYLEPHRINE 40 MCG/ML (10ML) SYRINGE FOR IV PUSH (FOR BLOOD PRESSURE SUPPORT): 80.0000 ug | PREFILLED_SYRINGE | INTRAVENOUS | Status: DC | PRN

## 2020-07-27 MED ORDER — TERBUTALINE SULFATE 1 MG/ML IJ SOLN: 0.2500 mg | Freq: Once | INTRAMUSCULAR | Status: DC | PRN

## 2020-07-27 MED ORDER — SOD CITRATE-CITRIC ACID 500-334 MG/5ML PO SOLN: 30.0000 mL | ORAL | Status: DC | PRN

## 2020-07-27 MED ORDER — PHENYLEPHRINE 40 MCG/ML (10ML) SYRINGE FOR IV PUSH (FOR BLOOD PRESSURE SUPPORT)
80.0000 ug | PREFILLED_SYRINGE | INTRAVENOUS | Status: DC | PRN
  Filled 2020-07-27: qty 10

## 2020-07-27 MED ORDER — LACTATED RINGERS IV SOLN: INTRAVENOUS | Status: DC

## 2020-07-27 MED ORDER — LACTATED RINGERS IV SOLN: 500.0000 mL | Freq: Once | INTRAVENOUS | Status: DC

## 2020-07-27 NOTE — Progress Notes (Signed)
Called pt to update Plan for delivery, waiting on l/d room; pt will likely be called up in couple hrs or so, when staff available Pt asking if ok to proceed with svd, if there is any concern that baby won't tolerate labor - based on FHT there is no current concern for fetal intolerance to labor and ok to proceed, pt verbalizes understanding. Also reviewed that I am on call until 5pm, that Dr Conni Elliot will be taking over her care and will be managing her IOL. Pt understanding and in agreement with plan.

## 2020-07-27 NOTE — Progress Notes (Signed)
Patient ID: Cheryl Galvan, female   DOB: 1991/06/29, 29 y.o.   MRN: [id number]  HPI: Patient is a 28Y G2P1001 @ 37.0 pregnancy complicated by GDMA1 and nephrolithiasis with subsequent ureteral stent placement. She is now HD#2 originally admitted to antepartum service for equivocal BPP after presenting with complaints of dec FM. FHT has remained reassuring, cat I throughout hospital admission. Repeat BPP performed today was 8/10 however after consultation with MFM, final recommendation was proceed with IOL. Patient has been counseled on risks/benefits/alternatives and is in agreement with IOL at this time.   At bedside, patient is comfortable. Denies feeling any regular/painful ctxs. She is still not feeling great movement from baby, but tracing remains reactive. She is planning on epidural for labor pain mgmt. Of note, patient reports PPH with first pregnancy requiring additional uterotonics and PP IV iron but denies any required blood transfusions or additional surgical procedures.  Objective:  --FHT: cat I baseline 140 bpm mod var +accels, no decels --Toco: uterine irritability  --SVE: 2/50/-3 soft posterior --Adequate pelvis  --EFW 8# last growth [place] 8/17  A/P: 28Y G2P1001 @ 37.0 IOL GDMA1, nephrolithiasis, BPP 8/10 GBS-/Rh+/RI  --Admit to LD floor for IOL mgmt --Reviewed IOL plan with patient: favorable cervix, will start with Pitocin 2x2 titrate per protocol --Continuous EFM/Toco --Cat I tracing  --Patient may have epidural when requested --GDMA1: FSG monitoring q4hr in latent labor, q1hr in active labor, insulin sliding scale protocol coverage  --Clear diabetic diet --Routine intrapartum care, anticipate vaginal delivery  Plan of care reviewed with patient and all questions answered to her satisfaction

## 2020-07-27 NOTE — Progress Notes (Signed)
Cheryl Galvan is a 29 y.o. G2P1001 at [redacted]w[redacted]d IOL    Subjective: Cheryl Galvan is feeling ctx regularly now. Mostly in her lower back. Has been managing pain with ambulation and position changes. Currently rating ctxs a 4 out of 10. Some bloody show, no LOF.   Objective: BP 119/80   Pulse 97   Temp 98 F (36.7 C) (Oral)   Resp 18   SpO2 100%  No intake/output data recorded. No intake/output data recorded.  FHT:  FHR: 150 bpm, variability: moderate,  accelerations:  Present,  decelerations:  Absent UC:   irregular, every 2-5 minutes SVE:   Dilation: 3 Effacement (%): 50 Station: -2 Exam by:: Dr Conni Elliot    AROM for clear fluid  Labs: Lab Results  Component Value Date   WBC 12.0 (H) 07/26/2020   HGB 10.2 (L) 07/26/2020   HCT 32.5 (L) 07/26/2020   MCV 88.6 07/26/2020   PLT 189 07/26/2020    Assessment / Plan: 28Y G2P1001 @ 37.0 IOL Induction of labor due to [facility] medical conditions,  progressing well on pitocin. Nephrolithiasis with ureteral stent placed. GDMA1. Equivocal BPP after presenting with dec FM.  Labor: Progressing normally, s/p AROM and Pitocin at 36mU cont to titrate per protocol Fetal Wellbeing:  Category I Pain Control:  Labor support without medications, may have epidural when requested I/D:  n/a Anticipated MOD:  NSVD  GDMA1: FSG monitoring q4hr in latent and q2hr in active labor with insulin sliding scale coverage per protocol   Cheryl Galvan 07/27/2020, 9:40 PM

## 2020-07-27 NOTE — Progress Notes (Addendum)
No ctx/vb/lof +fm though states not as active as used to be  Temp:  [97.6 F (36.4 C)-98.1 F (36.7 C)] 97.7 F (36.5 C) (08/22 1036) Pulse Rate:  [74-95] 85 (08/22 1036) Resp:  [18] 18 (08/22 1036) BP: (102-117)/(60-72) 117/67 (08/22 1036) SpO2:  [98 %-100 %] 100 % (08/22 1036)   A&ox3 nml respirations Abd: soft,nt,gravid LE: no edema, nt bilat  Results for orders placed or performed during the hospital encounter of 07/26/20 (from the past 24 hour(s))  Glucose, capillary     Status: Abnormal   Collection Time: 07/26/20  1:16 PM  Result Value Ref Range   Glucose-Capillary 107 (H) 70 - 99 mg/dL  Glucose, capillary     Status: Abnormal   Collection Time: 07/26/20  4:24 PM  Result Value Ref Range   Glucose-Capillary 136 (H) 70 - 99 mg/dL  Glucose, capillary     Status: Abnormal   Collection Time: 07/26/20  9:20 PM  Result Value Ref Range   Glucose-Capillary 108 (H) 70 - 99 mg/dL  Glucose, capillary     Status: None   Collection Time: 07/27/20  7:15 AM  Result Value Ref Range   Glucose-Capillary 78 70 - 99 mg/dL  Glucose, capillary     Status: Abnormal   Collection Time: 07/27/20  9:19 AM  Result Value Ref Range   Glucose-Capillary 123 (H) 70 - 99 mg/dL   Comment 1 Notify RN    Comment 2 Document in Chart    FHT: 130s, nml variability, +accels, no decels (during contin monitoring and nst x3) TOCO: rare ctx BPP: pending, prelim 8/10, 2 off for tone  A/P: iup at 37 wga 1. Decreased FM with equivocal BPP - admitted for obs, had reactive monitoring and frequent accelerations, monitoring changed to q shift and still very reactive/cat 1, no decels, 130s; discussed very reassuring FHT, uncertain as to why decreased tone (possibly d/t steroids), improved bpp from yesterday, FHT not c/w asphyxia/fetus in disress; consider d/c home with repeat bpp in am 2. S/p bmz with second dose 2 d ago 3. Kidney stones - tramadol prn for intractable pain; s/p stent and on flomax 4. GDMA1 -  poorly controlled, h/o non-compliance IOL planned in 2 days  Called by Dr Grace Bushy re: bpp; reviewed her clinical course and reassuring FHT essentially after BPP done, and throughout observation, bpp 8/10 with 2 off for tone; he is uncertain as to cause for decreased tone, feels less likely d/t steroids, agrees that FHT is reassuring, recommends obs overnight with continued monitoring tid d/t uncertain cause for decreased tone; ok to contin plan for IOL Tues  Discussed above recommendation with patient - she does not want to stay admitted for obs, she is now 54 wga, was planning IOL in 2 days, and now with decreased tone persisting she wants to proceed with delivery.  The patient is already 20 wga and s/p bmz completed 2 days ago, plan for delivery at 37 wga based on her intractable pain with stents, will proceed with IOL.

## 2020-07-28 ENCOUNTER — Other Ambulatory Visit (HOSPITAL_COMMUNITY): Payer: 59

## 2020-07-28 ENCOUNTER — Inpatient Hospital Stay (HOSPITAL_COMMUNITY): Payer: 59 | Admitting: Anesthesiology

## 2020-07-28 ENCOUNTER — Inpatient Hospital Stay (HOSPITAL_COMMUNITY): Payer: 59

## 2020-07-28 ENCOUNTER — Encounter (HOSPITAL_COMMUNITY): Payer: Self-pay | Admitting: Obstetrics and Gynecology

## 2020-07-28 DIAGNOSIS — Z0000 Encounter for general adult medical examination without abnormal findings: Secondary | ICD-10-CM

## 2020-07-28 LAB — RPR: RPR Ser Ql: NONREACTIVE

## 2020-07-28 LAB — GLUCOSE, CAPILLARY
Glucose-Capillary: 81 mg/dL (ref 70–99)
Glucose-Capillary: 98 mg/dL (ref 70–99)

## 2020-07-28 MED ORDER — BENZOCAINE-MENTHOL 20-0.5 % EX AERO
1.0000 "application " | INHALATION_SPRAY | CUTANEOUS | Status: DC | PRN
  Administered 2020-07-28: 1 via TOPICAL
  Filled 2020-07-28: qty 56

## 2020-07-28 MED ORDER — SODIUM CHLORIDE (PF) 0.9 % IJ SOLN
INTRAMUSCULAR | Status: DC | PRN
Start: 2020-07-28 — End: 2020-07-28
  Administered 2020-07-28: 12 mL/h via EPIDURAL

## 2020-07-28 MED ORDER — COCONUT OIL OIL
1.0000 "application " | TOPICAL_OIL | Status: DC | PRN
  Administered 2020-07-28: 1 via TOPICAL

## 2020-07-28 MED ORDER — SIMETHICONE 80 MG PO CHEW: 80.0000 mg | CHEWABLE_TABLET | ORAL | Status: DC | PRN

## 2020-07-28 MED ORDER — IBUPROFEN 600 MG PO TABS
600.0000 mg | ORAL_TABLET | Freq: Four times a day (QID) | ORAL | Status: DC
  Administered 2020-07-28 – 2020-07-30 (×9): 600 mg via ORAL
  Filled 2020-07-28 (×9): qty 1

## 2020-07-28 MED ORDER — SENNOSIDES-DOCUSATE SODIUM 8.6-50 MG PO TABS
2.0000 | ORAL_TABLET | ORAL | Status: DC
  Administered 2020-07-28 – 2020-07-30 (×2): 2 via ORAL
  Filled 2020-07-28 (×2): qty 2

## 2020-07-28 MED ORDER — LIDOCAINE HCL (PF) 1 % IJ SOLN
INTRAMUSCULAR | Status: DC | PRN
  Administered 2020-07-28: 10 mL via EPIDURAL

## 2020-07-28 MED ORDER — ACETAMINOPHEN 325 MG PO TABS
650.0000 mg | ORAL_TABLET | ORAL | Status: DC | PRN
  Administered 2020-07-28 – 2020-07-30 (×4): 650 mg via ORAL
  Filled 2020-07-28 (×4): qty 2

## 2020-07-28 MED ORDER — WITCH HAZEL-GLYCERIN EX PADS: 1.0000 "application " | MEDICATED_PAD | CUTANEOUS | Status: DC | PRN

## 2020-07-28 MED ORDER — DIAZEPAM 5 MG PO TABS
10.0000 mg | ORAL_TABLET | Freq: Every day | ORAL | Status: DC
  Filled 2020-07-28: qty 2

## 2020-07-28 MED ORDER — DIPHENHYDRAMINE HCL 25 MG PO CAPS: 25.0000 mg | ORAL_CAPSULE | Freq: Four times a day (QID) | ORAL | Status: DC | PRN

## 2020-07-28 MED ORDER — ONDANSETRON HCL 4 MG/2ML IJ SOLN: 4.0000 mg | INTRAMUSCULAR | Status: DC | PRN

## 2020-07-28 MED ORDER — ONDANSETRON HCL 4 MG PO TABS: 4.0000 mg | ORAL_TABLET | ORAL | Status: DC | PRN

## 2020-07-28 MED ORDER — DIBUCAINE (PERIANAL) 1 % EX OINT: 1.0000 "application " | TOPICAL_OINTMENT | CUTANEOUS | Status: DC | PRN

## 2020-07-28 MED ORDER — PRENATAL MULTIVITAMIN CH
1.0000 | ORAL_TABLET | Freq: Every day | ORAL | Status: DC
  Administered 2020-07-28 – 2020-07-30 (×3): 1 via ORAL
  Filled 2020-07-28 (×3): qty 1

## 2020-07-28 MED ORDER — ZOLPIDEM TARTRATE 5 MG PO TABS: 5.0000 mg | ORAL_TABLET | Freq: Every evening | ORAL | Status: DC | PRN

## 2020-07-28 MED ORDER — TRAMADOL HCL 50 MG PO TABS
50.0000 mg | ORAL_TABLET | Freq: Four times a day (QID) | ORAL | Status: DC
  Administered 2020-07-29: 50 mg via ORAL
  Filled 2020-07-28 (×2): qty 1

## 2020-07-28 NOTE — Anesthesia Preprocedure Evaluation (Signed)
Anesthesia Evaluation  Patient identified by MRN, date of birth, ID band Patient awake    Reviewed: Allergy & Precautions, H&P , NPO status , Patient's Chart, lab work & pertinent test results  History of Anesthesia Complications Negative for: history of anesthetic complications  Airway Mallampati: II  TM Distance: >3 FB Neck ROM: full    Dental no notable dental hx.    Pulmonary asthma ,    Pulmonary exam normal        Cardiovascular negative cardio ROS Normal cardiovascular exam Rhythm:regular Rate:Normal     Neuro/Psych Depression negative neurological ROS     GI/Hepatic negative GI ROS, Neg liver ROS,   Endo/Other  diabetes, Gestational  Renal/GU Renal disease (nephrolithiasis s/p stent)  negative genitourinary   Musculoskeletal   Abdominal   Peds  Hematology negative hematology ROS (+)   Anesthesia Other Findings   Reproductive/Obstetrics (+) Pregnancy                             Anesthesia Physical Anesthesia Plan  ASA: II  Anesthesia Plan: Epidural   Post-op Pain Management:    Induction:   PONV Risk Score and Plan:   Airway Management Planned:   Additional Equipment:   Intra-op Plan:   Post-operative Plan:   Informed Consent: I have reviewed the patients History and Physical, chart, labs and discussed the procedure including the risks, benefits and alternatives for the proposed anesthesia with the patient or authorized representative who has indicated his/her understanding and acceptance.       Plan Discussed with:   Anesthesia Plan Comments:         Anesthesia Quick Evaluation

## 2020-07-28 NOTE — Lactation Note (Signed)
This note was copied from a baby's chart. Lactation Consultation Note  Patient Name: Cheryl Galvan NATFT'D Date: 07/28/2020 Reason for consult: Initial assessment;Other (Comment);Early term 72-38.6wks;Maternal endocrine disorder (DAT (+)) Type of Endocrine Disorder?: Diabetes (GDM (diet))  Visited with mom of a 61 hours old ETI female, she's a P2 and experienced BF. She BF her first child for 14 months and the only BF difficulty that she reported is that her son developed a blister due to the latch; but mom reported that the latch was not painful to her at all, she was just concerned about the baby's blister; which seems to also happens with this baby. She has a Spectra DEBP at home.   Baby doing STS with mom when entering the room, baby just had his bath. Mom wished not to disturb baby, and told LC she'd rather work on pumping once baby is done doing STS. Mom aware of baby's DAT (+) status, she also voiced that she's planning on doing a pump rental for the first month because she's having surgery in two weeks and she would like to pump with a hospital grade pump instead of her Spectra for the time being.  LC set up a DEBP, instructions, cleaning and storage were reviewed. Mom has large breast and nipples, the size # 27 flange seems appropriate at this point. LC also requested coconut oil for her and instructed to apply it to her nipples prior pumping. GOB took baby from mom so she can start pumping per her request, per mom baby had already fed. Asked mom to call for assistance when needed.  Mom still pumping when exiting the room and colostrum was noted on the flanges, especially the left one; praised her for her efforts. Reviewed normal newborn behavior, cluster feeding, feeding cues, size of baby's stomach, pumping schedule, newborn jaundice and pump rentals (gift shop).  Feeding plan:  1. Encouraged mom to keep feeding baby STS 8-12 times/24 hours or sooner if feeding cues are present 2.  She'll pump every 3 hours after feedings and will offer any amount of EBM she may get 3. If baby's bilirubin levels start increasing rapidly and needs more volume than the one provided by mom; she'll do DONOR MILK, she wishes to keep baby on a breastmilk diet.  BF brochure, BF resources and feeding diary were reviewed. GOB was mom's support person at the time of [facility] consultation. Family reported all questions and concerns were answered, they're aware of LC OP services and will call PRN.  Maternal Data Formula Feeding for Exclusion: No Has patient been taught Hand Expression?: Yes Does the patient have breastfeeding experience prior to this delivery?: Yes  Feeding Feeding Type: Breast Fed  LATCH Score                   Interventions Interventions: Breast feeding basics reviewed;Skin to skin;DEBP;Coconut oil;Hand express  Lactation Tools Discussed/Used Tools: Flanges;Pump;Coconut oil Flange Size: 27 Breast pump type: Double-Electric Breast Pump WIC Program: No Pump Review: Setup, frequency, and cleaning;Milk Storage Initiated by:: MPeck Date initiated:: 07/28/20   Consult Status Consult Status: Follow-up Date: 07/29/20 Follow-up type: In-patient    Kaylina Cahue Venetia Constable 07/28/2020, 5:33 PM

## 2020-07-28 NOTE — Progress Notes (Signed)
Advised by CRNA on post op rounds that patient was experiencing LEFT sided back pain and had a unilateral block.   I went to see her in Room 514 where she was resting with baby.  Her only complaint is Left sided pain over epidural site.  On exam no signs of inflammation over the site.  She does however have significant para-spinous muscle tenderness to the left of the epidural site.   I reassured her that the pain was  common and will resolve within 24-48 hours.  We will start her back on tramadol q6 and add a Valium for sleep and muscle relaxation this evening. I told her I was on this weekend if the pain persists.  ej Barrie Wale jr mdpa [phone]

## 2020-07-28 NOTE — Anesthesia Postprocedure Evaluation (Signed)
Anesthesia Post Note  Patient: Animal nutritionist  Procedure(s) Performed: AN AD HOC LABOR EPIDURAL     Patient location during evaluation: Mother Baby Anesthesia Type: Epidural Level of consciousness: awake and alert and oriented Pain management: satisfactory to patient Vital Signs Assessment: post-procedure vital signs reviewed and stable Respiratory status: respiratory function stable Cardiovascular status: stable Postop Assessment: no headache, epidural receding, patient able to bend at knees, no signs of nausea or vomiting, adequate PO intake and able to ambulate Anesthetic complications: no   No complications documented.  Last Vitals:  Vitals:   07/28/20 0800 07/28/20 1145  BP: 115/77 115/76  Pulse: 93 78  Resp: 18 16  Temp: 36.9 C 36.7 C  SpO2: 100%     Last Pain:  Vitals:   07/28/20 1145  TempSrc: Oral  PainSc: 3    Pain Goal: Patients Stated Pain Goal: 2 (07/28/20 0800)                 Karleen Dolphin

## 2020-07-28 NOTE — Anesthesia Procedure Notes (Signed)
Epidural Patient location during procedure: OB Start time: 07/28/2020 12:05 AM End time: 07/28/2020 12:15 AM  Staffing Anesthesiologist: Lucretia Kern, MD Performed: anesthesiologist   Preanesthetic Checklist Completed: patient identified, IV checked, risks and benefits discussed, monitors and equipment checked, pre-op evaluation and timeout performed  Epidural Patient position: sitting Prep: DuraPrep Patient monitoring: heart rate, continuous pulse ox and blood pressure Approach: midline Location: L3-L4 Injection technique: LOR air  Needle:  Needle type: Tuohy  Needle gauge: 17 G Needle length: 9 cm Needle insertion depth: 7 cm Catheter type: closed end flexible Catheter size: 19 Gauge Catheter at skin depth: 12 cm Test dose: negative  Assessment Events: blood not aspirated, injection not painful, no injection resistance, no paresthesia and negative IV test  Additional Notes Reason for block:procedure for pain

## 2020-07-29 DIAGNOSIS — Z960 Presence of urogenital implants: Secondary | ICD-10-CM

## 2020-07-29 LAB — CBC
HCT: 31.8 % — ABNORMAL LOW (ref 36.0–46.0)
Hemoglobin: 10 g/dL — ABNORMAL LOW (ref 12.0–15.0)
MCH: 27.9 pg (ref 26.0–34.0)
MCHC: 31.4 g/dL (ref 30.0–36.0)
MCV: 88.8 fL (ref 80.0–100.0)
Platelets: 189 10*3/uL (ref 150–400)
RBC: 3.58 MIL/uL — ABNORMAL LOW (ref 3.87–5.11)
RDW: 14.2 % (ref 11.5–15.5)
WBC: 12.7 10*3/uL — ABNORMAL HIGH (ref 4.0–10.5)
nRBC: 0 % (ref 0.0–0.2)

## 2020-07-29 MED ORDER — CYCLOBENZAPRINE HCL 5 MG PO TABS
7.5000 mg | ORAL_TABLET | Freq: Three times a day (TID) | ORAL | Status: DC | PRN
  Filled 2020-07-29: qty 1.5

## 2020-07-29 MED ORDER — FLUOXETINE HCL 20 MG PO CAPS
20.0000 mg | ORAL_CAPSULE | Freq: Every day | ORAL | Status: DC
  Administered 2020-07-30: 20 mg via ORAL
  Filled 2020-07-29 (×2): qty 1

## 2020-07-29 NOTE — Progress Notes (Signed)
PPD # 1 S/P NSVD  Live born female  Birth Weight: 8 lb 0.9 oz (3654 g) APGAR: 5, 7  Newborn Delivery   Birth date/time: 07/28/2020 04:24:00 Delivery type: Vaginal, Spontaneous     Baby name: Kennedy Bucker Delivering provider: LAW, CASSANDRA A  Episiotomy:None   Lacerations:1st degree   circumcision completed  Feeding: breast  Pain control at delivery: Epidural   S:  Reports feeling back pain on left side from epidural placement, unable to bend in shower and wash her feet. Tramadol not fully effective. Has been evaluated by anesthesia service and advised expect resolution in next 1-2 days.              Tolerating po/ No nausea or vomiting             Bleeding is light             Up ad lib / ambulatory / voiding without difficulties   Newborn on phototherapy for bili levels.  O:  A & O x 3, in no apparent distress              VS:  Vitals:   07/28/20 1145 07/28/20 1545 07/28/20 2125 07/29/20 0505  BP: 115/76 117/79 110/79 108/76  Pulse: 78 87 88 (!) 103  Resp: 16 18 16 18   Temp: 98 F (36.7 C) 97.7 F (36.5 C) 98.4 F (36.9 C) 98.1 F (36.7 C)  TempSrc: Oral Oral Axillary Oral  SpO2:   100% 100%    LABS:  Recent Labs    07/27/20 2229 07/29/20 0442  WBC 11.8* 12.7*  HGB 10.4* 10.0*  HCT 32.9* 31.8*  PLT 225 189    Blood type: --/--/O POS (08/21 0910)  Rubella:   Immune  I&O: I/O last 3 completed shifts: In: 0  Out: 1850 [Urine:1550; Blood:300]          No intake/output data recorded.  Vaccines: TDaP declined         Flu    declined   Gen: AAO x 3, NAD  Abdomen: soft, non-tender, non-distended             Fundus: firm, non-tender, U-1  Perineum: repair intact  Lochia: small  Extremities: no edema, no calf pain or tenderness    A/P: PPD # 1 28 y.o., 08-19-1987   Principal Problem:   Postpartum care following vaginal delivery 8/23 Active Problems:   Encounter for induction of labor   SVD (spontaneous vaginal delivery) 8/23   Status post placement of  ureteral stent  - CT scan completed yesterday and findings to urologist  - has F/U 08/08/2020 for outpatient surgery w/ urology  - continue Flomax in meantime   Perineal laceration with delivery, first degree Back pain  - comfort measures  - rx Flexeril for pain control Hx PPD  - desires to start prophylactic SSRI, used Paxil last time but did not care for side effects.   - reviewed sx of anxiety, may trial Prozac and agrees.   - Start Prozac 20 mg PO daily, onboarding 1 tab q other day x 1 week to decrease side effects with start   Doing well - stable status  Routine post partum orders  Breastfeeding support   - LC assist with S&S feedings with donor breastmilk  Anticipate discharge tomorrow    10/08/2020, MSN, CNM 07/29/2020, 10:31 AM

## 2020-07-29 NOTE — Progress Notes (Signed)
CSW received consult for hx of PPD, Anxiety and Depression.  CSW met with MOB to offer support and complete assessment.    CSW congratulated MOB and FOB on the birth of infant. CSW advised MOB of CSW's role and the reason for visit. MOB reported that she was diagnosed with anxiety and depression "years ago". MOB reported that she was diagnosed with PPD in July 2019 two weeks after the birth of her oldest child. MOB reported that she felt overwhelmed and "like I just couldn't keep up". CSW validated that these can be feelings of PPD. MOB reported that these feelings lasted for a few months. MOB also associates her PPD with "having a traumatic birth with my first child". MOB reported that she thinks that this increased her anxiety and depression as well as aided in PPD. MOB reported that she has no other mental health hx and denies SI, HI and DV to this CSW.MOB disclosed to this CSW that she has been in contact with her OB provider and will be getting a perception for medication before sh e leaves "just to stay on top of PPD".   MOB reported that she has support from her spouse as well as their families. MOB expressed that she has all needed items to care for infant once arrived home.   CSW provided education regarding the baby blues period vs. perinatal mood disorders, discussed treatment and gave resources for mental health follow up if concerns arise.  CSW recommends self-evaluation during the postpartum time period using the New Mom Checklist from Postpartum Progress and encouraged MOB to contact a medical professional if symptoms are noted at any time.   CSW provided review of Sudden Infant Death Syndrome (SIDS) precautions.  MOB reports that infant will sleep in basinet ad crib once arrived home.    CSW identifies no further need for intervention and no barriers to discharge at this time.   Virgie Dad Dulcemaria Bula, MSW, LCSW [facility] [place] [phone]

## 2020-07-29 NOTE — Lactation Note (Signed)
This note was copied from a baby's chart. Lactation Consultation Note  Patient Name: Cheryl Galvan PZWCH'E Date: 07/29/2020 Reason for consult: Follow-up assessment    Infant is 58 hours old and is under photo tx . Mother is desiring to supplement infant. She doesn't want to use any nipples. She request to use a SNS with donor milk.   Mother sat up with a #5 fr feeding tube and 12 ml of donor milk. Infant quickly took 8 ml but then started crying. Mother request to use another method. She was then taught to do finger feeding with a curved tip syringe. She and her mother finger fed infant the rest of the 4 ml of donor milk.  Mother also offered a cup to use but declined.   Mother to continue to pump after feeding and supplement with ebm and donor milk. Encouraged mother to offer supplement according to guidelines and continue to cue base feed infant.      Maternal Data    Feeding Feeding Type: Breast Fed  LATCH Score Latch: Grasps breast easily, tongue down, lips flanged, rhythmical sucking.  Audible Swallowing: Spontaneous and intermittent  Type of Nipple: Everted at rest and after stimulation  Comfort (Breast/Nipple): Soft / non-tender  Hold (Positioning): Assistance needed to correctly position infant at breast and maintain latch.  LATCH Score: 9  Interventions    Lactation Tools Discussed/Used Tools: 63F feeding tube / Syringe   Consult Status Consult Status: Follow-up Date: 07/30/20 Follow-up type: In-patient    Stevan Born [facility] 07/29/2020, 2:07 PM

## 2020-07-29 NOTE — Anesthesia Postprocedure Evaluation (Signed)
Anesthesia Post Note  Patient: Animal nutritionist  Procedure(s) Performed: AN AD HOC LABOR EPIDURAL     Patient location during evaluation: Mother Baby Anesthesia Type: Epidural Level of consciousness: awake and alert Pain management: pain level controlled Vital Signs Assessment: post-procedure vital signs reviewed and stable Respiratory status: spontaneous breathing, nonlabored ventilation and respiratory function stable Cardiovascular status: stable Postop Assessment: no headache, able to ambulate, patient able to bend at knees, adequate PO intake and no apparent nausea or vomiting Anesthetic complications: no Comments: Spoke with patient again today about residual left side back pain. Pt stated that tramadol is helping, but making her sleepy. She was encouraged to use warm packs, massage, and stretching. She seemed satisfied with care. I did encourage her to contact anesthesia if anything changes.    No complications documented.  Last Vitals:  Vitals:   07/28/20 2125 07/29/20 0505  BP: 110/79 108/76  Pulse: 88 (!) 103  Resp: 16 18  Temp: 36.9 C 36.7 C  SpO2: 100% 100%    Last Pain:  Vitals:   07/29/20 0810  TempSrc:   PainSc: 3    Pain Goal: Patients Stated Pain Goal: 2 (07/28/20 1545)                 Junious Silk

## 2020-07-30 ENCOUNTER — Inpatient Hospital Stay (HOSPITAL_COMMUNITY): Payer: 59

## 2020-07-30 ENCOUNTER — Inpatient Hospital Stay (HOSPITAL_COMMUNITY): Admission: AD | Admit: 2020-07-30 | Payer: 59 | Source: Home / Self Care | Admitting: Obstetrics and Gynecology

## 2020-07-30 ENCOUNTER — Encounter (HOSPITAL_COMMUNITY): Payer: Self-pay

## 2020-07-30 MED ORDER — SENNOSIDES-DOCUSATE SODIUM 8.6-50 MG PO TABS: 2.0000 | ORAL_TABLET | ORAL | Status: DC

## 2020-07-30 MED ORDER — ACETAMINOPHEN 500 MG PO TABS: 1000.0000 mg | ORAL_TABLET | Freq: Four times a day (QID) | ORAL | 2 refills | Status: AC | PRN

## 2020-07-30 MED ORDER — IBUPROFEN 600 MG PO TABS
600.0000 mg | ORAL_TABLET | Freq: Four times a day (QID) | ORAL | 0 refills | Status: DC
Start: 2020-07-30 — End: 2021-06-11

## 2020-07-30 MED ORDER — CYCLOBENZAPRINE HCL 7.5 MG PO TABS: 7.5000 mg | ORAL_TABLET | Freq: Three times a day (TID) | ORAL | 0 refills | Status: DC | PRN

## 2020-07-30 MED ORDER — COCONUT OIL OIL: 1.0000 "application " | TOPICAL_OIL | 0 refills | Status: DC | PRN

## 2020-07-30 MED ORDER — FLUOXETINE HCL 20 MG PO CAPS: 20.0000 mg | ORAL_CAPSULE | Freq: Every day | ORAL | 3 refills | Status: DC

## 2020-07-30 MED ORDER — BENZOCAINE-MENTHOL 20-0.5 % EX AERO: 1.0000 "application " | INHALATION_SPRAY | CUTANEOUS | Status: DC | PRN

## 2020-07-30 NOTE — Discharge Summary (Addendum)
OB Discharge Summary  Patient Name: Cheryl Galvan DOB: 08/03/1991 MRN: [id number]  Date of admission: 07/26/2020 Delivering provider: LAW, CASSANDRA A   Admitting diagnosis: Decreased fetal movement [O36.8190] Encounter for induction of labor [Z34.90] Intrauterine pregnancy: [redacted]w[redacted]d     Secondary diagnosis: Patient Active Problem List   Diagnosis Date Noted  . Status post placement of ureteral stent 07/29/2020  . Perineal laceration with delivery, first degree 07/29/2020  . SVD (spontaneous vaginal delivery) 8/23 07/28/2020  . Postpartum care following vaginal delivery 8/23 07/28/2020  . Encounter for induction of labor 07/27/2020  . Flank pain in pregnant patient 06/28/2020  . GDM, class A1 05/26/2018  . 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 Moderate persistent asthma 08/15/2007   Additional problems:none   Date of discharge: 07/30/2020   Discharge diagnosis: Principal Problem:   Postpartum care following vaginal delivery 8/23 Active Problems:   Encounter for induction of labor   SVD (spontaneous vaginal delivery) 8/23   Status post placement of ureteral stent   Perineal laceration with delivery, first degree                                                              Post partum procedures:CT scan for urinary stent placement  Augmentation: AROM and Pitocin Pain control: Epidural  Laceration:1st degree  Episiotomy:None  Complications: None  Hospital course:  Induction of Labor With Vaginal Delivery   29 y.o. yo G2P2002 at [redacted]w[redacted]d was admitted to the hospital 07/26/2020 for induction of labor.  Indication for induction: BPP 6/10.  Patient had an uncomplicated labor course as follows: Membrane Rupture Time/Date: 9:07 PM ,07/27/2020   Delivery Method:Vaginal, Spontaneous  Episiotomy: None  Lacerations:  1st degree  Details of delivery can be found in separate delivery note.  Patient had a routine postpartum course. Patient is discharged home 07/30/20.  Newborn  Data: Birth date:07/28/2020  Birth time:4:24 AM  Gender:Female  Living status:Living  Apgars:5 ,7  [id number] g   Physical exam  Vitals:   07/28/20 2125 07/29/20 0505 07/29/20 1915 07/30/20 0530  BP: 110/79 108/76 112/85 101/72  Pulse: 88 (!) 103 87 87  Resp: 16 18 16 16   Temp: 98.4 F (36.9 C) 98.1 F (36.7 C) 97.9 F (36.6 C) 97.6 F (36.4 C)  TempSrc: Axillary Oral Oral Oral  SpO2: 100% 100% 100% 100%   General: alert, cooperative and no distress Lochia: appropriate Uterine Fundus: firm Incision: N/A Perineum: repair intact, no edema DVT Evaluation: No significant calf/ankle edema. Labs: Lab Results  Component Value Date   WBC 12.7 (H) 07/29/2020   HGB 10.0 (L) 07/29/2020   HCT 31.8 (L) 07/29/2020   MCV 88.8 07/29/2020   PLT 189 07/29/2020   CMP Latest Ref Rng & Units 07/16/2020  Glucose 70 - 99 mg/dL 96  BUN 6 - 20 mg/dL 8  Creatinine 09/15/2020 - 0.81 mg/dL 4.48  Sodium 1.85 - 631 mmol/L 137  Potassium 3.5 - 5.1 mmol/L 3.6  Chloride 98 - 111 mmol/L 108  CO2 22 - 32 mmol/L 19(L)  Calcium 8.9 - 10.3 mg/dL 8.9  Total Protein 6.5 - 8.1 g/dL -  Total Bilirubin 0.3 - 1.2 mg/dL -  Alkaline Phos 38 - 497 U/L -  AST 15 - 41 U/L -  ALT 0 - 44 U/L -  Edinburgh Postnatal Depression Scale Screening Tool 07/28/2020 05/28/2018 05/27/2018  I have been able to laugh and see the funny side of things. 0 1 (No Data)  I have looked forward with enjoyment to things. 0 0 -  I have blamed myself unnecessarily when things went wrong. 2 2 -  I have been anxious or worried for no good reason. 2 0 -  I have felt scared or panicky for no good reason. 1 0 -  Things have been getting on top of me. 2 1 -  I have been so unhappy that I have had difficulty sleeping. 0 0 -  I have felt sad or miserable. 1 0 -  I have been so unhappy that I have been crying. 1 0 -  The thought of harming myself has occurred to me. 0 0 -  Edinburgh Postnatal Depression Scale Total 9 4 -   Vaccines: TDaP  Declined         Flu    Declined  Discharge instruction:  per After Visit Summary,  [facility] OB booklet and  "Understanding Mother & Baby Care" hospital booklet  After Visit Meds:  Allergies as of 07/30/2020   No Known Allergies     Medication List    STOP taking these medications   traMADol 50 MG tablet Commonly known as: Ultram     TAKE these medications   acetaminophen 500 MG tablet Commonly known as: TYLENOL Take 2 tablets (1,000 mg total) by mouth every 6 (six) hours as needed. What changed: reasons to take this   benzocaine-Menthol 20-0.5 % Aero Commonly known as: DERMOPLAST Apply 1 application topically as needed for irritation (perineal discomfort).   coconut oil Oil Apply 1 application topically as needed.   cyclobenzaprine 7.5 MG tablet Commonly known as: FEXMID Take 1 tablet (7.5 mg total) by mouth 3 (three) times daily as needed for muscle spasms.   FLUoxetine 20 MG capsule Commonly known as: PROZAC Take 1 capsule (20 mg total) by mouth daily.   ibuprofen 600 MG tablet Commonly known as: ADVIL Take 1 tablet (600 mg total) by mouth every 6 (six) hours.   multivitamin-prenatal 27-0.8 MG Tabs tablet Take 1 tablet by mouth daily at 12 noon.   senna-docusate 8.6-50 MG tablet Commonly known as: Senokot-S Take 2 tablets by mouth daily. Start taking on: July 31, 2020   tamsulosin 0.4 MG Caps capsule Commonly known as: FLOMAX Take 1 capsule (0.4 mg total) by mouth daily.            Discharge Care Instructions  (From admission, onward)         Start     Ordered   07/30/20 0000  Discharge wound care:       Comments: Sitz baths 2 times /day with warm water x 1 week. May add herbals: 1 ounce dried comfrey leaf* 1 ounce calendula flowers 1 ounce lavender flowers  Supplies can be found online at [employer] sources at [employer], [employer]  1/2 ounce dried uva ursi leaves 1/2 ounce witch hazel blossoms (if you can find  them) 1/2 ounce dried sage leaf 1/2 cup sea salt Directions: Bring 2 quarts of water to a boil. Turn off heat, and place 1 ounce (approximately 1 large handful) of the above mixed herbs (not the salt) into the pot. Steep, covered, for 30 minutes.  Strain the liquid well with a fine mesh strainer, and discard the herb material. Add 2 quarts of liquid to the  tub, along with the 1/2 cup of salt. This medicinal liquid can also be made into compresses and peri-rinses.   07/30/20 1106          Diet: routine diet  Activity: Advance as tolerated. Pelvic rest for 6 weeks.   Postpartum contraception: Not Discussed  Newborn Data: Live born female  Birth Weight: 8 lb 0.9 oz (3654 g) APGAR: 5, 7  Newborn Delivery   Birth date/time: 07/28/2020 04:24:00 Delivery type: Vaginal, Spontaneous      named Kennedy Bucker Baby Feeding: Breast Disposition:home with mother after phototherapy completed   Delivery Report:  Review the Delivery Report for details.    Follow up:  Follow-up Information    Olivia Mackie, MD. Schedule an appointment as soon as possible for a visit in 6 week(s).   Specialty: Obstetrics and Gynecology Contact information: [address] [place] [address] [phone]              Patient has scheduled outpatient surgery with urology 08/08/2020   Signed: Neta Mends, CNM, MSN 07/30/2020, 8:52 AM

## 2020-07-30 NOTE — Lactation Note (Addendum)
This note was copied from a baby's chart. Lactation Consultation Note  Patient Name: Cheryl Galvan ZDGUY'Q Date: 07/30/2020  Baby Cheryl Burkey latched on arrival .  Mom reports she feels he is not breastfeeding well now.  He is post circumsicion and under bili lights. Mom concerned with lip blisters and that it is starting to get painful. Mom reports mostly painful at [place] where his upper lip hits.   Discussed with mom occasional lip blisters not always a  concern but could be. Mom breastfeeding on arrival and reports cant get his lip to flange well. LC assist and at this feeding he is not flanging well.  Sleepily feeding, after a few minutes just let go. Discussed with mom that if lip blisters and continued poor feeding came with pain should probably be addressed with pediatrician and topossibly follow up with pediatric dentist. Mom reports she was given a list with her first baby, but would like to have another one.  Discussed with mom that since he is not feeding well at this time to continue to massage/hand express and pump q3h for 15-min past breastfeedings or attempted breastfeeds.. LC assist in feeding donor milk via curved tip syringe and moms finger. LC able to get infants lip to flange better on finger with finger feeding.    Infant was a very noisy feeder at this time. Infant took 9 ml of donor milk. Praised efforts.  Mom reports this is stressful because she is having kidney surgery in 2 weeks and she needs to get him breastfeeding well and a good milk supply. Urged her to keep doing what she was doing.  Call lactation as needed.      [name] Michaelle Copas 07/30/2020, 12:33 AM

## 2020-07-30 NOTE — Lactation Note (Signed)
This note was copied from a baby's chart. Lactation Consultation Note  Patient Name: Cheryl Galvan Date: 07/30/2020 Reason for consult: Follow-up assessment (LC assessed the tightness of the skin under the midline lip ) Type of Endocrine Disorder?: Diabetes  Baby is 55 hours old and elevated Bili - double photo tx. - ABO incompatibility.  When [facility] entered the room grandmother sitting in the chair and mom getting  Ready to pump. Per mom baby recently fed and the nurse documented in the computer - breast fed 1st and the supplemented with EBM and donor milk.  LC offered to assist to place the baby back on lights so grandmother could hold him. LC recommended due to the baby's age of 34 hours old to supplement him back with 30 ml or greater if still hungry. LC discussed the benefits of increasing the volume to increase the urine and stools, therefore decreasing the Bilirubin.  Dr. Hosie Poisson into see mom and baby and the decision to change the baby to the a baby patient due to treatment of photo tx.  After the baby was examined this LC checked the tight upper lip mom had mentioned with gloved fingers and noted the upper lip to stretch well, and the  Lip tie was just above the gum line slightly. Dr. Hosie Poisson aware. Mom mentioned the [facility] yesterday had given her a handout of oral specialists for referral and Dr.  Hosie Poisson mentioned Dr.HIsaw.  Mom teary when she found out baby had to stay for photo tx.  LC observed mom pumping and noted the #27 F on the right fit well and on the  Left the #27 F was snug, LC changed to #30 and per mom more comfortable.  Mom pumped off 7 ml.  LC plan - offer the breast 1st and then supplement at least 30 ml of EBM and or  Donor milk and post pump both breast ( #27 on the right and #30 F on the left ).  LC encouraged mom to try to rest and nap.   Report and updates given to [facility] )      Maternal Data Has patient been taught Hand Expression?:  Yes  Feeding Feeding Type: Breast Milk Nipple Type: Slow - flow  LATCH Score                   Interventions Interventions: Breast feeding basics reviewed  Lactation Tools Discussed/Used Tools: Pump;Flanges Flange Size: 27;30 (# 27 for the right and #30 for the left ( increased after LC observed the mom pumping )) Breast pump type: Double-Electric Breast Pump;Manual (LC recommended prior to latching - breast massage, hand express,pre-pump hand pump)   Consult Status Consult Status: Follow-up Date: 07/31/20 Follow-up type: In-patient    Cheryl Galvan 07/30/2020, 12:24 PM

## 2020-07-31 ENCOUNTER — Ambulatory Visit: Payer: Self-pay

## 2020-07-31 NOTE — Lactation Note (Signed)
This note was copied from a baby's chart. Lactation Consultation Note  Patient Name: Cheryl Galvan [id number] Date: 07/31/2020 Reason for consult: Follow-up assessment;Early term 37-38.6wks;Infant weight loss;Hyperbilirubinemia;Other (Comment) (repeat Bili / D/C written F/U tomorrow for recheck) Type of Endocrine Disorder?: Diabetes  Baby is 22 hours old , 2nd LC visit this am - repeat Bili - 12.0.  As LC entered the room MBURN feeding baby and LC noted the upper lip  Was stretching more than yesterday.  LC assessed the oral cavity with a glove finger and noted the the skin notch under lip just above the gum line and under the anterior area of the tongue was a notch 1/4 inch inward, baby has good tongue mobility.  LC reviewed the breast shells while awake between feedings to elongate the nipple / areola complex to counteract the lip - tie. Engorgement prevention and tx reviewed.  LC recommended if she goes to a oral specialist to check the frenulum  And if tx to follow up in 5- 7 days with LC .  Mom receptive to recommendations and giving her baby practice at the breast latching.  Mom has the [facility] pamphlet with phone numbers.   Maternal Data Has patient been taught Hand Expression?: Yes  Feeding Feeding Type: Donor Breast Milk  LInterventions Interventions: Breast feeding basics reviewed  Lactation Tools Discussed/Used Tools: Shells Flange Size: 27;30 Shell Type: Inverted Breast pump type: Manual;Double-Electric Breast Pump   Consult Status Consult Status: Complete Date: 07/31/20 Follow-up type: In-patient    Matilde Sprang Clois Treanor 07/31/2020, 11:18 AM

## 2020-07-31 NOTE — Lactation Note (Signed)
This note was copied from a baby's chart. Lactation Consultation Note  Patient Name: Cheryl Galvan [id number] Date: 07/31/2020 Reason for consult: Follow-up assessment;Infant weight loss;Early term 37-38.6wks (6 % weight loss / off the photo tx as of last night per mom) Type of Endocrine Disorder?: Diabetes  Baby is 2 hours old , Baby pt.  Per mom baby last fed at 7:40 am 30 ml of donor milk and the baby's blood  Was drawn at around 8 :30 am for Bili.  LC reviewed and updated the doc flow sheets per mom.  Mom mentioned the baby is latching better , not sore, but sluggish when he is latched.  Sore nipple and engorgement prevention and tx reviewed.  LC recommended for mom to call LC with feeding cues for oral assessment and latch assessment.  Per mom will have a DEBP and hand pump at home.    Maternal Data Has patient been taught Hand Expression?: Yes  Feeding Feeding Type:  (per mom last fed at 7:40 am - 30 ml of donor milk) Nipple Type: Slow - flow  LATCH Score                   Interventions Interventions: Breast feeding basics reviewed;Hand pump;DEBP  Lactation Tools Discussed/Used Tools: Pump Flange Size: 27;30 Breast pump type: Manual;Double-Electric Breast Pump   Consult Status Consult Status: Follow-up Date: 07/31/20 Follow-up type: In-patient    Cheryl Galvan 07/31/2020, 8:57 AM

## 2020-08-06 ENCOUNTER — Other Ambulatory Visit: Payer: Self-pay

## 2020-08-06 ENCOUNTER — Other Ambulatory Visit (HOSPITAL_COMMUNITY): Payer: 59

## 2020-08-06 ENCOUNTER — Other Ambulatory Visit (HOSPITAL_COMMUNITY): Payer: Self-pay | Admitting: *Deleted

## 2020-08-06 ENCOUNTER — Encounter (HOSPITAL_BASED_OUTPATIENT_CLINIC_OR_DEPARTMENT_OTHER): Payer: Self-pay | Admitting: Urology

## 2020-08-06 NOTE — Progress Notes (Signed)
Spoke w/ via phone for pre-op interview---pt  Lab needs dos----    I stat (gent), urine poct            Lab results------none COVID test ------08-07-2020 at [facility] (pt missed 91-2021 covid test) Arrive at -------530 am 08-08-2020 NPO after MN NO Solid Food.  Clear liquids from MN until---430 am then npo Medications to take morning of surgery -----none Diabetic medication -----n/a Patient Special Instructions -----none Pre-Op special Istructions -----none Patient verbalized understanding of instructions that were given at this phone interview. Patient denies shortness of breath, chest pain, fever, cough at this phone interview.

## 2020-08-07 ENCOUNTER — Other Ambulatory Visit
Admission: RE | Admit: 2020-08-07 | Discharge: 2020-08-07 | Disposition: A | Discharge status: Home / Self Care | Payer: 59 | Source: Ambulatory Visit | Attending: Urology | Admitting: Urology

## 2020-08-07 ENCOUNTER — Encounter (HOSPITAL_COMMUNITY): Payer: Self-pay | Admitting: Obstetrics and Gynecology

## 2020-08-07 DIAGNOSIS — Z20822 Contact with and (suspected) exposure to covid-19: Secondary | ICD-10-CM | POA: Diagnosis not present

## 2020-08-07 DIAGNOSIS — Z01818 Encounter for other preprocedural examination: Secondary | ICD-10-CM | POA: Diagnosis present

## 2020-08-07 LAB — SARS CORONAVIRUS 2 (TAT 6-24 HRS): SARS Coronavirus 2: NEGATIVE

## 2020-08-07 NOTE — Anesthesia Preprocedure Evaluation (Addendum)
Anesthesia Evaluation  Patient identified by MRN, date of birth, ID band Patient awake    Reviewed: Allergy & Precautions, NPO status , Patient's Chart, lab work & pertinent test results  Airway Mallampati: II  TM Distance: >3 FB Neck ROM: Full    Dental no notable dental hx. (+) Teeth Intact, Dental Advisory Given   Pulmonary asthma ,    Pulmonary exam normal breath sounds clear to auscultation       Cardiovascular negative cardio ROS Normal cardiovascular exam Rhythm:Regular Rate:Normal     Neuro/Psych PSYCHIATRIC DISORDERS Depression negative neurological ROS     GI/Hepatic negative GI ROS, Neg liver ROS,   Endo/Other  diabetes, Gestational  Renal/GU Renal diseaseNephrolithiasis s/p stent 7/25  negative genitourinary   Musculoskeletal negative musculoskeletal ROS (+)   Abdominal   Peds  Hematology   Anesthesia Other Findings   Reproductive/Obstetrics                            Anesthesia Physical Anesthesia Plan  ASA: II  Anesthesia Plan: General   Post-op Pain Management:    Induction: Intravenous  PONV Risk Score and Plan: 4 or greater and Treatment may vary due to age or medical condition, Ondansetron and Dexamethasone  Airway Management Planned: LMA  Additional Equipment: None  Intra-op Plan:   Post-operative Plan:   Informed Consent: I have reviewed the patients History and Physical, chart, labs and discussed the procedure including the risks, benefits and alternatives for the proposed anesthesia with the patient or authorized representative who has indicated his/her understanding and acceptance.     Dental advisory given  Plan Discussed with: CRNA and Anesthesiologist  Anesthesia Plan Comments:        Anesthesia Quick Evaluation

## 2020-08-08 ENCOUNTER — Encounter (HOSPITAL_BASED_OUTPATIENT_CLINIC_OR_DEPARTMENT_OTHER)
Admission: RE | Disposition: A | Discharge status: Home / Self Care | Payer: Self-pay | Source: Home / Self Care | Attending: Urology

## 2020-08-08 ENCOUNTER — Ambulatory Visit (HOSPITAL_BASED_OUTPATIENT_CLINIC_OR_DEPARTMENT_OTHER)
Admission: RE | Admit: 2020-08-08 | Discharge: 2020-08-08 | Disposition: A | Discharge status: Home / Self Care | Payer: 59 | Attending: Urology | Admitting: Urology

## 2020-08-08 ENCOUNTER — Ambulatory Visit (HOSPITAL_BASED_OUTPATIENT_CLINIC_OR_DEPARTMENT_OTHER): Payer: 59 | Admitting: Anesthesiology

## 2020-08-08 ENCOUNTER — Encounter (HOSPITAL_BASED_OUTPATIENT_CLINIC_OR_DEPARTMENT_OTHER): Payer: Self-pay | Admitting: Urology

## 2020-08-08 DIAGNOSIS — N132 Hydronephrosis with renal and ureteral calculous obstruction: Secondary | ICD-10-CM | POA: Insufficient documentation

## 2020-08-08 DIAGNOSIS — O24419 Gestational diabetes mellitus in pregnancy, unspecified control: Secondary | ICD-10-CM | POA: Diagnosis not present

## 2020-08-08 DIAGNOSIS — O26833 Pregnancy related renal disease, third trimester: Secondary | ICD-10-CM | POA: Diagnosis not present

## 2020-08-08 DIAGNOSIS — Z87442 Personal history of urinary calculi: Secondary | ICD-10-CM | POA: Diagnosis not present

## 2020-08-08 DIAGNOSIS — N200 Calculus of kidney: Secondary | ICD-10-CM

## 2020-08-08 DIAGNOSIS — Z3A33 33 weeks gestation of pregnancy: Secondary | ICD-10-CM | POA: Diagnosis not present

## 2020-08-08 HISTORY — PX: CYSTOSCOPY/URETEROSCOPY/HOLMIUM LASER/STENT PLACEMENT: SHX6546

## 2020-08-08 HISTORY — DX: Personal history of other diseases of the nervous system and sense organs: Z86.69

## 2020-08-08 HISTORY — DX: Personal history of urinary calculi: Z87.442

## 2020-08-08 LAB — POCT I-STAT, CHEM 8
BUN: 19 mg/dL (ref 6–20)
Calcium, Ion: 1.28 mmol/L (ref 1.15–1.40)
Chloride: 105 mmol/L (ref 98–111)
Creatinine, Ser: 0.7 mg/dL (ref 0.44–1.00)
Glucose, Bld: 81 mg/dL (ref 70–99)
HCT: 47 % — ABNORMAL HIGH (ref 36.0–46.0)
Hemoglobin: 16 g/dL — ABNORMAL HIGH (ref 12.0–15.0)
Potassium: 3.7 mmol/L (ref 3.5–5.1)
Sodium: 141 mmol/L (ref 135–145)
TCO2: 24 mmol/L (ref 22–32)

## 2020-08-08 LAB — POCT PREGNANCY, URINE: Preg Test, Ur: NEGATIVE

## 2020-08-08 SURGERY — CYSTOSCOPY/URETEROSCOPY/HOLMIUM LASER/STENT PLACEMENT
Anesthesia: General | Site: Ureter | Laterality: Right

## 2020-08-08 MED ORDER — AMOXICILLIN 875 MG PO TABS: 875.0000 mg | ORAL_TABLET | Freq: Once | ORAL | 0 refills | Status: AC

## 2020-08-08 MED ORDER — BELLADONNA ALKALOIDS-OPIUM 16.2-60 MG RE SUPP
RECTAL | Status: AC
  Filled 2020-08-08: qty 1

## 2020-08-08 MED ORDER — LACTATED RINGERS IV SOLN: INTRAVENOUS | Status: DC

## 2020-08-08 MED ORDER — SUCCINYLCHOLINE CHLORIDE 200 MG/10ML IV SOSY
PREFILLED_SYRINGE | INTRAVENOUS | Status: AC
  Filled 2020-08-08: qty 10

## 2020-08-08 MED ORDER — IOHEXOL 300 MG/ML  SOLN
INTRAMUSCULAR | Status: DC | PRN
  Administered 2020-08-08: 10 mL

## 2020-08-08 MED ORDER — FENTANYL CITRATE (PF) 250 MCG/5ML IJ SOLN
INTRAMUSCULAR | Status: DC | PRN
Start: 2020-08-08 — End: 2020-08-08
  Administered 2020-08-08 (×4): 25 ug via INTRAVENOUS

## 2020-08-08 MED ORDER — FENTANYL CITRATE (PF) 100 MCG/2ML IJ SOLN
INTRAMUSCULAR | Status: AC
  Filled 2020-08-08: qty 2

## 2020-08-08 MED ORDER — PROPOFOL 10 MG/ML IV BOLUS
INTRAVENOUS | Status: DC | PRN
  Administered 2020-08-08: 190 mg via INTRAVENOUS

## 2020-08-08 MED ORDER — SODIUM CHLORIDE 0.9 % IR SOLN
Status: DC | PRN
  Administered 2020-08-08: 3000 mL

## 2020-08-08 MED ORDER — DEXAMETHASONE SODIUM PHOSPHATE 10 MG/ML IJ SOLN
INTRAMUSCULAR | Status: DC | PRN
  Administered 2020-08-08: 5 mg via INTRAVENOUS

## 2020-08-08 MED ORDER — PROPOFOL 10 MG/ML IV BOLUS
INTRAVENOUS | Status: AC
  Filled 2020-08-08: qty 20

## 2020-08-08 MED ORDER — BELLADONNA ALKALOIDS-OPIUM 16.2-60 MG RE SUPP
RECTAL | Status: DC | PRN
Start: 2020-08-08 — End: 2020-08-08
  Administered 2020-08-08: 1 via RECTAL

## 2020-08-08 MED ORDER — TRAMADOL HCL 50 MG PO TABS: 50.0000 mg | ORAL_TABLET | Freq: Four times a day (QID) | ORAL | 0 refills | Status: DC | PRN

## 2020-08-08 MED ORDER — LIDOCAINE 2% (20 MG/ML) 5 ML SYRINGE
INTRAMUSCULAR | Status: AC
  Filled 2020-08-08: qty 5

## 2020-08-08 MED ORDER — MIDAZOLAM HCL 2 MG/2ML IJ SOLN
INTRAMUSCULAR | Status: AC
  Filled 2020-08-08: qty 2

## 2020-08-08 MED ORDER — OXYCODONE HCL 5 MG PO TABS: 5.0000 mg | ORAL_TABLET | Freq: Once | ORAL | Status: DC | PRN

## 2020-08-08 MED ORDER — CEFAZOLIN SODIUM-DEXTROSE 2-4 GM/100ML-% IV SOLN
2.0000 g | INTRAVENOUS | Status: AC
  Administered 2020-08-08: 2 g via INTRAVENOUS

## 2020-08-08 MED ORDER — KETOROLAC TROMETHAMINE 30 MG/ML IJ SOLN: 30.0000 mg | Freq: Once | INTRAMUSCULAR | Status: DC | PRN

## 2020-08-08 MED ORDER — ONDANSETRON HCL 4 MG/2ML IJ SOLN
INTRAMUSCULAR | Status: AC
  Filled 2020-08-08: qty 2

## 2020-08-08 MED ORDER — GENTAMICIN SULFATE 40 MG/ML IJ SOLN
320.0000 mg | INTRAVENOUS | Status: AC
  Administered 2020-08-08: 320 mg via INTRAVENOUS
  Filled 2020-08-08: qty 8

## 2020-08-08 MED ORDER — HYDROMORPHONE HCL 1 MG/ML IJ SOLN
0.2500 mg | INTRAMUSCULAR | Status: DC | PRN
  Administered 2020-08-08: 0.25 mg via INTRAVENOUS

## 2020-08-08 MED ORDER — OXYCODONE HCL 5 MG/5ML PO SOLN: 5.0000 mg | Freq: Once | ORAL | Status: DC | PRN

## 2020-08-08 MED ORDER — LIDOCAINE HCL URETHRAL/MUCOSAL 2 % EX GEL
CUTANEOUS | Status: DC | PRN
  Administered 2020-08-08: 1 via URETHRAL

## 2020-08-08 MED ORDER — LIDOCAINE 2% (20 MG/ML) 5 ML SYRINGE
INTRAMUSCULAR | Status: DC | PRN
  Administered 2020-08-08: 100 mg via INTRAVENOUS

## 2020-08-08 MED ORDER — HYDROMORPHONE HCL 1 MG/ML IJ SOLN
INTRAMUSCULAR | Status: AC
  Filled 2020-08-08: qty 1

## 2020-08-08 MED ORDER — ONDANSETRON HCL 4 MG/2ML IJ SOLN: 4.0000 mg | Freq: Once | INTRAMUSCULAR | Status: DC | PRN

## 2020-08-08 MED ORDER — DEXAMETHASONE SODIUM PHOSPHATE 10 MG/ML IJ SOLN
INTRAMUSCULAR | Status: AC
  Filled 2020-08-08: qty 1

## 2020-08-08 MED ORDER — ONDANSETRON HCL 4 MG/2ML IJ SOLN
INTRAMUSCULAR | Status: DC | PRN
  Administered 2020-08-08: 4 mg via INTRAVENOUS

## 2020-08-08 MED ORDER — CEFAZOLIN SODIUM-DEXTROSE 2-4 GM/100ML-% IV SOLN
INTRAVENOUS | Status: AC
  Filled 2020-08-08: qty 100

## 2020-08-08 SURGICAL SUPPLY — 24 items
BAG DRAIN URO-CYSTO SKYTR STRL (DRAIN) ×2 IMPLANT
BAG DRN UROCATH (DRAIN) ×1
BASKET LASER NITINOL 1.9FR (BASKET) IMPLANT
BASKET STONE 1.7 NGAGE (UROLOGICAL SUPPLIES) ×1 IMPLANT
BSKT STON RTRVL 120 1.9FR (BASKET)
CATH URET 5FR 28IN OPEN ENDED (CATHETERS) ×2 IMPLANT
CATH URET DUAL LUMEN 6-10FR 50 (CATHETERS) IMPLANT
CLOTH BEACON ORANGE TIMEOUT ST (SAFETY) ×2 IMPLANT
CNTNR URN SCR LID CUP LEK RST (MISCELLANEOUS) IMPLANT
CONT SPEC 4OZ STRL OR WHT (MISCELLANEOUS) ×2
EXTRACTOR STONE 1.7FRX115CM (UROLOGICAL SUPPLIES) IMPLANT
FIBER LASER TRAC TIP (UROLOGICAL SUPPLIES) IMPLANT
GLOVE BIO SURGEON STRL SZ7.5 (GLOVE) ×2 IMPLANT
GOWN STRL REUS W/TWL XL LVL3 (GOWN DISPOSABLE) ×2 IMPLANT
GUIDEWIRE STR DUAL SENSOR (WIRE) ×2 IMPLANT
IV NS IRRIG 3000ML ARTHROMATIC (IV SOLUTION) ×4 IMPLANT
KIT TURNOVER CYSTO (KITS) ×2 IMPLANT
MANIFOLD NEPTUNE II (INSTRUMENTS) ×2 IMPLANT
NS IRRIG 500ML POUR BTL (IV SOLUTION) ×2 IMPLANT
PACK CYSTO (CUSTOM PROCEDURE TRAY) ×2 IMPLANT
SHEATH URETERAL 12FRX35CM (MISCELLANEOUS) ×1 IMPLANT
STENT URET 6FRX24 CONTOUR (STENTS) ×1 IMPLANT
TUBE CONNECTING 12X1/4 (SUCTIONS) IMPLANT
TUBING UROLOGY SET (TUBING) ×2 IMPLANT

## 2020-08-08 NOTE — Interval H&P Note (Signed)
History and Physical Interval Note: The patient has subsequently delivered and had a CT scan showing a right distal ureteral stone and small stone in the kidney.  Plan to remove these today.   08/08/2020 7:28 AM  Cheryl Galvan  has presented today for surgery, with the diagnosis of RIGHT URETERAL STONE.  The various methods of treatment have been discussed with the patient and family. After consideration of risks, benefits and other options for treatment, the patient has consented to  Procedure(s): RIGHT URETEROSCOPY/HOLMIUM LASER/STENT EXCHANGE (Right) as a surgical intervention.  The patient's history has been reviewed, patient examined, no change in status, stable for surgery.  I have reviewed the patient's chart and labs.  Questions were answered to the patient's satisfaction.     Crist Fat

## 2020-08-08 NOTE — Anesthesia Postprocedure Evaluation (Signed)
Anesthesia Post Note  Patient: Animal nutritionist  Procedure(s) Performed: RIGHT URETEROSCOPY//STENT EXCHANGE/ (Right Ureter)     Patient location during evaluation: PACU Anesthesia Type: General Level of consciousness: awake and alert Pain management: pain level controlled Vital Signs Assessment: post-procedure vital signs reviewed and stable Respiratory status: spontaneous breathing, nonlabored ventilation, respiratory function stable and patient connected to nasal cannula oxygen Cardiovascular status: blood pressure returned to baseline and stable Postop Assessment: no apparent nausea or vomiting Anesthetic complications: no   No complications documented.  Last Vitals:  Vitals:   08/08/20 0915 08/08/20 0920  BP: 102/73   Pulse: 60 65  Resp: 17 15  Temp:    SpO2: 100% 98%    Last Pain:  Vitals:   08/08/20 0920  TempSrc:   PainSc: 2                  Trevor Iha

## 2020-08-08 NOTE — Anesthesia Procedure Notes (Signed)
Procedure Name: LMA Insertion Date/Time: 08/08/2020 7:49 AM Performed by: Lucinda Dell, CRNA Pre-anesthesia Checklist: Patient identified, Emergency Drugs available, Suction available and Patient being monitored Patient Re-evaluated:Patient Re-evaluated prior to induction Oxygen Delivery Method: Circle system utilized Preoxygenation: Pre-oxygenation with 100% oxygen Induction Type: IV induction Ventilation: Mask ventilation without difficulty LMA: LMA with gastric port inserted LMA Size: 4.0 Tube type: Oral Number of attempts: 1 Placement Confirmation: positive ETCO2 and breath sounds checked- equal and bilateral Tube secured with: Tape Dental Injury: Teeth and Oropharynx as per pre-operative assessment

## 2020-08-08 NOTE — Discharge Instructions (Signed)
DISCHARGE INSTRUCTIONS FOR KIDNEY STONE/URETERAL STENT   MEDICATIONS:  1. Resume all your other meds from home.  2. Take amoxicillin one hour prior to stent removal.  ACTIVITY:  1. No strenuous activity x 1week  2. No driving while on narcotic pain medications  3. Drink plenty of water  4. Continue to walk at home - you can still get blood clots when you are at home, so keep active, but don't over do it.  5. May return to work/school tomorrow or when you feel ready   BATHING:  1. You can shower and we recommend daily showers  2. You have a string coming from your urethra: The stent string is attached to your ureteral stent. Do not pull on this.   SIGNS/SYMPTOMS TO CALL:  Please call us if you have a fever greater than 101.5, uncontrolled nausea/vomiting, uncontrolled pain, dizziness, unable to urinate, bloody urine, chest pain, shortness of breath, leg swelling, leg pain, redness around wound, drainage from wound, or any other concerns or questions.   You can reach [place] at [phone].   FOLLOW-UP:  1. You have an appointment in 6 weeks with a ultrasound of your kidneys prior.  2. You have a string attached to your stent, you may remove it on Monday, Sept 6th. To do this, pull the strings until the stents are completely removed. You may feel an odd sensation in your back.  Post Anesthesia Home Care Instructions  Activity: Get plenty of rest for the remainder of the day. A responsible adult should stay with you for 24 hours following the procedure.  For the next 24 hours, DO NOT: -Drive a car -Advertising copywriter -Drink alcoholic beverages -Take any medication unless instructed by your physician -Make any legal decisions or sign important papers.  Meals: Start with liquid foods such as gelatin or soup. Progress to regular foods as tolerated. Avoid greasy, spicy, heavy foods. If nausea and/or vomiting occur, drink only clear liquids until the nausea and/or vomiting subsides. Call  your physician if vomiting continues.  Special Instructions/Symptoms: Your throat may feel dry or sore from the anesthesia or the breathing tube placed in your throat during surgery. If this causes discomfort, gargle with warm salt water. The discomfort should disappear within 24 hours.

## 2020-08-08 NOTE — Transfer of Care (Signed)
Immediate Anesthesia Transfer of Care Note  Patient: Cheryl Galvan  Procedure(s) Performed: RIGHT URETEROSCOPY//STENT EXCHANGE/ (Right Ureter)  Patient Location: PACU  Anesthesia Type:General  Level of Consciousness: awake, alert , oriented and patient cooperative  Airway & Oxygen Therapy: Patient Spontanous Breathing and Patient connected to face mask oxygen  Post-op Assessment: Report given to RN, Post -op Vital signs reviewed and stable and Patient moving all extremities  Post vital signs: Reviewed and stable  Last Vitals:  Vitals Value Taken Time  BP 111/76 08/08/20 0900  Temp 36.8 C 08/08/20 0900  Pulse 80 08/08/20 0903  Resp 23 08/08/20 0903  SpO2 100 % 08/08/20 0903  Vitals shown include unvalidated device data.  Last Pain:  Vitals:   08/08/20 0707  TempSrc: Oral  PainSc: 0-No pain      Patients Stated Pain Goal: 3 (08/08/20 0707)  Complications: No complications documented.

## 2020-08-08 NOTE — Op Note (Signed)
Preoperative diagnosis: left ureteral calculus  Postoperative diagnosis: left ureteral calculus  Procedure:  1. Cystoscopy 2. left ureteroscopy and stone removal 3. left 63F x 24cm ureteral stent exchange  4. left retrograde pyelography with interpretation  Surgeon: Crist Fat, MD  Anesthesia: General  Complications: None  Intraoperative findings: left retrograde pyelography demonstrated a filling defect within the left ureter (distal stone) consistent with the patient's known calculus without other abnormalities.  EBL: Minimal  Specimens: 1. left ureteral calculus  Disposition of specimens: [facility] for stone analysis  Indication: Cheryl Galvan is a 29 y.o.   patient with a left ureteral stone and associated left symptoms.  The patient had a stent placed when she was [redacted] weeks pregnant for what we assumed to be an obstructing stone.  After delivery a CT scan was performed which demonstrated a large distal ureteral stone.   After reviewing the management options for treatment, the patient elected to proceed with the above surgical procedure(s). We have discussed the potential benefits and risks of the procedure, side effects of the proposed treatment, the likelihood of the patient achieving the goals of the procedure, and any potential problems that might occur during the procedure or recuperation. Informed consent has been obtained.   Description of procedure:  The patient was taken to the operating room and general anesthesia was induced.  The patient was placed in the dorsal lithotomy position, prepped and draped in the usual sterile fashion, and preoperative antibiotics were administered. A preoperative time-out was performed.   Cystourethroscopy was performed.  The patient's urethra was examined and was normal. The bladder was then systematically examined in its entirety. There was no evidence for any bladder tumors, stones, or other mucosal  pathology.    The stent was grasped at the right ureteral orifice and brought to the meatus of the urethra.  I then attempted to wire the stent unsuccessfully.  I then reinserted the cystoscope and passed the wire alongside the stent and advanced up to the renal pelvis under fluoroscopic guidance.  I then slowly tugged on the stent and was able to remove it atraumatically.  I then looked back into the patient's bladder and the stone had been removed simultaneously with the stent requiring no laser fragmentation or basketing.  I then exchanged the wire for a 5 [place] open-ended ureteral catheter and performed a retrograde pyelogram with the above findings.  I then advanced the semirigid scope up to the renal pelvis with no stones or any additional findings.  I then slowly backed out out and exchanged for the flexible ureteroscope.  I advanced this up into the patient's renal pelvis and looked around in the kidney finding numerous papillary tip calcifications.  At this point I opted to exchange the wire for ureteral access sheath and then systematically removed all the papillary tip calcifications.  I then exchanged the wire for the the sheath and slowly backed out the scope noting no significant ureteral trauma.   The wire was then backloaded through the cystoscope and a ureteral stent was advance over the wire using Seldinger technique.  The stent was positioned appropriately under fluoroscopic and cystoscopic guidance.  The wire was then removed with an adequate stent curl noted in the renal pelvis as well as in the bladder.  The bladder was then emptied and the procedure ended.  The patient appeared to tolerate the procedure well and without complications.  The patient was able to be awakened and transferred to the recovery unit  in satisfactory condition.   Disposition: The tether of the stent was left on and secured to the ventral aspect of the patient's  tucked inside the patient's vagina.   Instructions for removing the stent have been provided to the patient. The patient has been scheduled for followup in 6 weeks with a renal ultrasound.

## 2020-08-08 NOTE — H&P (Signed)
I have been asked to see the patient by Dr. Brennan Bailey, for evaluation and management of right renal colic/hydronephrosis.  History of present illness: 29 year old [redacted] weeks pregnant female who presented to the hospital today with acute onset right-sided flank pain and associated nausea and vomiting.  The patient denied any fevers.  She denies any gross hematuria.  The pain began this morning, and has been unrelenting.  It has been managed with morphine injections IV.  The patient underwent a renal ultrasound demonstrating right hydroureteronephrosis with a nonobstructing 9 mm stone in the right lower pole.  The left side was normal.  The patient's urine analysis had microscopic hematuria but no evidence of infection.  The patient states she has passed numerous stones, the most recent one was during her first trimester of this pregnancy.  She has never seen a urologist.  She is never required any procedures to help passed stone.  The patient's pregnancy has been largely unremarkable other than the stone.  She has been recently diagnosed with gestational diabetes.  During the patient's first pregnancy she had placenta abruption.  Review of systems: A 12 point comprehensive review of systems was obtained and is negative unless otherwise stated in the history of present illness.      Patient Active Problem List   Diagnosis Date Noted  . Flank pain in pregnant patient 06/28/2020  . Postpartum hypertension 06/01/2018  . Postpartum care following vaginal delivery (6/22) 05/29/2018  . Maternal anemia, with delivery 05/28/2018  . SVD 05/27/2018 05/27/2018  . Second-degree perineal laceration, with delivery 05/27/2018  . GDM, class A1 05/26/2018  . Gestational hypertension 05/26/2018  . Encounter for planned induction of labor 05/25/2018  . Abnormal glucose tolerance test (GTT) during pregnancy, antepartum 03/24/2018  . Seasonal and perennial allergic rhinitis 08/31/2016  . GERD  (gastroesophageal reflux disease) 10/18/2013  . IBS (irritable bowel syndrome) 10/18/2013  . Migraine with aura 10/03/2013  . 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 Moderate persistent asthma 08/15/2007    No current facility-administered medications on file prior to encounter.         Current Outpatient Medications on File Prior to Encounter  Medication Sig Dispense Refill  . acetaminophen (TYLENOL) 500 MG tablet Take 1,000 mg by mouth every 6 (six) hours as needed for moderate pain.    . Prenatal Vit-Fe Fumarate-FA (MULTIVITAMIN-PRENATAL) 27-0.8 MG TABS tablet Take 1 tablet by mouth daily at 12 noon.    . busPIRone (BUSPAR) 10 MG tablet Take 1 tablet (10 mg total) by mouth 2 (two) times daily. (Patient not taking: Reported on 06/28/2020) 60 tablet 3  . ferrous sulfate 325 (65 FE) MG EC tablet Take 325 mg by mouth 3 (three) times daily with meals. (Patient not taking: Reported on 06/28/2020)    . ondansetron (ZOFRAN) 4 MG tablet Take 4 mg by mouth every 8 (eight) hours as needed for nausea or vomiting. (Patient not taking: Reported on 06/28/2020)    . ondansetron (ZOFRAN) 4 MG tablet Take 1 tablet (4 mg total) by mouth every 8 (eight) hours as needed for nausea or vomiting. (Patient not taking: Reported on 06/28/2020) 20 tablet 0  . pantoprazole (PROTONIX) 40 MG tablet Take 1 tablet (40 mg total) by mouth daily. (Patient not taking: Reported on 06/28/2020) 30 tablet 3  . PARoxetine (PAXIL) 10 MG tablet Take 1 tablet (10 mg total) by mouth daily. (Patient not taking: Reported on 06/28/2020) 30 tablet 3        Past Medical  History:  Diagnosis Date  . Acne   . Allergy    sesonal allergy   . Amblyopia   . Anxiety 2009  . Asthma     no recent difficulties   . Depression    meds years ago  . Kidney stone   . Migraine    migraines - on it years ago         Past Surgical History:  Procedure Laterality Date  . WISDOM TOOTH EXTRACTION      Social  History        Tobacco Use  . Smoking status: Never Smoker  . Smokeless tobacco: Never Used  Substance Use Topics  . Alcohol use: Not Currently    Comment: occasionally; 1 drink weekly  . Drug use: No         Family History  Problem Relation Age of Onset  . Hypertension Mother   . Allergic rhinitis Mother   . Anxiety disorder Mother   . Depression Mother   . Alcohol abuse Father   . Drug abuse Father   . Allergic rhinitis Sister   . Cancer Maternal Grandmother        colon  . Stroke Maternal Grandmother   . Diabetes Maternal Grandmother   . Hypertension Maternal Grandmother   . Cancer Maternal Grandfather        lung cancer  . Stroke Maternal Grandfather   . Cancer Paternal Grandfather        prostate  . Heart disease Other   . Angioedema Neg Hx   . Eczema Neg Hx   . Immunodeficiency Neg Hx   . Urticaria Neg Hx     PE:      Vitals:   06/28/20 1107 06/28/20 1122 06/28/20 1450  BP: (!) 122/61  (!) 101/62  Pulse: 89  91  Temp: 97.7 F (36.5 C)  98 F (36.7 C)  TempSrc: Oral  Oral  SpO2: 100% 100% 99%   Patient appears to be in moderate distress patient is alert and oriented x3 Atraumatic normocephalic head No cervical or supraclavicular lymphadenopathy appreciated No increased work of breathing, no audible wheezes/rhonchi Regular sinus rhythm/rate Abdomen is gravid, right CVA tenderness  lower extremities are symmetric without appreciable edema Grossly neurologically intact No identifiable skin lesions  Recent Labs (last 2 labs)   No results for input(s): WBC, HGB, HCT in the last 72 hours.   Recent Labs (last 2 labs)   No results for input(s): NA, K, CL, CO2, GLUCOSE, BUN, CREATININE, CALCIUM in the last 72 hours.   Recent Labs (last 2 labs)   No results for input(s): LABPT, INR in the last 72 hours.   Recent Labs (last 2 labs)   No results for input(s): LABURIN in the last 72 hours.   >         Results for  orders placed or performed during the hospital encounter of 06/01/18  Culture, OB Urine     Status: None   Collection Time: 06/01/18  4:46 PM   Specimen: Urine, Random  Result Value Ref Range Status   Specimen Description   Final    URINE, RANDOM Performed at [facility], [address], [place], [place] [address]    Special Requests   Final    NONE Performed at [facility], [address] [place], [place] [address]    Culture   Final    NO GROWTH NO GROUP B STREP (S.AGALACTIAE) ISOLATED Performed at [facility], [address] [place], [place] [address]    Report Status 06/02/2018 FINAL  Final      Imaging: I reviewed the patient's renal ultrasound with the above findings.  Imp: The patient had acute onset right flank pain with associated nausea and vomiting, right-sided hydronephrosis, and some microscopic hematuria.  She has a history of nephrolithiasis and has a nonobstructing stone in the right lower pole.  I suspect the pain that she is having is related to an obstructing kidney stone in the ureter.  The location and size of the stone variables that we can answer with the current imaging.  Recommendations: I spoke with the patient and her husband in detail about the treatment options.  Our current plan is to have the patient try to pass the stone through the night and keep her in observation for pain medication as needed.  We have also given her dose of Flomax.  If the patient still has ongoing and persistent right-sided flank pain we will plan to proceed to the operating room tomorrow morning at 8 AM for right ureteral stent placement under general anesthesia.  We will plan to do this procedure using ultrasound guidance instead of fluoroscopy.

## 2020-08-13 ENCOUNTER — Encounter (HOSPITAL_BASED_OUTPATIENT_CLINIC_OR_DEPARTMENT_OTHER): Payer: Self-pay | Admitting: Urology

## 2021-02-13 IMAGING — CT CT ABD-PELV W/O CM
2 of 4 series · 15 of 46 positions shown, 17 images · non-contrast
Comparison: 07/08/2020 renal ultrasound.

CLINICAL DATA: Partial partum abdominal pain. Urinary stent
displacement. Vaginal delivery earlier today.

EXAM:
CT ABDOMEN AND PELVIS WITHOUT CONTRAST
TECHNIQUE: Multidetector CT imaging of the abdomen and pelvis was performed
following the standard protocol without IV contrast.

[Series 3: a/p w/o 5mm · axial · non-contrast · 0.95mm/px · z∈[+922,+1442]mm · 12 of 114 slices shown, 14 images]
[im 5/114  soft-tissue]
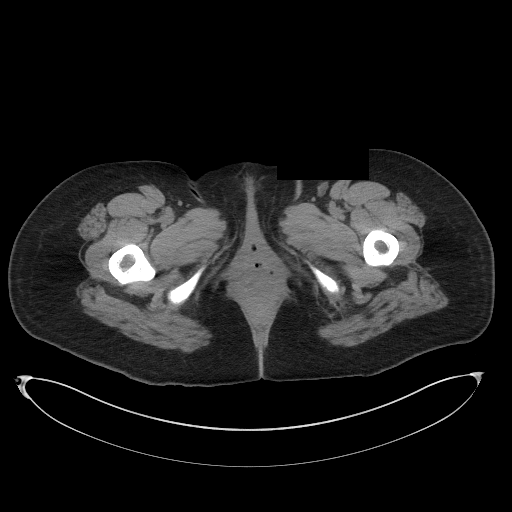
[im 5/114  bone]
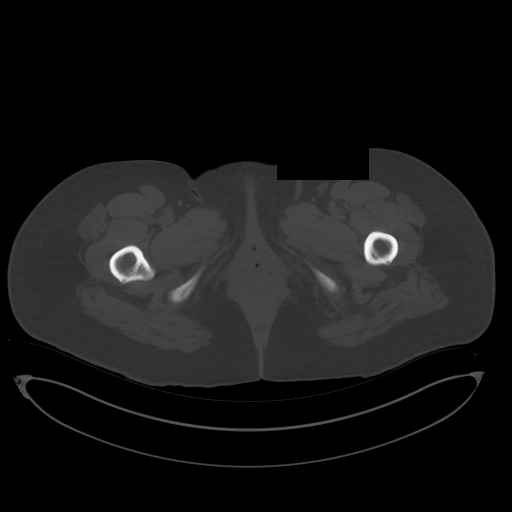
[im 15/114  soft-tissue]
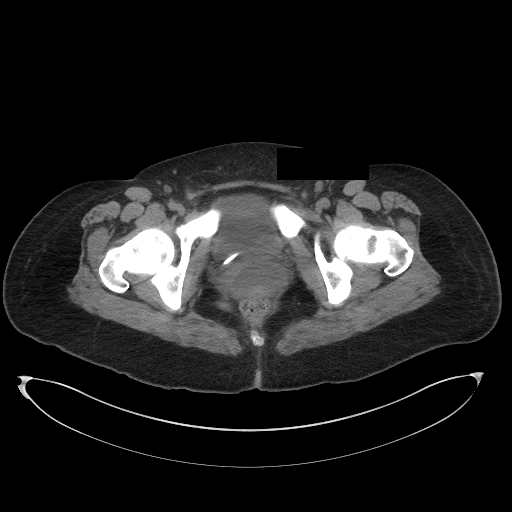
[im 25/114  soft-tissue]
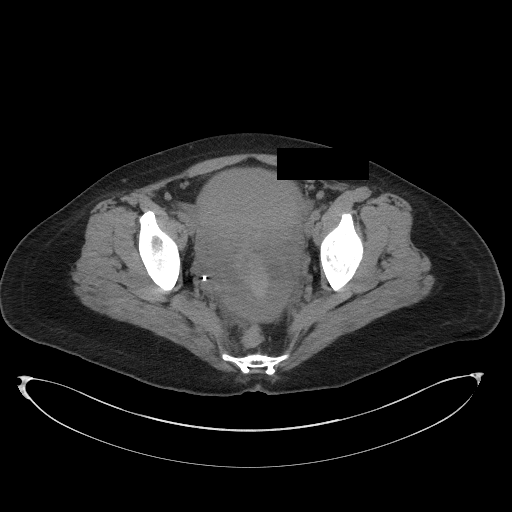
[im 35/114  soft-tissue]
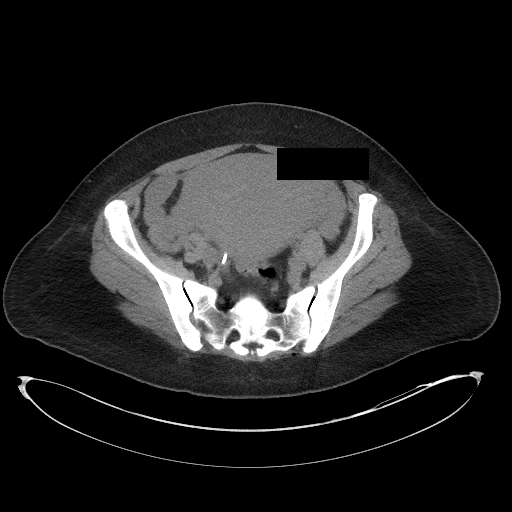
[im 45/114  soft-tissue]
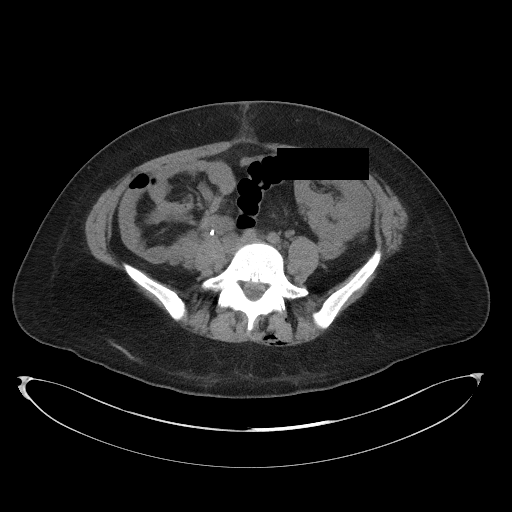
[im 55/114  soft-tissue]
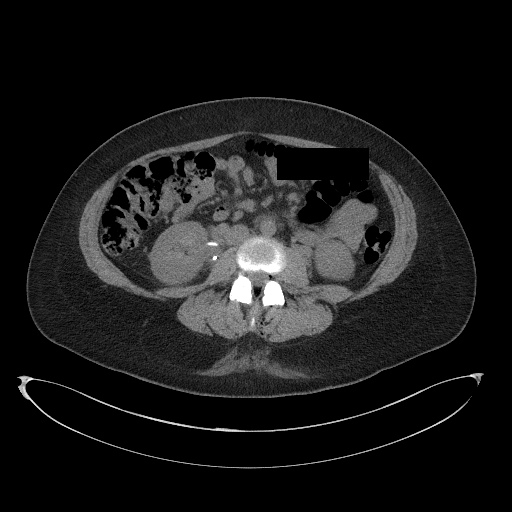
[im 59/114  soft-tissue]
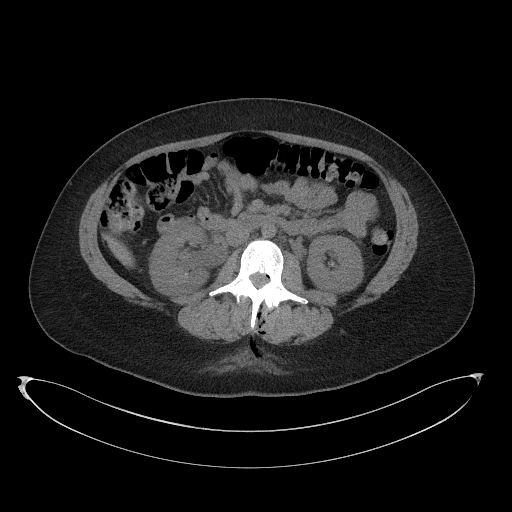
[im 69/114  soft-tissue]
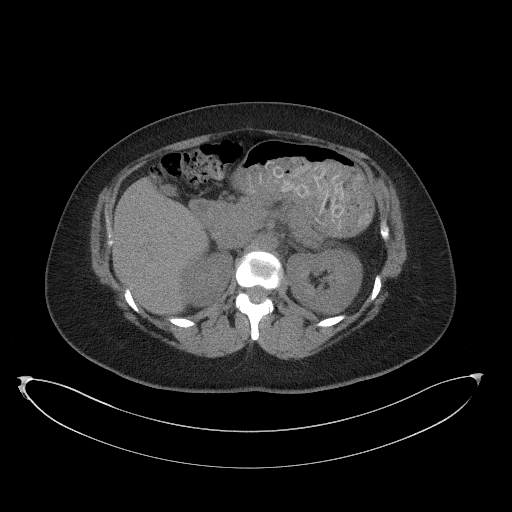
[im 79/114  soft-tissue]
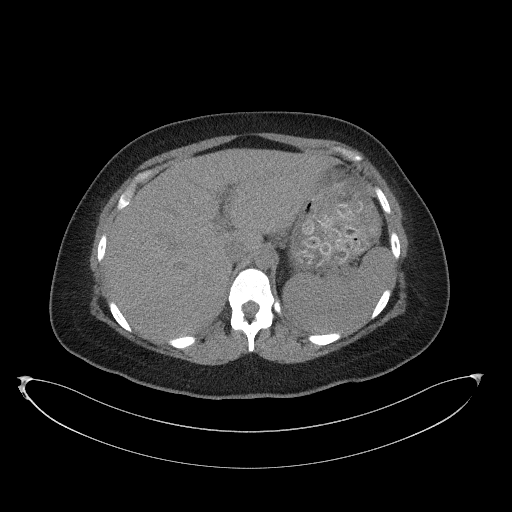
[im 79/114  bone]
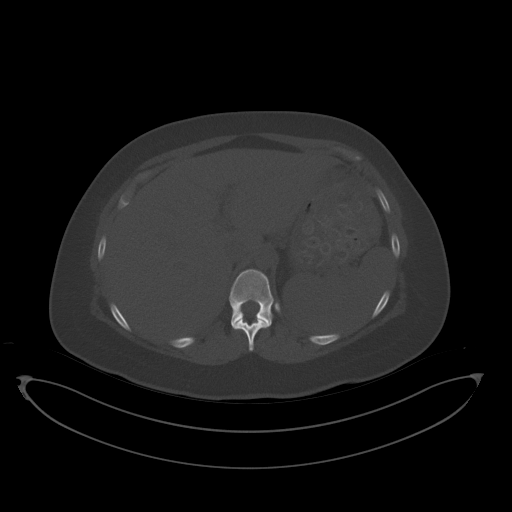
[im 89/114  soft-tissue]
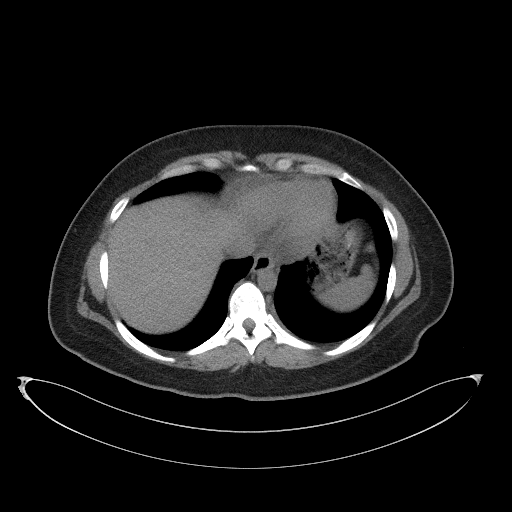
[im 99/114  soft-tissue]
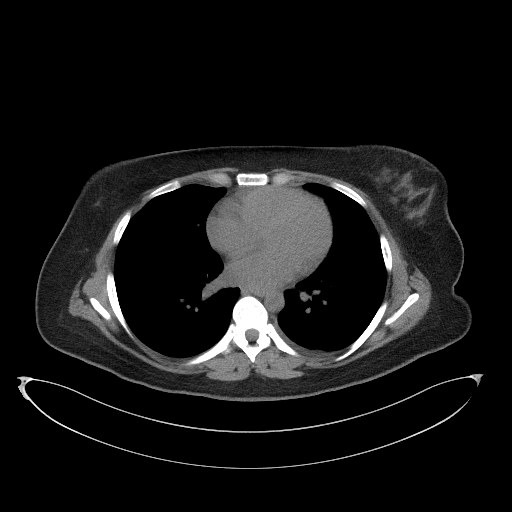
[im 109/114  soft-tissue]
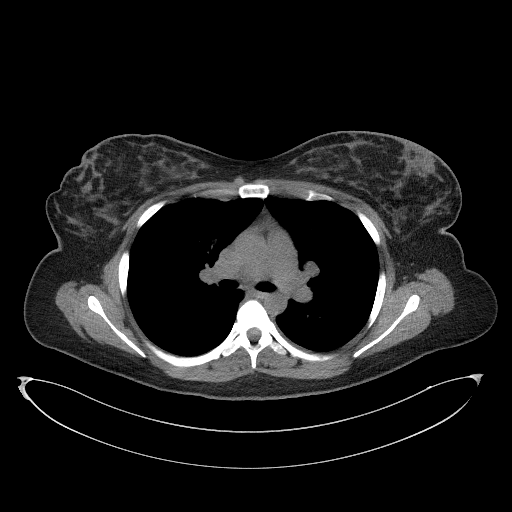

[Series 6: a/p w/o cor · coronal · non-contrast · 0.80mm/px · 3 of 151 slices shown]
[im 51/151  soft-tissue]
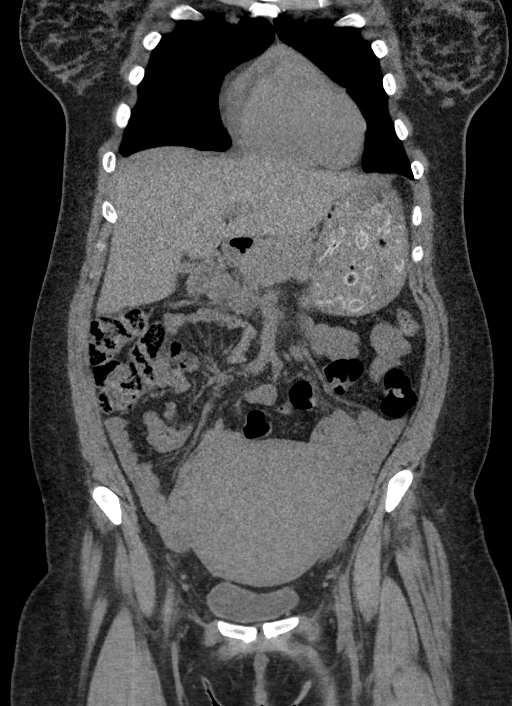
[im 67/151  soft-tissue]
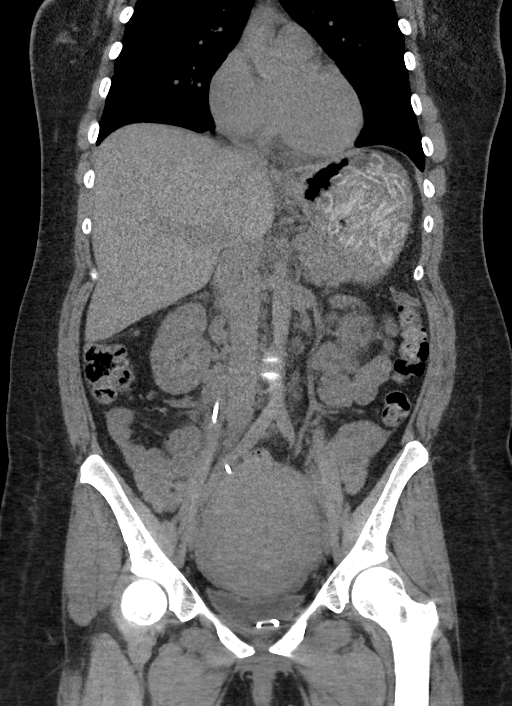
[im 84/151  soft-tissue]
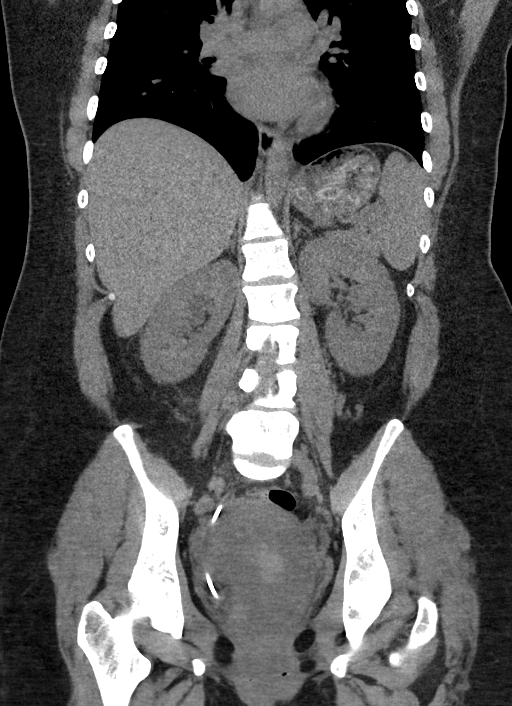

[15 of 46 positions shown; findings below may reference images not displayed]

FINDINGS: Lower chest: Clear lung bases.  Trace left pleural fluid.

Hepatobiliary: Normal noncontrast appearance of the liver,
gallbladder, biliary tract.

Pancreas: Normal, without mass or ductal dilatation.

Spleen: Normal in size, without focal abnormality.

Adrenals/Urinary Tract: Normal adrenal glands. Punctate bilateral
renal collecting system calculi. Right ureteric stent, originating
in the proximal right ureter, at the level of the lower pole right
kidney. Example coronal image 72. Mild caliectasis. Ureteric stent
terminates in the urinary bladder. No ureteric or bladder calculi.
Air within the urinary bladder is most likely iatrogenic.

Stomach/Bowel: Fluid-filled stomach. Normal colon and terminal
ileum. Appendix not visualized. No right lower quadrant inflammation
seen. Normal small bowel.

Vascular/Lymphatic: Normal caliber of the aorta and branch vessels.
No abdominopelvic adenopathy.

Reproductive: Expected postpartum appearance of the uterus, with
blood products in the endocervical canal. Adnexa not well evaluated.

Other: Trace free pelvic fluid is likely physiologic. No free
intraperitoneal air.

Musculoskeletal: Subcutaneous gas about the left paraspinous
musculature is likely due to recent epidural. Mild convex left
lumbar spine curvature.
IMPRESSION: 1. Right ureteric stent originating in the proximal right ureter.
Mild right-sided caliectasis.
2. Bilateral nephrolithiasis.
3. Expected postpartum appearance of the uterus, with blood products
in the endocervical canal.
4. Trace left pleural fluid.
5. Lack of visualization of the appendix on this nondedicated,
noncontrast exam.

## 2021-06-11 ENCOUNTER — Ambulatory Visit
Admission: RE | Admit: 2021-06-11 | Discharge: 2021-06-11 | Disposition: A | Discharge status: Home / Self Care | Payer: 59 | Source: Ambulatory Visit | Attending: Family | Admitting: Family

## 2021-06-11 ENCOUNTER — Other Ambulatory Visit: Payer: Self-pay

## 2021-06-11 ENCOUNTER — Ambulatory Visit (INDEPENDENT_AMBULATORY_CARE_PROVIDER_SITE_OTHER): Payer: 59 | Admitting: Family

## 2021-06-11 ENCOUNTER — Encounter: Payer: Self-pay | Admitting: Family

## 2021-06-11 VITALS — BP 120/82 | HR 88 | Temp 98.0°F | Ht 66.0 in | Wt 215.5 lb

## 2021-06-11 DIAGNOSIS — K6289 Other specified diseases of anus and rectum: Secondary | ICD-10-CM

## 2021-06-11 DIAGNOSIS — R1030 Lower abdominal pain, unspecified: Secondary | ICD-10-CM

## 2021-06-11 LAB — POCT URINALYSIS DIPSTICK
Bilirubin, UA: NEGATIVE
Blood, UA: NEGATIVE
Glucose, UA: NEGATIVE
Ketones, UA: NEGATIVE
Leukocytes, UA: NEGATIVE
Nitrite, UA: NEGATIVE
Protein, UA: NEGATIVE
Spec Grav, UA: >=1.03 — AB (ref 1.010–1.025)
Urobilinogen, UA: 0.2 E.U./dL
pH, UA: 6 (ref 5.0–8.0)

## 2021-06-11 MED ORDER — CYCLOBENZAPRINE HCL 5 MG PO TABS: 5.0000 mg | ORAL_TABLET | Freq: Three times a day (TID) | ORAL | 1 refills | Status: DC | PRN

## 2021-06-11 NOTE — Patient Instructions (Signed)
[address]

## 2021-06-14 ENCOUNTER — Encounter: Payer: Self-pay | Admitting: Family

## 2021-06-14 NOTE — Progress Notes (Signed)
Acute Office Visit  Subjective:    Patient ID: Cheryl Galvan, female    DOB: December 24, 1990, 30 y.o.   MRN: [id number]  Chief Complaint  Patient presents with  . Rectal Pain    Pt c/o rectal pain since Saturday off and on. No rectal bleeding. Pt states her bowels are regular going everyday. Pt says she had had increase in gas and has extreme pain with gas.    HPI Patient is in today with c/o rectal pain x 5 days that has improved. Patient reports that she has sharp abdominal pain that was very intense 8/10, starting on the right side, that moved to the left, no it is just periodically occurring as a sharp rectal pain. She reports defecation being very painful but pain relieved thereafter. She has a history of renal calculi. Reports having increased gas. Reports her bowel movements have been normal. She defecates 1-2 times per day.   Past Medical History:  Diagnosis Date  . Acne   . Allergy    sesonal allergy   . Amblyopia   . Anxiety 2009  . Asthma     no recent difficulties inhaler not needed for years  . Depression    meds years ago  . History of kidney stones   . History of migraine    none in years  . [facility]    Past Surgical History:  Procedure Laterality Date  . CYSTOSCOPY WITH STENT PLACEMENT Right 06/29/2020   Procedure: CYSTOSCOPY WITH STENT PLACEMENT;  Surgeon: Crist Fat, MD;  Location: [facility];  Service: Urology;  Laterality: Right;  . CYSTOSCOPY/URETEROSCOPY/HOLMIUM LASER/STENT PLACEMENT Right 08/08/2020   Procedure: RIGHT URETEROSCOPY//STENT EXCHANGE/;  Surgeon: Crist Fat, MD;  Location: [facility];  Service: Urology;  Laterality: Right;  . WISDOM TOOTH EXTRACTION  yrs ago    Family History  Problem Relation Age of Onset  . Hypertension Mother   . Allergic rhinitis Mother   . Anxiety disorder Mother   . Depression Mother   . Alcohol abuse Father   . Drug abuse Father   .  Allergic rhinitis Sister   . Cancer Maternal Grandmother        colon  . Stroke Maternal Grandmother   . Diabetes Maternal Grandmother   . Hypertension Maternal Grandmother   . Cancer Maternal Grandfather        lung cancer  . Stroke Maternal Grandfather   . Cancer Paternal Grandfather        prostate  . Heart disease Other   . Angioedema Neg Hx   . Eczema Neg Hx   . Immunodeficiency Neg Hx   . Urticaria Neg Hx     Social History   Socioeconomic History  . Marital status: Married    Spouse name: Not on file  . Number of children: 0  . Years of education: Assc.   . Highest education level: Not on file  Occupational History    Employer: OTHER    Comment: [employer]  Tobacco Use  . Smoking status: Never  . Smokeless tobacco: Never  Vaping Use  . Vaping Use: Never used  Substance and Sexual Activity  . Alcohol use: Not Currently  . Drug use: No  . Sexual activity: Yes    Birth control/protection: None  Other Topics Concern  . 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   No ets    Caffeine Use: 1 soda daily   Patient lives at home with her family   Social Determinants of Health   Financial Resource Strain: Not on file  Food Insecurity: Not on file  Transportation Needs: Not on file  Physical Activity: Not on file  Stress: Not on file  Social Connections: Not on file  Intimate Partner Violence: Not on file    Outpatient Medications Prior to Visit  Medication Sig Dispense Refill  . acetaminophen (TYLENOL) 500 MG tablet Take 2 tablets (1,000 mg total) by mouth every 6 (six) hours as needed. 100 tablet 2  . coconut oil OIL Apply 1 application topically as needed.  0  . FLUoxetine (PROZAC) 20 MG capsule Take 1 capsule (20 mg total) by mouth daily. (Patient taking differently: Take 20  mg by mouth at bedtime. ) 30 capsule 3  . ibuprofen (ADVIL) 600 MG tablet Take 1 tablet (600 mg total) by mouth every 6 (six) hours. 30 tablet 0  . traMADol (ULTRAM) 50 MG tablet Take 1-2 tablets (50-100 mg total) by mouth every 6 (six) hours as needed for moderate pain. 15 tablet 0   No facility-administered medications prior to visit.    No Known Allergies  Review of Systems  Constitutional: Negative.   Respiratory: Negative.    Cardiovascular: Negative.   Gastrointestinal:  Positive for abdominal pain, nausea, rectal pain and vomiting. Negative for anal bleeding, blood in stool and diarrhea.  Endocrine: Negative.   Genitourinary: Negative.  Negative for difficulty urinating.  Musculoskeletal: Negative.   Skin: Negative.   Allergic/Immunologic: Negative.   Neurological: Negative.   Hematological: Negative.   Psychiatric/Behavioral: Negative.    All other systems reviewed and are negative.     Objective:    Physical Exam Vitals and nursing note reviewed.  Constitutional:      Appearance: Normal appearance.  Eyes:     Extraocular Movements: Extraocular movements intact.     Pupils: Pupils are equal, round, and reactive to light.  Cardiovascular:     Rate and Rhythm: Normal rate and regular rhythm.     Pulses: Normal pulses.     Heart sounds: Normal heart sounds.  Pulmonary:     Effort: Pulmonary effort is normal.     Breath sounds: Normal breath sounds.  Abdominal:     General: Abdomen is flat. There is no distension.     Palpations: Abdomen is soft.     Tenderness: There is no abdominal tenderness. There is no guarding or rebound.  Musculoskeletal:        General: Normal range of motion.     Cervical back: Normal range of motion and neck supple.  Skin:    General: Skin is warm and dry.  Neurological:     General: No focal deficit present.     Mental Status: She is alert and oriented to person, place, and time.  Psychiatric:        Mood and Affect: Mood normal.         Behavior: Behavior normal.   BP 120/82 (BP Location: Left Arm, Patient Position: Sitting, Cuff Size: Large)   Pulse 88   Temp 98 F (36.7 C) (Temporal)   Ht 5\' 6"  (1.676 m)   Wt 215 lb 8 oz (97.8 kg)   LMP 05/17/2021 (Exact Date)   SpO2 100%  Breastfeeding No   BMI 34.78 kg/m  Wt Readings from Last 3 Encounters:  06/11/21 215 lb 8 oz (97.8 kg)  08/08/20 193 lb 6.4 oz (87.7 kg)  07/16/20 215 lb 3.2 oz (97.6 kg)    Health Maintenance Due  Topic Date Due  . COVID-19 Vaccine (1) Never done  . URINE MICROALBUMIN  Never done  . Hepatitis C Screening  Never done  . PAP-Cervical Cytology Screening  Never done  . PAP SMEAR-Modifier  04/05/2016  . TETANUS/TDAP  12/06/2020    There are no preventive care reminders to display for this patient.   Lab Results  Component Value Date   TSH 0.868 06/01/2018   Lab Results  Component Value Date   WBC 12.7 (H) 07/29/2020   HGB 16.0 (H) 08/08/2020   HCT 47.0 (H) 08/08/2020   MCV 88.8 07/29/2020   PLT 189 07/29/2020   Lab Results  Component Value Date   NA 141 08/08/2020   K 3.7 08/08/2020   CO2 19 (L) 07/16/2020   GLUCOSE 81 08/08/2020   BUN 19 08/08/2020   CREATININE 0.70 08/08/2020   BILITOT 0.8 10/08/2018   ALKPHOS 116 10/08/2018   AST 22 10/08/2018   ALT 16 10/08/2018   PROT 8.0 10/08/2018   ALBUMIN 4.4 10/08/2018   CALCIUM 8.9 07/16/2020   ANIONGAP 10 07/16/2020   GFR 110.01 11/18/2014   No results found for: CHOL No results found for: HDL No results found for: LDLCALC No results found for: TRIG No results found for: CHOLHDL Lab Results  Component Value Date   HGBA1C 6.1 (H) 10/03/2013       Assessment & Plan:   Problem List Items Addressed This Visit   None Visit Diagnoses     Proctalgia    -  Primary   Lower abdominal pain       Relevant Orders   DG Abd 1 View (Completed)   POCT Urinalysis Dipstick (Completed)   POCT urine pregnancy        Meds ordered this encounter  Medications  .  cyclobenzaprine (FLEXERIL) 5 MG tablet    Sig: Take 1 tablet (5 mg total) by mouth 3 (three) times daily as needed for muscle spasms.    Dispense:  30 tablet    Refill:  1   Plan: Labs obtained, xray obtained. Will notify patient pending results. It appears patient's symptoms are improving. I suspect she was having colonic spasms as a result of something she consumed. She likely has passed the substance. Call the office if symptoms worsen or persist.   Eulis Foster, FNP

## 2021-06-16 ENCOUNTER — Encounter: Payer: Self-pay | Admitting: Radiology

## 2021-06-16 ENCOUNTER — Emergency Department
Admission: EM | Admit: 2021-06-16 | Discharge: 2021-06-16 | Disposition: A | Discharge status: Home / Self Care | Payer: 59 | Attending: Emergency Medicine | Admitting: Emergency Medicine

## 2021-06-16 ENCOUNTER — Emergency Department: Payer: 59

## 2021-06-16 ENCOUNTER — Other Ambulatory Visit: Payer: Self-pay

## 2021-06-16 DIAGNOSIS — Z20822 Contact with and (suspected) exposure to covid-19: Secondary | ICD-10-CM | POA: Diagnosis not present

## 2021-06-16 DIAGNOSIS — R509 Fever, unspecified: Secondary | ICD-10-CM | POA: Diagnosis not present

## 2021-06-16 DIAGNOSIS — J45909 Unspecified asthma, uncomplicated: Secondary | ICD-10-CM | POA: Diagnosis not present

## 2021-06-16 DIAGNOSIS — K6289 Other specified diseases of anus and rectum: Secondary | ICD-10-CM

## 2021-06-16 DIAGNOSIS — R102 Pelvic and perineal pain: Secondary | ICD-10-CM | POA: Insufficient documentation

## 2021-06-16 LAB — COMPREHENSIVE METABOLIC PANEL
ALT: 12 U/L (ref 0–44)
AST: 15 U/L (ref 15–41)
Albumin: 3.7 g/dL (ref 3.5–5.0)
Alkaline Phosphatase: 82 U/L (ref 38–126)
Anion gap: 7 (ref 5–15)
BUN: 16 mg/dL (ref 6–20)
CO2: 22 mmol/L (ref 22–32)
Calcium: 8.9 mg/dL (ref 8.9–10.3)
Chloride: 109 mmol/L (ref 98–111)
Creatinine, Ser: 0.62 mg/dL (ref 0.44–1.00)
GFR, Estimated: >60 mL/min (ref >60)
Glucose, Bld: 137 mg/dL — ABNORMAL HIGH (ref 70–99)
Potassium: 3.9 mmol/L (ref 3.5–5.1)
Sodium: 138 mmol/L (ref 135–145)
Total Bilirubin: 0.7 mg/dL (ref 0.3–1.2)
Total Protein: 6.8 g/dL (ref 6.5–8.1)

## 2021-06-16 LAB — CBC WITH DIFFERENTIAL/PLATELET
Abs Immature Granulocytes: 0.03 10*3/uL (ref 0.00–0.07)
Basophils Absolute: 0 10*3/uL (ref 0.0–0.1)
Basophils Relative: 0 %
Eosinophils Absolute: 0.1 10*3/uL (ref 0.0–0.5)
Eosinophils Relative: 1 %
HCT: 36.4 % (ref 36.0–46.0)
Hemoglobin: 12.1 g/dL (ref 12.0–15.0)
Immature Granulocytes: 0 %
Lymphocytes Relative: 6 %
Lymphs Abs: 0.6 10*3/uL — ABNORMAL LOW (ref 0.7–4.0)
MCH: 29.1 pg (ref 26.0–34.0)
MCHC: 33.2 g/dL (ref 30.0–36.0)
MCV: 87.5 fL (ref 80.0–100.0)
Monocytes Absolute: 0.7 10*3/uL (ref 0.1–1.0)
Monocytes Relative: 7 %
Neutro Abs: 7.9 10*3/uL — ABNORMAL HIGH (ref 1.7–7.7)
Neutrophils Relative %: 86 %
Platelets: 200 10*3/uL (ref 150–400)
RBC: 4.16 MIL/uL (ref 3.87–5.11)
RDW: 13.6 % (ref 11.5–15.5)
WBC: 9.2 10*3/uL (ref 4.0–10.5)
nRBC: 0 % (ref 0.0–0.2)

## 2021-06-16 LAB — URINALYSIS, COMPLETE (UACMP) WITH MICROSCOPIC
Bacteria, UA: NONE SEEN
Bilirubin Urine: NEGATIVE
Glucose, UA: NEGATIVE mg/dL
Ketones, ur: NEGATIVE mg/dL
Leukocytes,Ua: NEGATIVE
Nitrite: NEGATIVE
Protein, ur: NEGATIVE mg/dL
Specific Gravity, Urine: 1.014 (ref 1.005–1.030)
pH: 5 (ref 5.0–8.0)

## 2021-06-16 LAB — LACTIC ACID, PLASMA: Lactic Acid, Venous: 1.7 mmol/L (ref 0.5–1.9)

## 2021-06-16 LAB — RESP PANEL BY RT-PCR (FLU A&B, COVID) ARPGX2
Influenza A by PCR: NEGATIVE
Influenza B by PCR: NEGATIVE
SARS Coronavirus 2 by RT PCR: NEGATIVE

## 2021-06-16 LAB — POCT URINE PREGNANCY: Preg Test, Ur: NEGATIVE

## 2021-06-16 LAB — POC URINE PREG, ED: Preg Test, Ur: NEGATIVE

## 2021-06-16 MED ORDER — MESALAMINE 1000 MG RE SUPP
1000.0000 mg | Freq: Every day | RECTAL | 0 refills | Status: DC
Start: 2021-06-16 — End: 2022-03-29

## 2021-06-16 MED ORDER — LACTATED RINGERS IV BOLUS
1000.0000 mL | Freq: Once | INTRAVENOUS | Status: AC
  Administered 2021-06-16: 1000 mL via INTRAVENOUS

## 2021-06-16 MED ORDER — METRONIDAZOLE 500 MG PO TABS: 500.0000 mg | ORAL_TABLET | Freq: Two times a day (BID) | ORAL | 0 refills | Status: DC

## 2021-06-16 MED ORDER — DOXYCYCLINE HYCLATE 100 MG PO TABS: 100.0000 mg | ORAL_TABLET | Freq: Two times a day (BID) | ORAL | 0 refills | Status: DC

## 2021-06-16 MED ORDER — CIPROFLOXACIN HCL 500 MG PO TABS: 500.0000 mg | ORAL_TABLET | Freq: Two times a day (BID) | ORAL | 0 refills | Status: DC

## 2021-06-16 MED ORDER — MORPHINE SULFATE (PF) 4 MG/ML IV SOLN
4.0000 mg | Freq: Once | INTRAVENOUS | Status: AC
  Administered 2021-06-16: 4 mg via INTRAVENOUS
  Filled 2021-06-16: qty 1

## 2021-06-16 MED ORDER — DOCUSATE SODIUM 100 MG PO CAPS: 100.0000 mg | ORAL_CAPSULE | Freq: Two times a day (BID) | ORAL | 0 refills | Status: DC

## 2021-06-16 MED ORDER — IOHEXOL 350 MG/ML SOLN
100.0000 mL | Freq: Once | INTRAVENOUS | Status: AC | PRN
  Administered 2021-06-16: 100 mL via INTRAVENOUS

## 2021-06-16 NOTE — ED Provider Notes (Signed)
-----------------------------------------   9:07 AM on 06/16/2021 ----------------------------------------- Patient CT scans essentially negative.  No perirectal or perianal abscess.  Patient's lab work is reassuring including white blood cell count of 9.2.  Patient did have a low-grade temperature 100.3 in the emergency department.  Not entirely clear the cause of the patient's rectal pain.  Does admit to anal intercourse but states this was 3 weeks ago with her husband (longstanding partner).  We will cover with antibiotics as a precaution ciprofloxacin and Flagyl.  Discussed with the patient follow-up with GI medicine to discuss further work-up and possible colonoscopy if the patient has not improved within the next 3 to 4 days of being on antibiotics.  We will also prescribe Colace to soften the patient's stool given the rectal pain.  Remainder the patient's work-up is reassuring.   Minna Antis, MD 06/16/21 [phone]

## 2021-06-16 NOTE — ED Notes (Signed)
Pt resting comfortably at this time. NAD noted. Call bell in reach. Pt waiting for CT scan.

## 2021-06-16 NOTE — ED Notes (Signed)
Pt at CT

## 2021-06-16 NOTE — ED Notes (Signed)
D/C and new RX discussed with pt, pt verbalized understanding. NAD noted. Pt ambulatory with steady gait. VSS. S/O with pt.

## 2021-06-16 NOTE — ED Triage Notes (Signed)
Pt in with co rectal pain since last week, went to pcp but still pending results. Pt now having worsening pain and fever.

## 2021-06-16 NOTE — Discharge Instructions (Addendum)
Please call GI medicine at the number provided.  Please fill your doxycycline antibiotic and mesalamine suppository and use for the next 10 days.  Return to the emergency department for worsening fever worsening pain or any other symptom personally concerning to yourself.  Please use your prescribed Colace/stool softener until symptoms resolve.

## 2021-06-16 NOTE — ED Provider Notes (Signed)
[facility] Emergency Department Provider Note ____________________________________________   Event Date/Time   First MD Initiated Contact with Patient 06/16/21 0542     (approximate)  I have reviewed the triage vital signs and the nursing notes.  HISTORY  Chief Complaint Rectal Pain and Fever   HPI Cheryl Galvan is a 30 y.o. femalewho presents to the ED for evaluation of perianal pain and fever.   Chart review indicates history of obesity.  Patient presents to the ED, accompanied by her husband, for evaluation of about 10 days of worsening perianal pain.  She reports rectal and perianal pain that initially was intermittent, but has become constant and more severe over the past 1 day.  Further reports associated fever at home up to 102 F in the setting of his worsening pain earlier tonight.  She denies any significant stool changes such as diarrhea, hematochezia, melena and reports "normal" stools.  Denies emesis or frontal abdominal pain, chest pain, syncopal episodes, vaginal discharge, history of hemorrhoids.  She reports that she is currently on her menstrual period, with appropriate timing and flow, without additional vaginal discharge.  She does report severe perianal and rectal pain with changing tampons the past couple days.  No vaginal pain.   Past Medical History:  Diagnosis Date   Acne    Allergy    sesonal allergy    Amblyopia    Anxiety 2009   Asthma     no recent difficulties inhaler not needed for years   Depression    meds years ago   History of kidney stones    History of migraine    none in years   [facility]    Patient Active Problem List   Diagnosis Date Noted   Status post placement of ureteral stent 07/29/2020   Perineal laceration with delivery, first degree 07/29/2020   SVD (spontaneous vaginal delivery) 8/23 07/28/2020   Postpartum care following vaginal delivery 8/23  07/28/2020   Encounter for induction of labor 07/27/2020   Flank pain in pregnant patient 06/28/2020   GDM, class A1 05/26/2018   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/15/2007   Moderate persistent asthma 08/15/2007    Past Surgical History:  Procedure Laterality Date   CYSTOSCOPY WITH STENT PLACEMENT Right 06/29/2020   Procedure: CYSTOSCOPY WITH STENT PLACEMENT;  Surgeon: Crist Fat, MD;  Location: [facility];  Service: Urology;  Laterality: Right;   CYSTOSCOPY/URETEROSCOPY/HOLMIUM LASER/STENT PLACEMENT Right 08/08/2020   Procedure: RIGHT URETEROSCOPY//STENT EXCHANGE/;  Surgeon: Crist Fat, MD;  Location: [facility];  Service: Urology;  Laterality: Right;   WISDOM TOOTH EXTRACTION  yrs ago    Prior to Admission medications   Medication Sig Start Date End Date Taking? Authorizing Provider  acetaminophen (TYLENOL) 500 MG tablet Take 2 tablets (1,000 mg total) by mouth every 6 (six) hours as needed. 07/30/20 07/30/21  Neta Mends, CNM  cyclobenzaprine (FLEXERIL) 5 MG tablet Take 1 tablet (5 mg total) by mouth 3 (three) times daily as needed for muscle spasms. 06/11/21   Eulis Foster, FNP    Allergies Patient has no known allergies.  Family History  Problem Relation Age of Onset   Hypertension Mother    Allergic rhinitis Mother    Anxiety disorder Mother    Depression Mother    Alcohol abuse Father    Drug abuse Father    Allergic rhinitis Sister    Cancer Maternal Grandmother  colon   Stroke Maternal Grandmother    Diabetes Maternal Grandmother    Hypertension Maternal Grandmother    Cancer Maternal Grandfather        lung cancer   Stroke Maternal Grandfather    Cancer Paternal Grandfather        prostate   Heart disease Other    Angioedema Neg Hx    Eczema Neg Hx    Immunodeficiency Neg Hx    Urticaria Neg Hx     Social History Social History   Tobacco Use   Smoking status: Never   Smokeless tobacco: Never  Vaping Use    Vaping Use: Never used  Substance Use Topics   Alcohol use: Not Currently   Drug use: No    Review of Systems  Constitutional: Positive for fever/chills Eyes: No visual changes. ENT: No sore throat. Cardiovascular: Denies chest pain. Respiratory: Denies shortness of breath. Gastrointestinal: No abdominal pain.  No nausea, no vomiting.  No diarrhea.  No constipation. Positive for perianal and rectal pain. Genitourinary: Negative for dysuria. Musculoskeletal: Negative for back pain. Skin: Negative for rash. Neurological: Negative for headaches, focal weakness or numbness.  ____________________________________________   PHYSICAL EXAM:  VITAL SIGNS: Vitals:   06/16/21 0537 06/16/21 0624  BP: 138/85   Pulse: (!) 121   Resp: 20   Temp: 98.8 F (37.1 C) 100.3 F (37.9 C)  SpO2: 100%     Constitutional: Alert and oriented. Well appearing and in no acute distress. Eyes: Conjunctivae are normal. PERRL. EOMI. Head: Atraumatic. Nose: No congestion/rhinnorhea. Mouth/Throat: Mucous membranes are moist.  Oropharynx non-erythematous. Neck: No stridor. No cervical spine tenderness to palpation. Cardiovascular: Normal rate, regular rhythm. Grossly normal heart sounds.  Good peripheral circulation. Respiratory: Normal respiratory effort.  No retractions. Lungs CTAB. Gastrointestinal: Soft , nondistended, nontender to palpation. No CVA tenderness.  Benign frontal abdomen. Rectal: Chaperoned by Eliberto Ivory RN, normal-appearing external anatomy without hemorrhoids, masses, skin changes, fluctuance or erythema.  Lubricated DRE causes discomfort and pain, but no palpable masses.  Brown stool without blood. Musculoskeletal: No lower extremity tenderness nor edema.  No joint effusions. No signs of acute trauma. Neurologic:  Normal speech and language. No gross focal neurologic deficits are appreciated. No gait instability noted. Skin:  Skin is warm, dry and intact. No rash noted. Psychiatric: Mood  and affect are normal. Speech and behavior are normal. ____________________________________________   LABS (all labs ordered are listed, but only abnormal results are displayed)  Labs Reviewed  CBC WITH DIFFERENTIAL/PLATELET - Abnormal; Notable for the following components:      Result Value   Neutro Abs 7.9 (*)    Lymphs Abs 0.6 (*)    All other components within normal limits  COMPREHENSIVE METABOLIC PANEL - Abnormal; Notable for the following components:   Glucose, Bld 137 (*)    All other components within normal limits  POC URINE PREG, ED - Normal  RESP PANEL BY RT-PCR (FLU A&B, COVID) ARPGX2  URINALYSIS, COMPLETE (UACMP) WITH MICROSCOPIC  LACTIC ACID, PLASMA  LACTIC ACID, PLASMA   ____________________________________________  12 Lead EKG   ____________________________________________  RADIOLOGY  ED MD interpretation: CT pelvis pending at the time of signout  Official radiology report(s): No results found.  ____________________________________________   PROCEDURES and INTERVENTIONS  Procedure(s) performed (including Critical Care):  .1-3 Lead EKG Interpretation  Date/Time: 06/16/2021 7:11 AM Performed by: Delton Prairie, MD Authorized by: Delton Prairie, MD     Interpretation: abnormal     ECG rate:  112  ECG rate assessment: tachycardic     Rhythm: sinus tachycardia     Ectopy: none     Conduction: normal    Medications  morphine 4 MG/ML injection 4 mg (4 mg Intravenous Given 06/16/21 0640)  lactated ringers bolus 1,000 mL (1,000 mLs Intravenous New Bag/Given 06/16/21 0640)    ____________________________________________   MDM / ED COURSE   Largely healthy 30 year old female presents to the ED with about 10 days of worsening perianal pain, now with associated fever, concerning for the possibility of a deep space abscess.  She presents tachycardic in a sinus tach, low-grade fever of 100.3 orally after taking ibuprofen at home.  She has a benign frontal  abdomen and no evidence of external pathology on rectal examination.  Due to her fever, tachycardia and normal severe pain, certainly concerned about some sort of deep infection such as a perirectal abscess.  No lactic acidosis or renal dysfunction.  CT pelvis with IV contrast is pending at time of signout to oncoming provider.     ____________________________________________   FINAL CLINICAL IMPRESSION(S) / ED DIAGNOSES  Final diagnoses:  Perianal pain  Fever, unspecified fever cause     ED Discharge Orders     None        Kazoua Gossen   Note:  This document was prepared using Dragon voice recognition software and may include unintentional dictation errors.    Delton Prairie, MD 06/16/21 [phone]

## 2021-06-16 NOTE — ED Notes (Signed)
Pt given menstrual pad due to pt having her period.

## 2021-06-17 ENCOUNTER — Encounter: Payer: Self-pay | Admitting: Family

## 2021-06-17 ENCOUNTER — Other Ambulatory Visit: Payer: Self-pay | Admitting: *Deleted

## 2021-06-17 ENCOUNTER — Telehealth: Payer: Self-pay | Admitting: Emergency Medicine

## 2021-06-17 DIAGNOSIS — R1084 Generalized abdominal pain: Secondary | ICD-10-CM

## 2021-06-17 DIAGNOSIS — K6289 Other specified diseases of anus and rectum: Secondary | ICD-10-CM

## 2021-06-17 MED ORDER — MESALAMINE 1000 MG RE SUPP: 1000.0000 mg | Freq: Every day | RECTAL | 0 refills | Status: DC

## 2021-06-17 NOTE — Telephone Encounter (Signed)
-----------------------------------------   3:19 PM on @EDTODAY @ ----------------------------------------- Patient states she attempted to fill her mesalamine at [facility] and it was going to be over $800 to fill.  We will switch the prescription to [facility] with good Rx should be $43.59.  Patient agreeable to plan of care.

## 2021-06-22 ENCOUNTER — Telehealth: Payer: Self-pay

## 2021-06-22 NOTE — Telephone Encounter (Signed)
Patient Name: Cheryl Galvan Gender: Female DOB: 1991/04/11 Age: 30 Y 9 M 17 D Return Phone Number: [phone] (Primary) Address: City/ State/ Zip: [place] [address] Client [facility] Night - Human resources officer [facility] Night Physician Jacquiline Doe- MD Contact Type Call Who Is Calling Patient / Member / Family / Caregiver Call Type Triage / Clinical Relationship To Patient Self Return Phone Number [phone] (Primary) Chief Complaint Joint Pain Reason for Call Symptomatic / Request for Health Information Initial Comment Caller states she is on antibiotics from her recent ER visit and is now having body aches and joint pain. Translation No Nurse Assessment Nurse: Michell Heinrich, RN, Lanora Manis Date/Time (Eastern Time): 06/21/2021 3:04:24 PM Confirm and document reason for call. If symptomatic, describe symptoms. ---Caller states that she was recently started on antibiotics for infection (Doxycycline). States that now she is having body aches and joint pain. Does the patient have any new or worsening symptoms? ---Yes Will a triage be completed? ---Yes Related visit to physician within the last 2 weeks? ---Yes Does the PT have any chronic conditions? (i.e. diabetes, asthma, this includes High risk factors for pregnancy, etc.) ---No Is the patient pregnant or possibly pregnant? (Ask all females between the ages of 5-55) ---No Is this a behavioral health or substance abuse call? ---No Guidelines Guideline Title Affirmed Question Affirmed Notes Nurse Date/Time (Eastern Time) Hand and Wrist Pain Weakness (i.e., loss of strength) of newonset in hand or fingers (Exceptions: not truly weak, hand feels weak because Cantrell, RN, Lanora Manis 06/21/2021 3:07:41 PM PLEASE NOTE: All timestamps contained within this report are represented as [place] Standard Time. CONFIDENTIALTY NOTICE: This fax transmission is intended only  for the addressee. It contains information that is legally privileged, confidential or otherwise protected from use or disclosure. If you are not the intended recipient, you are strictly prohibited from reviewing, disclosing, copying using or disseminating any of this information or taking any action in reliance on or regarding this information. If you have received this fax in error, please notify us immediately by telephone so that we can arrange for its return to [place]. Phone: [phone], Toll-Free: [phone], Fax: [phone] Page: 2 of 2 Call Id: [id number] Guidelines Guideline Title Affirmed Question Affirmed Notes Nurse Date/Time Lamount Cohen Time) of pain; weakness present > 2 weeks) Disp. Time Lamount Cohen Time) Disposition Final User 06/21/2021 3:10:21 PM Go to ED Now (or PCP triage) Yes Cantrell, RN, Lanora Manis Disposition Overriden: See PCP within 24 Hours Override Reason: Specify reason. (Please document in 'advice recommended' section) Caller Disagree/Comply Comply Caller Understands Yes PreDisposition Call Doctor Care Advice Given Per Guideline GO TO ED NOW (OR PCP TRIAGE): * IF NO PCP (PRIMARY CARE PROVIDER) SECOND-LEVEL TRIAGE: You need to be seen within the next hour. Go to the ED/UCC at _____________ Hospital. Leave as soon as you can. ANOTHER ADULT SHOULD DRIVE: * It is better and safer if another adult drives instead of you. BRING MEDICINES: * Bring a list of your current medicines when you go to the Emergency Department (ER). * Bring the pill bottles too. This will help the doctor (or NP/PA) to make certain you are taking the right medicines and the right dose. CARE ADVICE given per Hand and Wrist Pain (Adult) guideline. Comments User: Patrica Duel, RN Date/Time Lamount Cohen Time): 06/21/2021 3:11:33 PM Upgraded- Caller on antibiotics for infection. Worried about possible severe side effect or infection. Referrals GO TO FACILITY UNDECIDED

## 2021-06-22 NOTE — Telephone Encounter (Signed)
Next available for office would be Thursday or Friday. [place] offices are full for today. Please call patient and advise for follow up.

## 2021-06-22 NOTE — Telephone Encounter (Signed)
Please schedule f/u visit for pt to be evaluated.

## 2021-06-22 NOTE — Telephone Encounter (Signed)
Ok to schedule pt for either day.

## 2021-06-23 ENCOUNTER — Encounter: Payer: Self-pay | Admitting: Gastroenterology

## 2021-06-23 NOTE — Telephone Encounter (Signed)
Talked to pt that she has an appt with GI today. The hospital told her to follow up there

## 2021-06-23 NOTE — Telephone Encounter (Signed)
Noted  

## 2021-07-23 ENCOUNTER — Encounter: Payer: Self-pay | Admitting: Adult Health

## 2021-07-23 ENCOUNTER — Ambulatory Visit: Payer: 59 | Admitting: Gastroenterology

## 2021-08-26 ENCOUNTER — Encounter: Payer: 59 | Admitting: Adult Health

## 2021-09-30 ENCOUNTER — Encounter: Payer: 59 | Admitting: Adult Health

## 2021-10-01 ENCOUNTER — Encounter: Payer: 59 | Admitting: Adult Health

## 2021-12-15 DIAGNOSIS — F411 Generalized anxiety disorder: Secondary | ICD-10-CM | POA: Diagnosis not present

## 2021-12-15 DIAGNOSIS — Z600 Problems of adjustment to life-cycle transitions: Secondary | ICD-10-CM | POA: Diagnosis not present

## 2021-12-15 DIAGNOSIS — Z3200 Encounter for pregnancy test, result unknown: Secondary | ICD-10-CM | POA: Diagnosis not present

## 2021-12-25 DIAGNOSIS — R7303 Prediabetes: Secondary | ICD-10-CM | POA: Diagnosis not present

## 2021-12-28 ENCOUNTER — Other Ambulatory Visit: Payer: Self-pay

## 2021-12-28 ENCOUNTER — Ambulatory Visit (INDEPENDENT_AMBULATORY_CARE_PROVIDER_SITE_OTHER): Payer: BC Managed Care – PPO | Admitting: Adult Health

## 2021-12-28 ENCOUNTER — Encounter: Payer: Self-pay | Admitting: Adult Health

## 2021-12-28 VITALS — BP 110/80 | HR 84 | Temp 97.9°F | Resp 12 | Ht 67.0 in | Wt 196.0 lb

## 2021-12-28 DIAGNOSIS — E559 Vitamin D deficiency, unspecified: Secondary | ICD-10-CM | POA: Insufficient documentation

## 2021-12-28 DIAGNOSIS — E663 Overweight: Secondary | ICD-10-CM | POA: Insufficient documentation

## 2021-12-28 DIAGNOSIS — R7303 Prediabetes: Secondary | ICD-10-CM | POA: Diagnosis not present

## 2021-12-28 DIAGNOSIS — Z87442 Personal history of urinary calculi: Secondary | ICD-10-CM | POA: Diagnosis not present

## 2021-12-28 DIAGNOSIS — R41840 Attention and concentration deficit: Secondary | ICD-10-CM | POA: Diagnosis not present

## 2021-12-28 DIAGNOSIS — H6501 Acute serous otitis media, right ear: Secondary | ICD-10-CM | POA: Insufficient documentation

## 2021-12-28 DIAGNOSIS — H66001 Acute suppurative otitis media without spontaneous rupture of ear drum, right ear: Secondary | ICD-10-CM

## 2021-12-28 DIAGNOSIS — R6889 Other general symptoms and signs: Secondary | ICD-10-CM | POA: Diagnosis not present

## 2021-12-28 DIAGNOSIS — Z6830 Body mass index (BMI) 30.0-30.9, adult: Secondary | ICD-10-CM

## 2021-12-28 LAB — COMPREHENSIVE METABOLIC PANEL
ALT: 7 U/L (ref 0–35)
AST: 9 U/L (ref 0–37)
Albumin: 4.5 g/dL (ref 3.5–5.2)
Alkaline Phosphatase: 78 U/L (ref 39–117)
BUN: 17 mg/dL (ref 6–23)
CO2: 24 mEq/L (ref 19–32)
Calcium: 9.8 mg/dL (ref 8.4–10.5)
Chloride: 103 mEq/L (ref 96–112)
Creatinine, Ser: 0.67 mg/dL (ref 0.40–1.20)
GFR: 117.37 mL/min (ref >60.00)
Glucose, Bld: 112 mg/dL — ABNORMAL HIGH (ref 70–99)
Potassium: 4.1 mEq/L (ref 3.5–5.1)
Sodium: 136 mEq/L (ref 135–145)
Total Bilirubin: 0.5 mg/dL (ref 0.2–1.2)
Total Protein: 7.3 g/dL (ref 6.0–8.3)

## 2021-12-28 LAB — CBC WITH DIFFERENTIAL/PLATELET
Basophils Absolute: 0 10*3/uL (ref 0.0–0.1)
Basophils Relative: 0.6 % (ref 0.0–3.0)
Eosinophils Absolute: 0.1 10*3/uL (ref 0.0–0.7)
Eosinophils Relative: 1 % (ref 0.0–5.0)
HCT: 41 % (ref 36.0–46.0)
Hemoglobin: 13.2 g/dL (ref 12.0–15.0)
Lymphocytes Relative: 23.8 % (ref 12.0–46.0)
Lymphs Abs: 1.5 10*3/uL (ref 0.7–4.0)
MCHC: 32.3 g/dL (ref 30.0–36.0)
MCV: 84.7 fl (ref 78.0–100.0)
Monocytes Absolute: 0.3 10*3/uL (ref 0.1–1.0)
Monocytes Relative: 5 % (ref 3.0–12.0)
Neutro Abs: 4.4 10*3/uL (ref 1.4–7.7)
Neutrophils Relative %: 69.6 % (ref 43.0–77.0)
Platelets: 315 10*3/uL (ref 150.0–400.0)
RBC: 4.84 Mil/uL (ref 3.87–5.11)
RDW: 14 % (ref 11.5–15.5)
WBC: 6.3 10*3/uL (ref 4.0–10.5)

## 2021-12-28 LAB — URINALYSIS
Bilirubin Urine: NEGATIVE
Hgb urine dipstick: NEGATIVE
Ketones, ur: NEGATIVE
Leukocytes,Ua: NEGATIVE
Nitrite: NEGATIVE
Specific Gravity, Urine: 1.02 (ref 1.000–1.030)
Total Protein, Urine: NEGATIVE
Urine Glucose: NEGATIVE
Urobilinogen, UA: 0.2 (ref 0.0–1.0)
pH: 6 (ref 5.0–8.0)

## 2021-12-28 LAB — VITAMIN D 25 HYDROXY (VIT D DEFICIENCY, FRACTURES): VITD: 38.43 ng/mL (ref 30.00–100.00)

## 2021-12-28 MED ORDER — METFORMIN HCL ER 500 MG PO TB24: 500.0000 mg | ORAL_TABLET | Freq: Every day | ORAL | 1 refills | Status: DC

## 2021-12-28 MED ORDER — AMOXICILLIN 875 MG PO TABS: 875.0000 mg | ORAL_TABLET | Freq: Two times a day (BID) | ORAL | 0 refills | Status: AC

## 2021-12-28 NOTE — Assessment & Plan Note (Signed)
Advised Vitamin D 3 at 4,000 international units once daily recheck vitamin D level in 3 months.

## 2021-12-28 NOTE — Patient Instructions (Addendum)
[place] [facility] SpecialistsCall [place]: [phone]  Prediabetes Eating Plan Prediabetes is a condition that causes blood sugar (glucose) levels to be higher than normal. This increases the risk for developing type 2 diabetes (type 2 diabetes mellitus). Working with a health care provider or nutrition specialist (dietitian) to make diet and lifestyle changes can help prevent the onset of diabetes. These changes may help you: Control your blood glucose levels. Improve your cholesterol levels. Manage your blood pressure. What are tips for following this plan? Reading food labels Read food labels to check the amount of fat, salt (sodium), and sugar in prepackaged foods. Avoid foods that have: Saturated fats. Trans fats. Added sugars. Avoid foods that have more than 300 milligrams (mg) of sodium per serving. Limit your sodium intake to less than 2,300 mg each day. Shopping Avoid buying pre-made and processed foods. Avoid buying drinks with added sugar. Cooking Cook with olive oil. Do not use butter, lard, or ghee. Bake, broil, grill, steam, or boil foods. Avoid frying. Meal planning  Work with your dietitian to create an eating plan that is right for you. This may include tracking how many calories you take in each day. Use a food diary, notebook, or mobile application to track what you eat at each meal. Consider following a Mediterranean diet. This includes: Eating several servings of fresh fruits and vegetables each day. Eating fish at least twice a week. Eating one serving each day of whole grains, beans, nuts, and seeds. Using olive oil instead of other fats. Limiting alcohol. Limiting red meat. Using nonfat or low-fat dairy products. Consider following a plant-based diet. This includes dietary choices that focus on eating mostly vegetables and fruit, grains, beans, nuts, and seeds. If you have high blood pressure, you may need to limit your sodium intake or follow a diet  such as the DASH (Dietary Approaches to Stop Hypertension) eating plan. The DASH diet aims to lower high blood pressure. Lifestyle Set weight loss goals with help from your health care team. It is recommended that most people with prediabetes lose 7% of their body weight. Exercise for at least 30 minutes 5 or more days a week. Attend a support group or seek support from a mental health counselor. Take over-the-counter and prescription medicines only as told by your health care provider. What foods are recommended? Fruits Berries. Bananas. Apples. Oranges. Grapes. Papaya. Mango. Pomegranate. Kiwi. Grapefruit. Cherries. Vegetables Lettuce. Spinach. Peas. Beets. Cauliflower. Cabbage. Broccoli. Carrots. Tomatoes. Squash. Eggplant. Herbs. Peppers. Onions. Cucumbers. Brussels sprouts. Grains Whole grains, such as whole-wheat or whole-grain breads, crackers, cereals, and pasta. Unsweetened oatmeal. Bulgur. Barley. Quinoa. Brown rice. Corn or whole-wheat flour tortillas or taco shells. Meats and other proteins Seafood. Poultry without skin. Lean cuts of pork and beef. Tofu. Eggs. Nuts. Beans. Dairy Low-fat or fat-free dairy products, such as yogurt, cottage cheese, and cheese. Beverages Water. Tea. Coffee. Sugar-free or diet soda. Seltzer water. Low-fat or nonfat milk. Milk alternatives, such as soy or almond milk. Fats and oils Olive oil. Canola oil. Sunflower oil. Grapeseed oil. Avocado. Walnuts. Sweets and desserts Sugar-free or low-fat pudding. Sugar-free or low-fat ice cream and other frozen treats. Seasonings and condiments Herbs. Sodium-free spices. Mustard. Relish. Low-salt, low-sugar ketchup. Low-salt, low-sugar barbecue sauce. Low-fat or fat-free mayonnaise. The items listed above may not be a complete list of recommended foods and beverages. Contact a dietitian for more information. What foods are not recommended? Fruits Fruits canned with syrup. Vegetables Canned vegetables. Frozen  vegetables with butter or cream  sauce. Grains Refined white flour and flour products, such as bread, pasta, snack foods, and cereals. Meats and other proteins Fatty cuts of meat. Poultry with skin. Breaded or fried meat. Processed meats. Dairy Full-fat yogurt, cheese, or milk. Beverages Sweetened drinks, such as iced tea and soda. Fats and oils Butter. Lard. Ghee. Sweets and desserts Baked goods, such as cake, cupcakes, pastries, cookies, and cheesecake. Seasonings and condiments Spice mixes with added salt. Ketchup. Barbecue sauce. Mayonnaise. The items listed above may not be a complete list of foods and beverages that are not recommended. Contact a dietitian for more information. Where to find more information American Diabetes Association: www.diabetes.org Summary You may need to make diet and lifestyle changes to help prevent the onset of diabetes. These changes can help you control blood sugar, improve cholesterol levels, and manage blood pressure. Set weight loss goals with help from your health care team. It is recommended that most people with prediabetes lose 7% of their body weight. Consider following a Mediterranean diet. This includes eating plenty of fresh fruits and vegetables, whole grains, beans, nuts, seeds, fish, and low-fat dairy, and using olive oil instead of other fats. This information is not intended to replace advice given to you by your health care provider. Make sure you discuss any questions you have with your health care provider. Document Revised: 02/21/2020 Document Reviewed: 02/21/2020 Elsevier Patient Education  [place] Maintenance, Female Adopting a healthy lifestyle and getting preventive care are important in promoting health and wellness. Ask your health care provider about: The right schedule for you to have regular tests and exams. Things you can do on your own to prevent diseases and keep yourself healthy. What should I know  about diet, weight, and exercise? Eat a healthy diet  Eat a diet that includes plenty of vegetables, fruits, low-fat dairy products, and lean protein. Do not eat a lot of foods that are high in solid fats, added sugars, or sodium. Maintain a healthy weight Body mass index (BMI) is used to identify weight problems. It estimates body fat based on height and weight. Your health care provider can help determine your BMI and help you achieve or maintain a healthy weight. Get regular exercise Get regular exercise. This is one of the most important things you can do for your health. Most adults should: Exercise for at least 150 minutes each week. The exercise should increase your heart rate and make you sweat (moderate-intensity exercise). Do strengthening exercises at least twice a week. This is in addition to the moderate-intensity exercise. Spend less time sitting. Even light physical activity can be beneficial. Watch cholesterol and blood lipids Have your blood tested for lipids and cholesterol at 31 years of age, then have this test every 5 years. Have your cholesterol levels checked more often if: Your lipid or cholesterol levels are high. You are older than 31 years of age. You are at high risk for heart disease. What should I know about cancer screening? Depending on your health history and family history, you may need to have cancer screening at various ages. This may include screening for: Breast cancer. Cervical cancer. Colorectal cancer. Skin cancer. Lung cancer. What should I know about heart disease, diabetes, and high blood pressure? Blood pressure and heart disease High blood pressure causes heart disease and increases the risk of stroke. This is more likely to develop in people who have high blood pressure readings or are overweight. Have your blood pressure checked: Every 3-5  years if you are 60-32 years of age. Every year if you are 20 years old or older. Diabetes Have  regular diabetes screenings. This checks your fasting blood sugar level. Have the screening done: Once every three years after age 27 if you are at a normal weight and have a low risk for diabetes. More often and at a younger age if you are overweight or have a high risk for diabetes. What should I know about preventing infection? Hepatitis B If you have a higher risk for hepatitis B, you should be screened for this virus. Talk with your health care provider to find out if you are at risk for hepatitis B infection. Hepatitis C Testing is recommended for: Everyone born from [birth year] through 1965. Anyone with known risk factors for hepatitis C. Sexually transmitted infections (STIs) Get screened for STIs, including gonorrhea and chlamydia, if: You are sexually active and are younger than 31 years of age. You are older than 31 years of age and your health care provider tells you that you are at risk for this type of infection. Your sexual activity has changed since you were last screened, and you are at increased risk for chlamydia or gonorrhea. Ask your health care provider if you are at risk. Ask your health care provider about whether you are at high risk for HIV. Your health care provider may recommend a prescription medicine to help prevent HIV infection. If you choose to take medicine to prevent HIV, you should first get tested for HIV. You should then be tested every 3 months for as long as you are taking the medicine. Pregnancy If you are about to stop having your period (premenopausal) and you may become pregnant, seek counseling before you get pregnant. Take 400 to 800 micrograms (mcg) of folic acid every day if you become pregnant. Ask for birth control (contraception) if you want to prevent pregnancy. Osteoporosis and menopause Osteoporosis is a disease in which the bones lose minerals and strength with aging. This can result in bone fractures. If you are 68 years old or older, or if you  are at risk for osteoporosis and fractures, ask your health care provider if you should: Be screened for bone loss. Take a calcium or vitamin D supplement to lower your risk of fractures. Be given hormone replacement therapy (HRT) to treat symptoms of menopause. Follow these instructions at home: Alcohol use Do not drink alcohol if: Your health care provider tells you not to drink. You are pregnant, may be pregnant, or are planning to become pregnant. If you drink alcohol: Limit how much you have to: 0-1 drink a day. Know how much alcohol is in your drink. In the [place] one drink equals one 12 oz bottle of beer (355 mL), one 5 oz glass of wine (148 mL), or one 1 oz glass of hard liquor (44 mL). Lifestyle Do not use any products that contain nicotine or tobacco. These products include cigarettes, chewing tobacco, and vaping devices, such as e-cigarettes. If you need help quitting, ask your health care provider. Do not use street drugs. Do not share needles. Ask your health care provider for help if you need support or information about quitting drugs. General instructions Schedule regular health, dental, and eye exams. Stay current with your vaccines. Tell your health care provider if: You often feel depressed. You have ever been abused or do not feel safe at home. Summary Adopting a healthy lifestyle and getting preventive care are important in promoting health  and wellness. Follow your health care provider's instructions about healthy diet, exercising, and getting tested or screened for diseases. Follow your health care provider's instructions on monitoring your cholesterol and blood pressure. This information is not intended to replace advice given to you by your health care provider. Make sure you discuss any questions you have with your health care provider. Document Revised: 04/13/2021 Document Reviewed: 04/13/2021 Elsevier Patient Education  2022 [place]. Metformin  Extended-Release Tablets What is this medication? METFORMIN (met FOR min) treats type 2 diabetes. It controls blood sugar (glucose) and helps your body use insulin effectively. This medication is often combined with changes to diet and exercise. This medicine may be used for other purposes; ask your health care provider or pharmacist if you have questions. COMMON BRAND NAME(S): Fortamet, Glucophage XR, Glumetza What should I tell my care team before I take this medication? They need to know if you have any of these conditions: Anemia Dehydration Heart disease If you often drink alcohol Kidney disease Liver disease Polycystic ovary syndrome Serious infection or injury Vomiting An unusual or allergic reaction to metformin, other medications, foods, dyes, or preservatives Pregnant or trying to get pregnant Breast-feeding How should I use this medication? Take this medication by mouth with a glass of water. Follow the directions on the prescription label. Take this medication with food. Take your medication at regular intervals. Do not take your medication more often than directed. Do not stop taking except on your care team's advice. Talk to your care team about the use of this medication in children. Special care may be needed. Overdosage: If you think you have taken too much of this medicine contact a poison control center or emergency room at once. NOTE: This medicine is only for you. Do not share this medicine with others. What if I miss a dose? If you miss a dose, take it as soon as you can. If it is almost time for your next dose, take only that dose. Do not take double or extra doses. What may interact with this medication? Do not take this medication with any of the following: Certain contrast medications given before X-rays, CT scans, MRI, or other procedures Dofetilide This medication may also interact with the following: Acetazolamide Alcohol Certain antivirals for HIV or  hepatitis Certain medications for blood pressure, heart disease, irregular heart beat Cimetidine Dichlorphenamide Digoxin Diuretics Estrogens, progestins, or birth control pills Glycopyrrolate Isoniazid Lamotrigine Memantine Methazolamide Metoclopramide Midodrine Niacin Phenothiazines like chlorpromazine, mesoridazine, prochlorperazine, thioridazine Phenytoin Ranolazine Steroid medications like prednisone or cortisone Stimulant medications for attention disorders, weight loss, or to stay awake Thyroid medications Topiramate Trospium Vandetanib Zonisamide This list may not describe all possible interactions. Give your health care provider a list of all the medicines, herbs, non-prescription drugs, or dietary supplements you use. Also tell them if you smoke, drink alcohol, or use illegal drugs. Some items may interact with your medicine. What should I watch for while using this medication? Visit your care team for regular checks on your progress. A test called the HbA1C (A1C) will be monitored. This is a simple blood test. It measures your blood sugar control over the last 2 to 3 months. You will receive this test every 3 to 6 months. Using this medication with insulin or a sulfonylurea may increase your risk of hypoglycemia. Learn how to check your blood sugar. Learn the symptoms of low and high blood sugar and how to manage them. Always carry a quick-source of sugar with you in  case you have symptoms of low blood sugar. Examples include hard sugar candy or glucose tablets. Make sure others know that you can choke if you eat or drink when you develop serious symptoms of low blood sugar, such as seizures or unconsciousness. They must get medical help at once. Tell your care team if you have high blood sugar. You might need to change the dose of your medication. If you are sick or exercising more than usual, you might need to change the dose of your medication. Do not skip meals. Ask  your care team if you should avoid alcohol. Many nonprescription cough and cold products contain sugar or alcohol. These can affect blood sugar. This medication may cause ovulation in premenopausal women who do not have regular monthly periods. This may increase your chances of becoming pregnant. You should not take this medication if you become pregnant or think you may be pregnant. Talk with your care team about your birth control options while taking this medication. Contact your care team right away if you think you are pregnant. The tablet shell for some brands of this medication does not dissolve. This is normal. The tablet shell may appear whole in the stool. This is not a cause for concern. If you are going to need surgery, an MRI, CT scan, or other procedure, tell your health care provider that you are taking this medication. You may need to stop taking this medication before the procedure. Wear a medical ID bracelet or chain, and carry a card that describes your disease and details of your medication and dosage times. This medication may cause a decrease in folic acid and vitamin B12. You should make sure that you get enough vitamins while you are taking this medication. Discuss the foods you eat and the vitamins you take with your care team. What side effects may I notice from receiving this medication? Side effects that you should report to your care team as soon as possible: Allergic reactions--skin rash, itching, hives, swelling of the face, lips, tongue, or throat High lactic acid level--muscle pain or cramps, stomach pain, trouble breathing, general discomfort or fatigue Low vitamin B12 level--pain, tingling, or numbness in the hands or feet, muscle weakness, dizziness, confusion, difficulty concentrating Side effects that usually do not require medical attention (report to your care team if they continue or are bothersome): Diarrhea Gas Headache Metallic taste in mouth Nausea This  list may not describe all possible side effects. Call your doctor for medical advice about side effects. You may report side effects to FDA at 1-800-FDA-1088. Where should I keep my medication? Keep out of the reach of children and pets. Store at room temperature between 15 and 30 degrees C (59 and 86 degrees F). Protect from light. Get rid of any unused medication after the expiration date. To get rid of medications that are no longer needed or expired: Take the medication to a medication take-back program. Check with your pharmacy or law enforcement to find a location. If you cannot return the medication, check the label or package insert to see if the medication should be thrown out in the garbage or flushed down the toilet. If you are not sure, ask your care team. If it is safe to put in the trash, empty the medication out of the container. Mix the medication with cat litter, dirt, coffee grounds, or other unwanted substance. Seal the mixture in a bag or container. Put it in the trash. NOTE: This sheet is a summary.  It may not cover all possible information. If you have questions about this medicine, talk to your doctor, pharmacist, or health care provider.  2022 Elsevier/Gold Standard (2020-11-07 00:00:00)

## 2021-12-28 NOTE — Assessment & Plan Note (Signed)
Reviewed A1C see HPI on 12/28/21 and patient would like to try Metformin XR 500 mg once day to help with prediabetes and hopefully help weight loss.  She has been working hard on diet and exercise. She has glucose monitor and declined need for supplies.  Discussed management and when to check blood glucose.

## 2021-12-28 NOTE — Assessment & Plan Note (Signed)
Amoxicillin on 12/28/21 advised to start zyrtec and flonase as well. Can use afrin nasal spray as package directions for 3 days.  Follow treatment plan from office  if not improving or any worsening within 72 hours and also return to office or open medical facility at ANYTIME if any symptoms persist, change, or worsen or you have any further concerns or questions. Call 911 immediately for emergencies.

## 2021-12-28 NOTE — Progress Notes (Signed)
CMP is within normal limits other than glucose is 112, this was addressed at office viist.   Urinalysis is within normal limits.  CBC within normal limits.  Vitamin D is low end normal would advise adding Vitamin D 3 to medications at 4,000 IU( international units) by mouth once daily.   Please add vitamin D lab, and hemoglobin A1C to labs for check in 3 months prior to office viist recheck 3 months.

## 2021-12-28 NOTE — Progress Notes (Signed)
New Patient Office Visit  Subjective:  Patient ID: Cheryl Galvan, female    DOB: 1991/09/07  Age: 31 y.o. MRN: [id number]  CC:  Chief Complaint  Patient presents with   Transitions Of Care    Saw OB couple wks ago Dr.Kavon for elevated a1c     HPI Cheryl Galvan presents for new patient establish care. She had a hemoglobin A1C of 6.1 on labs on 12/04/21. Fasting blood glucose was 113. She has been reading a lot of prediabetes and is trying to lose weight.  She has history of gestational diabetes. She has lost 20 lbs intentionally.  She is seeing bariatric clinic in [place] and doing HCG injections  and B12, and phentermine.    She has a meter. She has testing strips.   Avoids sodas and sugary foods.   TSH was ok 0.74.  PAP up to date and was last year.   Patient  denies any fever, body aches,chills, rash, chest pain, shortness of breath, nausea, vomiting, or diarrhea.  Denies dizziness, lightheadedness, pre syncopal or syncopal episodes.    Patient's last menstrual period was 12/03/2021 (exact date).   Past Medical History:  Diagnosis Date   Acne    Allergy    sesonal allergy    Amblyopia    Anxiety 2009   Asthma     no recent difficulties inhaler not needed for years   Depression    meds years ago   History of kidney stones    History of migraine    none in years   Spontaneous vaginal delivery 07/28/2020   at [facility]    Past Surgical History:  Procedure Laterality Date   CYSTOSCOPY WITH STENT PLACEMENT Right 06/29/2020   Procedure: CYSTOSCOPY WITH STENT PLACEMENT;  Surgeon: Crist Fat, MD;  Location: [facility];  Service: Urology;  Laterality: Right;   CYSTOSCOPY/URETEROSCOPY/HOLMIUM LASER/STENT PLACEMENT Right 08/08/2020   Procedure: RIGHT URETEROSCOPY//STENT EXCHANGE/;  Surgeon: Crist Fat, MD;  Location: [facility];  Service: Urology;  Laterality: Right;   WISDOM TOOTH EXTRACTION  yrs ago    Family History  Problem  Relation Age of Onset   Hypertension Mother    Allergic rhinitis Mother    Anxiety disorder Mother    Depression Mother    Alcohol abuse Father    Drug abuse Father    Allergic rhinitis Sister    Cancer Maternal Grandmother        colon   Stroke Maternal Grandmother    Diabetes Maternal Grandmother    Hypertension Maternal Grandmother    Cancer Maternal Grandfather        lung cancer   Stroke Maternal Grandfather    Cancer Paternal Grandfather        prostate   Heart disease Other    Angioedema Neg Hx    Eczema Neg Hx    Immunodeficiency Neg Hx    Urticaria Neg Hx     Social History   Socioeconomic History   Marital status: Married    Spouse name: Not on file   Number of children: 0   Years of education: Assc.    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   No ets    Caffeine Use: 1 soda daily   Patient lives at home with her family   Social Determinants of Health   Financial Resource Strain: Not on file  Food Insecurity: Not on file  Transportation Needs: Not on file  Physical Activity: Not on file  Stress: Not on file  Social Connections: Not on file  Intimate Partner Violence: Not on file    ROS Review of Systems  Constitutional: Negative.   HENT:  Positive for ear pain and rhinorrhea. Negative for postnasal drip and sore throat.   Respiratory: Negative.    Cardiovascular: Negative.   Gastrointestinal:  Negative.   Genitourinary: Negative.   Musculoskeletal: Negative.   Skin: Negative.   Psychiatric/Behavioral: Negative.     Objective:   Today's Vitals: BP 110/80 (BP Location: Left Arm, Patient Position: Sitting, Cuff Size: Normal)    Pulse 84    Temp 97.9 F (36.6 C) (Oral)    Resp 12    Ht 5\' 7"  (1.702 m)    Wt 196 lb (88.9 kg)    LMP 12/03/2021 (Exact Date)    SpO2 99%    Breastfeeding Unknown    BMI 30.70 kg/m   Physical Exam Constitutional:      General: She is not in acute distress.    Appearance: She is not ill-appearing, toxic-appearing or diaphoretic.  HENT:     Right Ear: External ear normal. A middle ear effusion is present. There is no impacted cerumen. Tympanic membrane is erythematous. Tympanic membrane is not perforated.     Left Ear: External ear normal.  No middle ear effusion. There is no impacted cerumen. Tympanic membrane is not perforated or erythematous.     Nose: Nose normal. No congestion or rhinorrhea.     Mouth/Throat:     Mouth: Mucous membranes are moist.  Neck:     Vascular: No carotid bruit.  Cardiovascular:     Rate and Rhythm: Normal rate.     Pulses: Normal pulses.     Heart sounds: Normal heart sounds. No murmur heard.   No friction rub. No gallop.  Pulmonary:     Effort: Pulmonary effort is normal. No respiratory distress.     Breath sounds: Normal breath sounds. No stridor. No wheezing, rhonchi or rales.  Chest:     Chest wall: No tenderness.  Abdominal:     Palpations: Abdomen is soft.  Musculoskeletal:        General: Normal range of motion.     Cervical back: Normal range of motion and neck supple.  Skin:    General: Skin is warm.     Findings: No erythema or rash.  Neurological:     General: No focal deficit present.     Mental Status: She is alert and oriented to person, place, and time.    Assessment & Plan:   Problem List Items Addressed This Visit       Nervous and Auditory   Non-recurrent acute serous otitis media of  right ear    Amoxicillin on 12/28/21 advised to start zyrtec and flonase as well. Can use afrin nasal spray as package directions  for 3 days.  Follow treatment plan from office  if not improving or any worsening within 72 hours and also return to office or open medical facility at ANYTIME if any symptoms persist, change, or worsen or you have any further concerns or questions. Call 911 immediately for emergencies.         Relevant Medications   amoxicillin (AMOXIL) 875 MG tablet   RESOLVED: Non-recurrent acute suppurative otitis media of right ear without spontaneous rupture of tympanic membrane   Relevant Medications   amoxicillin (AMOXIL) 875 MG tablet     Other   Pre-diabetes - Primary    Reviewed A1C see HPI on 12/28/21 and patient would like to try Metformin XR 500 mg once day to help with prediabetes and hopefully help weight loss.  She has been working hard on diet and exercise. She has glucose monitor and declined need for supplies.  Discussed management and when to check blood glucose.       Relevant Medications   metFORMIN (GLUCOPHAGE XR) 500 MG 24 hr tablet   Other Relevant Orders   CBC with Differential/Platelet (Completed)   Comprehensive metabolic panel (Completed)   Difficulty with household tasks   Relevant Orders   Ambulatory referral to Psychiatry   Difficulty concentrating    referral placed to [facility] on 12/28/21 for testing for possible ADHD.      Relevant Orders   Ambulatory referral to Psychiatry   Vitamin D deficiency    Advised Vitamin D 3 at 4,000 international units once daily recheck vitamin D level in 3 months.       Relevant Orders   VITAMIN D 25 Hydroxy (Vit-D Deficiency, Fractures) (Completed)   History of kidney stones   Relevant Orders   Urinalysis (Completed)   BMI 30.0-30.9,adult    Outpatient Encounter Medications as of 12/28/2021  Medication Sig   amoxicillin (AMOXIL) 875 MG tablet Take 1 tablet (875 mg total) by  mouth 2 (two) times daily for 10 days.   metFORMIN (GLUCOPHAGE XR) 500 MG 24 hr tablet Take 1 tablet (500 mg total) by mouth daily with breakfast.   phentermine (ADIPEX-P) 37.5 MG tablet Take 1/2 tablet by mouth once daily   cyclobenzaprine (FLEXERIL) 5 MG tablet Take 1 tablet (5 mg total) by mouth 3 (three) times daily as needed for muscle spasms. (Patient not taking: Reported on 12/28/2021)   docusate sodium (COLACE) 100 MG capsule Take 1 capsule (100 mg total) by mouth 2 (two) times daily. (Patient not taking: Reported on 12/28/2021)   mesalamine (CANASA) 1000 MG suppository Place 1 suppository (1,000 mg total) rectally at bedtime. (Patient not taking: Reported on 12/28/2021)   mesalamine (CANASA) 1000 MG suppository Place 1 suppository (1,000 mg total) rectally at bedtime. (Patient not taking: Reported on 12/28/2021)   [DISCONTINUED] doxycycline (VIBRA-TABS) 100 MG tablet Take 1 tablet (100 mg total) by mouth 2 (two) times daily. (Patient not taking: Reported on 12/28/2021)   No facility-administered encounter medications on file as of 12/28/2021.   The patient is advised to begin progressive daily aerobic exercise program, follow a low fat, low cholesterol diet, attempt to lose weight, decrease or avoid alcohol intake, reduce salt in diet and cooking, reduce exposure to stress, use calcium 1 gram daily with Vit D, continue current medications, continue current healthy lifestyle patterns, and return for routine annual checkups.  Follow-up: Return in about 3 months (around 03/28/2022), or if symptoms worsen or fail to improve, for at any time for any worsening  symptoms, Go to Emergency room/ urgent care if worse.   Jairo Ben, FNP

## 2021-12-28 NOTE — Assessment & Plan Note (Signed)
referral placed to [facility] on 12/28/21 for testing for possible ADHD.

## 2021-12-29 DIAGNOSIS — F411 Generalized anxiety disorder: Secondary | ICD-10-CM | POA: Diagnosis not present

## 2021-12-29 DIAGNOSIS — Z600 Problems of adjustment to life-cycle transitions: Secondary | ICD-10-CM | POA: Diagnosis not present

## 2021-12-29 NOTE — Addendum Note (Signed)
Addended by: Nanci Pina on: 12/29/2021 10:57 AM   Modules accepted: Orders

## 2022-01-19 DIAGNOSIS — F411 Generalized anxiety disorder: Secondary | ICD-10-CM | POA: Diagnosis not present

## 2022-01-19 DIAGNOSIS — Z600 Problems of adjustment to life-cycle transitions: Secondary | ICD-10-CM | POA: Diagnosis not present

## 2022-02-04 DIAGNOSIS — Z600 Problems of adjustment to life-cycle transitions: Secondary | ICD-10-CM | POA: Diagnosis not present

## 2022-02-04 DIAGNOSIS — F411 Generalized anxiety disorder: Secondary | ICD-10-CM | POA: Diagnosis not present

## 2022-02-19 DIAGNOSIS — F411 Generalized anxiety disorder: Secondary | ICD-10-CM | POA: Diagnosis not present

## 2022-02-19 DIAGNOSIS — Z600 Problems of adjustment to life-cycle transitions: Secondary | ICD-10-CM | POA: Diagnosis not present

## 2022-03-03 DIAGNOSIS — F411 Generalized anxiety disorder: Secondary | ICD-10-CM | POA: Diagnosis not present

## 2022-03-03 DIAGNOSIS — Z600 Problems of adjustment to life-cycle transitions: Secondary | ICD-10-CM | POA: Diagnosis not present

## 2022-03-10 DIAGNOSIS — F411 Generalized anxiety disorder: Secondary | ICD-10-CM | POA: Diagnosis not present

## 2022-03-10 DIAGNOSIS — Z600 Problems of adjustment to life-cycle transitions: Secondary | ICD-10-CM | POA: Diagnosis not present

## 2022-03-23 DIAGNOSIS — F411 Generalized anxiety disorder: Secondary | ICD-10-CM | POA: Diagnosis not present

## 2022-03-23 DIAGNOSIS — Z600 Problems of adjustment to life-cycle transitions: Secondary | ICD-10-CM | POA: Diagnosis not present

## 2022-03-24 ENCOUNTER — Other Ambulatory Visit: Payer: BC Managed Care – PPO

## 2022-03-29 ENCOUNTER — Ambulatory Visit: Payer: BC Managed Care – PPO | Admitting: Adult Health

## 2022-03-29 ENCOUNTER — Encounter: Payer: Self-pay | Admitting: Family

## 2022-03-29 ENCOUNTER — Ambulatory Visit: Payer: BC Managed Care – PPO | Admitting: Family

## 2022-03-29 VITALS — BP 94/62 | HR 83 | Temp 98.1°F | Ht 67.0 in | Wt 183.6 lb

## 2022-03-29 DIAGNOSIS — R7303 Prediabetes: Secondary | ICD-10-CM

## 2022-03-29 DIAGNOSIS — E538 Deficiency of other specified B group vitamins: Secondary | ICD-10-CM

## 2022-03-29 MED ORDER — METFORMIN HCL ER 500 MG PO TB24: 1000.0000 mg | ORAL_TABLET | Freq: Every day | ORAL | 2 refills | Status: DC

## 2022-03-29 NOTE — Patient Instructions (Signed)
Lets trial metformin ? ?Start metformin XR with one 500mg  tablet at night. After one week, you may increase to two tablets at night ( total of 1000mg ) . The third week, you may take take two tablets at night and one tablet in the morning.  ?The fourth week, you may take two tablets in the morning ( 1000mg  total) and two tablets at night (1000mg  total). This will bring you to a maximum daily dose of 2000mg /day which is maximum dose. Along the way, if you want to increase more slowly, please do as this medication can cause GI discomfort and loose stools which usually get better with time , however some patients find that they can only tolerate a certain dose and cannot increase to maximum dose.  ? ?Nice to meet you! ? ?

## 2022-03-29 NOTE — Assessment & Plan Note (Signed)
Pending b12 studies. Energy improved on b12 IM. Will resume once lab work has resulted ?

## 2022-03-29 NOTE — Progress Notes (Signed)
? ?Subjective:  ? ? Patient ID: Cheryl Galvan, female    DOB: November 04, 1991, 31 y.o.   MRN: [id number] ? ?CC: Cheryl Galvan is a 31 y.o. female who presents today to establish care.   ? ?HPI: Compliant with metformin 500mg  QD. No loose stools.  ? ?She previously had been on phentermine . ? ?Walking with double stroller daily and bodyweight training  ?She is eating better and healthier ? ?She has been giving b12 im and her mom ( who is an rn) has been giving to her. She would like to resume b12. No known h/o pernicious anemia. ? ? ? ? ? ? ? ? ? ? ?H/o Gestational DM ? ?HISTORY:  ?Past Medical History:  ?Diagnosis Date  ? Acne   ? Allergy   ? sesonal allergy   ? Amblyopia   ? Anxiety 2009  ? Asthma   ?  no recent difficulties inhaler not needed for years  ? Depression   ? meds years ago  ? History of kidney stones   ? History of migraine   ? none in years  ? [facility] ?Past Surgical History:  ?Procedure Laterality Date  ? CYSTOSCOPY WITH STENT PLACEMENT Right 06/29/2020  ? Procedure: CYSTOSCOPY WITH STENT PLACEMENT;  Surgeon: Ardis Hughs, MD;  Location: [facility];  Service: Urology;  Laterality: Right;  ? CYSTOSCOPY/URETEROSCOPY/HOLMIUM LASER/STENT PLACEMENT Right 08/08/2020  ? Procedure: RIGHT URETEROSCOPY//STENT EXCHANGE/;  Surgeon: Ardis Hughs, MD;  Location: [facility];  Service: Urology;  Laterality: Right;  ? WISDOM TOOTH EXTRACTION  yrs ago  ? ?Family History  ?Problem Relation Age of Onset  ? Hypertension Mother   ? Allergic rhinitis Mother   ? Anxiety disorder Mother   ? Depression Mother   ? Alcohol abuse Father   ? Drug abuse Father   ? Allergic rhinitis Sister   ? Cancer Maternal Grandmother   ?     colon  ? Stroke Maternal Grandmother   ? Diabetes Maternal Grandmother   ? Hypertension Maternal Grandmother   ? Cancer Maternal Grandfather   ?     lung cancer  ? Stroke Maternal Grandfather   ? Cancer Paternal Grandfather   ?      prostate  ? Heart disease Other   ? Angioedema Neg Hx   ? Eczema Neg Hx   ? Immunodeficiency Neg Hx   ? Urticaria Neg Hx   ? ? ?Allergies: Patient has no known allergies. ?No current outpatient medications on file prior to visit.  ? ?No current facility-administered medications on file prior to visit.  ? ? ?Social History  ? ?Tobacco Use  ? Smoking status: Never  ? Smokeless tobacco: Never  ?Vaping Use  ? Vaping Use: Never used  ?Substance Use Topics  ? Alcohol use: Not Currently  ? Drug use: No  ? ? ?Review of Systems  ?Constitutional:  Negative for chills and fever.  ?Respiratory:  Negative for cough.   ?Cardiovascular:  Negative for chest pain and palpitations.  ?Gastrointestinal:  Negative for nausea and vomiting.  ?   ?Objective:  ?  ?BP 94/62 (BP Location: Left Arm, Patient Position: Sitting, Cuff Size: Large)   Pulse 83   Temp 98.1 ?F (36.7 ?C) (Oral)   Ht 5\' 7"  (1.702 m)   Wt 183 lb 9.6 oz (83.3 kg)   SpO2 99%   BMI 28.76 kg/m?  ?BP Readings from Last 3 Encounters:  ?03/29/22 94/62  ?  12/28/21 110/80  ?06/16/21 114/68  ? ?Wt Readings from Last 3 Encounters:  ?03/29/22 183 lb 9.6 oz (83.3 kg)  ?12/28/21 196 lb (88.9 kg)  ?06/16/21 210 lb (95.3 kg)  ? ? ?Physical Exam ?Vitals reviewed.  ?Constitutional:   ?   Appearance: She is well-developed.  ?Eyes:  ?   Conjunctiva/sclera: Conjunctivae normal.  ?Cardiovascular:  ?   Rate and Rhythm: Normal rate and regular rhythm.  ?   Pulses: Normal pulses.  ?   Heart sounds: Normal heart sounds.  ?Pulmonary:  ?   Effort: Pulmonary effort is normal.  ?   Breath sounds: Normal breath sounds. No wheezing, rhonchi or rales.  ?Skin: ?   General: Skin is warm and dry.  ?Neurological:  ?   Mental Status: She is alert.  ?Psychiatric:     ?   Speech: Speech normal.     ?   Behavior: Behavior normal.     ?   Thought Content: Thought content normal.  ? ? ?   ?Assessment & Plan:  ? ?Problem List Items Addressed This Visit   ? ?  ? Other  ? B12 deficiency  ?  Pending b12 studies.  Energy improved on b12 IM. Will resume once lab work has resulted ? ?  ?  ? Relevant Orders  ? VITAMIN D 25 Hydroxy (Vit-D Deficiency, Fractures)  ? B12 and Folate Panel  ? Homocysteine  ? Intrinsic Factor Antibodies  ? Celiac Disease Ab Screen w/Rfx  ? Methylmalonic acid, serum  ? Anti-parietal antibody  ? CBC with Differential/Platelet  ? Prediabetes - Primary  ?  Pending a1c. Congratulated patient on diligence to lifestyle measures. Increase metformin to 1000mg  qd to aid in weight loss. Close follow up.  ? ?  ?  ? Relevant Medications  ? metFORMIN (GLUCOPHAGE XR) 500 MG 24 hr tablet  ? ? ? ?I have discontinued Irish Steagall's cyclobenzaprine, docusate sodium, mesalamine, mesalamine, and phentermine. I have also changed her metFORMIN. ? ? ?Meds ordered this encounter  ?Medications  ? metFORMIN (GLUCOPHAGE XR) 500 MG 24 hr tablet  ?  Sig: Take 2 tablets (1,000 mg total) by mouth daily with breakfast.  ?  Dispense:  120 tablet  ?  Refill:  2  ?  Order Specific Question:   Supervising Provider  ?  Answer:   Crecencio Mc [[id number]]  ? ? ?Return precautions given.  ? ?Risks, benefits, and alternatives of the medications and treatment plan prescribed today were discussed, and patient expressed understanding.  ? ?Education regarding symptom management and diagnosis given to patient on AVS. ? ?Continue to follow with Flinchum, Kelby Aline, FNP for routine health maintenance.  ? ?Waylan Boga and I agreed with plan.  ? ?Mable Paris, FNP ? ? ? ?

## 2022-03-29 NOTE — Assessment & Plan Note (Signed)
Pending a1c. Congratulated patient on diligence to lifestyle measures. Increase metformin to 1000mg  qd to aid in weight loss. Close follow up.  ?

## 2022-03-30 LAB — CBC WITH DIFFERENTIAL/PLATELET
Basophils Absolute: 0.1 10*3/uL (ref 0.0–0.1)
Basophils Relative: 0.6 % (ref 0.0–3.0)
Eosinophils Absolute: 0.1 10*3/uL (ref 0.0–0.7)
Eosinophils Relative: 0.9 % (ref 0.0–5.0)
HCT: 40 % (ref 36.0–46.0)
Hemoglobin: 13 g/dL (ref 12.0–15.0)
Lymphocytes Relative: 26 % (ref 12.0–46.0)
Lymphs Abs: 2.2 10*3/uL (ref 0.7–4.0)
MCHC: 32.6 g/dL (ref 30.0–36.0)
MCV: 85.9 fl (ref 78.0–100.0)
Monocytes Absolute: 0.5 10*3/uL (ref 0.1–1.0)
Monocytes Relative: 5.8 % (ref 3.0–12.0)
Neutro Abs: 5.5 10*3/uL (ref 1.4–7.7)
Neutrophils Relative %: 66.7 % (ref 43.0–77.0)
Platelets: 293 10*3/uL (ref 150.0–400.0)
RBC: 4.66 Mil/uL (ref 3.87–5.11)
RDW: 14.2 % (ref 11.5–15.5)
WBC: 8.3 10*3/uL (ref 4.0–10.5)

## 2022-03-30 LAB — CELIAC DISEASE AB SCREEN W/RFX
Antigliadin Abs, IgA: 3 units (ref 0–19)
IgA/Immunoglobulin A, Serum: 194 mg/dL (ref 87–352)
Transglutaminase IgA: <2 U/mL (ref 0–3)

## 2022-03-30 LAB — B12 AND FOLATE PANEL
Folate: 11.3 ng/mL (ref >5.9)
Vitamin B-12: >1504 pg/mL — ABNORMAL HIGH (ref 211–911)

## 2022-03-30 LAB — VITAMIN D 25 HYDROXY (VIT D DEFICIENCY, FRACTURES): VITD: 62.51 ng/mL (ref 30.00–100.00)

## 2022-03-30 LAB — HEMOGLOBIN A1C: Hgb A1c MFr Bld: 6 % (ref 4.6–6.5)

## 2022-04-01 LAB — HOMOCYSTEINE: Homocysteine: 6.8 umol/L (ref <10.4)

## 2022-04-01 LAB — METHYLMALONIC ACID, SERUM: Methylmalonic Acid, Quant: 129 nmol/L (ref 87–318)

## 2022-04-01 LAB — INTRINSIC FACTOR ANTIBODIES: Intrinsic Factor: NEGATIVE

## 2022-04-01 LAB — ANTI-PARIETAL ANTIBODY: PARIETAL CELL AB SCREEN: NEGATIVE

## 2022-04-06 DIAGNOSIS — Z600 Problems of adjustment to life-cycle transitions: Secondary | ICD-10-CM | POA: Diagnosis not present

## 2022-04-06 DIAGNOSIS — F411 Generalized anxiety disorder: Secondary | ICD-10-CM | POA: Diagnosis not present

## 2022-04-26 ENCOUNTER — Ambulatory Visit: Payer: BC Managed Care – PPO | Admitting: Family Medicine

## 2022-04-26 ENCOUNTER — Encounter: Payer: Self-pay | Admitting: Family Medicine

## 2022-04-26 VITALS — BP 120/80 | HR 105 | Temp 98.4°F | Ht 66.0 in | Wt 181.2 lb

## 2022-04-26 DIAGNOSIS — Z3201 Encounter for pregnancy test, result positive: Secondary | ICD-10-CM

## 2022-04-26 DIAGNOSIS — J020 Streptococcal pharyngitis: Secondary | ICD-10-CM | POA: Diagnosis not present

## 2022-04-26 DIAGNOSIS — R051 Acute cough: Secondary | ICD-10-CM

## 2022-04-26 DIAGNOSIS — J029 Acute pharyngitis, unspecified: Secondary | ICD-10-CM

## 2022-04-26 LAB — POC COVID19 BINAXNOW: SARS Coronavirus 2 Ag: NEGATIVE

## 2022-04-26 LAB — POCT RAPID STREP A (OFFICE): Rapid Strep A Screen: POSITIVE — AB

## 2022-04-26 MED ORDER — PENICILLIN V POTASSIUM 500 MG PO TABS: 500.0000 mg | ORAL_TABLET | Freq: Three times a day (TID) | ORAL | 0 refills | Status: AC

## 2022-04-26 NOTE — Assessment & Plan Note (Addendum)
Positive home pregnancy test.  We will confirm with beta-hCG.  She will then be able to schedule with her OB.

## 2022-04-26 NOTE — Assessment & Plan Note (Signed)
Patient's symptoms are likely related to strep pharyngitis.  She may have had some viral illness on top of this as well.  Her rapid COVID test was negative.  We will treat with penicillin.  I reviewed up-to-date information on pregnancy considerations and they noted penicillin is widely used in pregnant patients and based on available data penicillin is generally considered compatible for use during pregnancy.  She was advised to complete the entire course of the antibiotic.  Discussed that if she is not starting to feel better in the next few days she needs to let us know.

## 2022-04-26 NOTE — Progress Notes (Signed)
Marikay Alar, MD Phone: [phone]  Cheryl Galvan is a 31 y.o. female who presents today for same-day visit.  Sore throat: Patient notes onset of symptoms about a week ago.  She has had sore throat that got worse over the last several days.  She had some cough.  She notes some headache on the left side.  She notes not much congestion.  She has some rhinorrhea.  Some postnasal drip.  No fevers.  Her left ear is bothering her though does not feel infected.  No shortness of breath, taste, or smell disturbances.  She notes no COVID exposures.  She has not tested herself for COVID.  She has been taking Mucinex and ibuprofen though she stopped those as she recently found out she was pregnant.  LMP was right around a month ago as she was supposed to have her menstrual cycle 2 days ago.  She does note a history of migraines many years ago.  She had a left-sided headache with some photophobia.  Social History   Tobacco Use  Smoking Status Never  Smokeless Tobacco Never    Current Outpatient Medications on File Prior to Visit  Medication Sig Dispense Refill   metFORMIN (GLUCOPHAGE XR) 500 MG 24 hr tablet Take 2 tablets (1,000 mg total) by mouth daily with breakfast. 120 tablet 2   No current facility-administered medications on file prior to visit.     ROS see history of present illness  Objective  Physical Exam Vitals:   04/26/22 1526  BP: 120/80  Pulse: (!) 105  Temp: 98.4 F (36.9 C)  SpO2: 99%    BP Readings from Last 3 Encounters:  04/26/22 120/80  03/29/22 94/62  12/28/21 110/80   Wt Readings from Last 3 Encounters:  04/26/22 181 lb 3.2 oz (82.2 kg)  03/29/22 183 lb 9.6 oz (83.3 kg)  12/28/21 196 lb (88.9 kg)    Physical Exam Constitutional:      General: She is not in acute distress.    Appearance: She is not diaphoretic.  HENT:     Right Ear: Tympanic membrane normal.     Left Ear: Tympanic membrane normal.     Mouth/Throat:     Pharynx: Posterior  oropharyngeal erythema present. No oropharyngeal exudate.     Comments: Some postnasal drip noted Cardiovascular:     Rate and Rhythm: Normal rate and regular rhythm.     Heart sounds: Normal heart sounds.  Pulmonary:     Effort: Pulmonary effort is normal.     Breath sounds: Normal breath sounds.  Skin:    General: Skin is warm and dry.  Neurological:     Mental Status: She is alert.     Assessment/Plan: Please see individual problem list.  Problem List Items Addressed This Visit     Positive pregnancy test    Positive home pregnancy test.  We will confirm with beta-hCG.  She will then be able to schedule with her OB.       Relevant Orders   hCG, quantitative, pregnancy   Strep pharyngitis    Patient's symptoms are likely related to strep pharyngitis.  She may have had some viral illness on top of this as well.  Her rapid COVID test was negative.  We will treat with penicillin.  I reviewed up-to-date information on pregnancy considerations and they noted penicillin is widely used in pregnant patients and based on available data penicillin is generally considered compatible for use during pregnancy.  She was advised to complete  the entire course of the antibiotic.  Discussed that if she is not starting to feel better in the next few days she needs to let us know.       Relevant Medications   penicillin v potassium (VEETID) 500 MG tablet   Other Visit Diagnoses     Sore throat    -  Primary   Relevant Orders   POCT rapid strep A (Completed)   Acute cough       Relevant Orders   POC COVID-19 (Completed)       Return if symptoms worsen or fail to improve.   Marikay Alar, MD [facility]

## 2022-04-26 NOTE — Progress Notes (Signed)
Cheryl Galvan °

## 2022-04-26 NOTE — Patient Instructions (Signed)
Nice to see you. You have strep throat.  We will treat you with penicillin.  If you are not improving with this please let me know. We ordered your beta-hCG and we will contact you with that result. You need to remain home until you have had at least 24 hours of antibiotics in your system.

## 2022-04-27 ENCOUNTER — Telehealth: Payer: Self-pay | Admitting: Adult Health

## 2022-04-27 LAB — HCG, QUANTITATIVE, PREGNANCY: Quantitative HCG: 79.08 m[IU]/mL

## 2022-04-27 NOTE — Telephone Encounter (Signed)
I called the patient and let her know I faxed the results to the OB/ GYN office. Confirmation given.  Aislee Landgren,cma

## 2022-04-27 NOTE — Telephone Encounter (Signed)
Pt called in stating that she received her result through mychart stating that she is positive for  pregnancy... Pt stated that she is experiencing some vignal bleeding... Pt stated that she called her OB-GYN to schedule an appointment to discuss vignal bleeding and Dr. Ronita Hipps (OBGYN) office advised pt that she needs to have the positive pregnancy results fax over to there office before she can be schedule to be seen... Pt is requesting for positive pregnancy results to be faxed over to [phone]... Dr. Ronita Hipps is at [facility]... Pt requesting callback

## 2022-04-28 ENCOUNTER — Ambulatory Visit: Payer: BC Managed Care – PPO | Admitting: Family

## 2022-04-28 DIAGNOSIS — O26859 Spotting complicating pregnancy, unspecified trimester: Secondary | ICD-10-CM | POA: Diagnosis not present

## 2022-04-30 DIAGNOSIS — Z3202 Encounter for pregnancy test, result negative: Secondary | ICD-10-CM | POA: Diagnosis not present

## 2022-04-30 NOTE — Telephone Encounter (Signed)
Noted  

## 2022-04-30 NOTE — Telephone Encounter (Signed)
Spoke with pt and she stated that she was seen by OB on Wednesday to make sure her HCG levels were continuing to increase and they were. Pt stated that since she had the bleeding spell they are making her go back to have HCG levels checked every 48 hrs, so she said she just left there when I called her.

## 2022-04-30 NOTE — Telephone Encounter (Signed)
Please follow-up with the patient to make sure she was seen by OB.  Thanks.

## 2022-05-05 DIAGNOSIS — F411 Generalized anxiety disorder: Secondary | ICD-10-CM | POA: Diagnosis not present

## 2022-05-05 DIAGNOSIS — Z600 Problems of adjustment to life-cycle transitions: Secondary | ICD-10-CM | POA: Diagnosis not present

## 2022-05-12 ENCOUNTER — Ambulatory Visit: Payer: BC Managed Care – PPO | Admitting: Family Medicine

## 2022-05-12 ENCOUNTER — Encounter: Payer: Self-pay | Admitting: Family Medicine

## 2022-05-12 DIAGNOSIS — J020 Streptococcal pharyngitis: Secondary | ICD-10-CM | POA: Diagnosis not present

## 2022-05-12 LAB — POCT RAPID STREP A (OFFICE): Rapid Strep A Screen: NEGATIVE

## 2022-05-12 NOTE — Progress Notes (Signed)
  Marikay Alar, MD Phone: [phone]  Cheryl Galvan is a 31 y.o. female who presents today for follow-up.  Sore throat: Patient was recently treated for strep throat with penicillin.  She was taking the penicillin 3 times a day for the first couple of days and then was mostly taking it twice daily after that.  She notes her sore throat improved quite a bit though she does continue to have some mild sore throat over the last week after finishing her antibiotics.  No trouble swallowing.  No congestion or rhinorrhea.  Social History   Tobacco Use  Smoking Status Never  Smokeless Tobacco Never    Current Outpatient Medications on File Prior to Visit  Medication Sig Dispense Refill   metFORMIN (GLUCOPHAGE XR) 500 MG 24 hr tablet Take 2 tablets (1,000 mg total) by mouth daily with breakfast. 120 tablet 2   sulfamethoxazole-trimethoprim (BACTRIM DS) 800-160 MG tablet Take 1 tablet by mouth 2 (two) times daily.     No current facility-administered medications on file prior to visit.     ROS see history of present illness  Objective  Physical Exam Vitals:   05/12/22 1511  BP: 100/60  Pulse: 90  Temp: 98.2 F (36.8 C)  SpO2: 99%    BP Readings from Last 3 Encounters:  05/12/22 100/60  04/26/22 120/80  03/29/22 94/62   Wt Readings from Last 3 Encounters:  05/12/22 183 lb 3.2 oz (83.1 kg)  04/26/22 181 lb 3.2 oz (82.2 kg)  03/29/22 183 lb 9.6 oz (83.3 kg)    Physical Exam Constitutional:      General: She is not in acute distress.    Appearance: She is not diaphoretic.  HENT:     Mouth/Throat:     Mouth: Mucous membranes are moist.     Pharynx: No oropharyngeal exudate.     Comments: Minimal posterior oropharyngeal erythema Cardiovascular:     Rate and Rhythm: Normal rate and regular rhythm.     Heart sounds: Normal heart sounds.  Pulmonary:     Effort: Pulmonary effort is normal.     Breath sounds: Normal breath sounds.  Skin:    General: Skin is warm and  dry.  Neurological:     Mental Status: She is alert.     Assessment/Plan: Please see individual problem list.  Problem List Items Addressed This Visit     Strep pharyngitis    Patient improved quite a bit with completion of penicillin though continues to have some sore throat that is not improving.  Follow-up strep testing negative.  Current symptoms could be drainage related.  Discussed she could try claritin if desired. Discussed the potential for a false positive strep test. She will monitor her symptoms and if not improving by next Monday she will let me know.        Relevant Medications   sulfamethoxazole-trimethoprim (BACTRIM DS) 800-160 MG tablet     Return if symptoms worsen or fail to improve.   Marikay Alar, MD [facility]

## 2022-05-12 NOTE — Assessment & Plan Note (Signed)
Patient improved quite a bit with completion of penicillin though continues to have some sore throat that is not improving.  Follow-up strep testing negative.  Current symptoms could be drainage related.  Discussed she could try claritin if desired. Discussed the potential for a false positive strep test. She will monitor her symptoms and if not improving by next Monday she will let me know.

## 2022-05-12 NOTE — Addendum Note (Signed)
Addended by: Charlyne Mom D on: 05/12/2022 04:05 PM   Modules accepted: Orders

## 2022-05-14 ENCOUNTER — Inpatient Hospital Stay (HOSPITAL_COMMUNITY): Payer: BC Managed Care – PPO

## 2022-05-14 ENCOUNTER — Other Ambulatory Visit: Payer: Self-pay

## 2022-05-14 ENCOUNTER — Inpatient Hospital Stay (HOSPITAL_COMMUNITY)
Admission: AD | Admit: 2022-05-14 | Discharge: 2022-05-14 | Disposition: A | Discharge status: Home / Self Care | Payer: BC Managed Care – PPO | Attending: Obstetrics and Gynecology | Admitting: Obstetrics and Gynecology

## 2022-05-14 ENCOUNTER — Encounter (HOSPITAL_COMMUNITY): Payer: Self-pay | Admitting: Obstetrics and Gynecology

## 2022-05-14 ENCOUNTER — Emergency Department
Admission: EM | Admit: 2022-05-14 | Discharge: 2022-05-14 | Discharge status: Against Medical Advice | Payer: BC Managed Care – PPO

## 2022-05-14 DIAGNOSIS — Z3A01 Less than 8 weeks gestation of pregnancy: Secondary | ICD-10-CM | POA: Diagnosis not present

## 2022-05-14 DIAGNOSIS — O26891 Other specified pregnancy related conditions, first trimester: Secondary | ICD-10-CM | POA: Diagnosis not present

## 2022-05-14 DIAGNOSIS — O418X10 Other specified disorders of amniotic fluid and membranes, first trimester, not applicable or unspecified: Secondary | ICD-10-CM

## 2022-05-14 DIAGNOSIS — O208 Other hemorrhage in early pregnancy: Secondary | ICD-10-CM | POA: Diagnosis not present

## 2022-05-14 DIAGNOSIS — Z3201 Encounter for pregnancy test, result positive: Secondary | ICD-10-CM | POA: Diagnosis not present

## 2022-05-14 DIAGNOSIS — O4690 Antepartum hemorrhage, unspecified, unspecified trimester: Secondary | ICD-10-CM

## 2022-05-14 DIAGNOSIS — R1031 Right lower quadrant pain: Secondary | ICD-10-CM | POA: Insufficient documentation

## 2022-05-14 DIAGNOSIS — N939 Abnormal uterine and vaginal bleeding, unspecified: Secondary | ICD-10-CM | POA: Diagnosis not present

## 2022-05-14 NOTE — MAU Provider Note (Signed)
History     CSN: [id number]  Arrival date and time: 05/14/22 1741   Event Date/Time   First Provider Initiated Contact with Patient 05/14/22 1824      Chief Complaint  Patient presents with   Vaginal Bleeding   Abdominal Pain   HPI  [name] is a 31 y.o. female [id number] @ [redacted]w[redacted]d here in MAU with complaints of vaginal bleeding and right lower quadrant pain. The bleeding started in May however it stopped. This morning she woke up and started having light bleeding again. She notices it only when she wipes and the color is bright red. The pain comes and goes. She has not taken anything for the pain. She went to her OB office today Cheryl Galvan) and had a quant drawn. She has not had an US done.   QuantsL May 24: 225 May 26: 467 June 9: 27000  OB History     Gravida  3   Para  2   Term  2   Preterm      AB      Living  2      SAB      IAB      Ectopic      Multiple  0   Live Births  2           Past Medical History:  Diagnosis Date   Acne    Allergy    sesonal allergy    Amblyopia    Anxiety 2009   Asthma     no recent difficulties inhaler not needed for years   Depression    meds years ago   History of kidney stones    History of migraine    none in years   Spontaneous vaginal delivery 07/28/2020   at [facility]    Past Surgical History:  Procedure Laterality Date   CYSTOSCOPY WITH STENT PLACEMENT Right 06/29/2020   Procedure: CYSTOSCOPY WITH STENT PLACEMENT;  Surgeon: Crist Fat, MD;  Location: [facility] ORS;  Service: Urology;  Laterality: Right;   CYSTOSCOPY/URETEROSCOPY/HOLMIUM LASER/STENT PLACEMENT Right 08/08/2020   Procedure: RIGHT URETEROSCOPY//STENT EXCHANGE/;  Surgeon: Crist Fat, MD;  Location: [facility];  Service: Urology;  Laterality: Right;   WISDOM TOOTH EXTRACTION  yrs ago    Family History  Problem Relation Age of Onset   Hypertension Mother    Allergic rhinitis Mother    Anxiety  disorder Mother    Depression Mother    Alcohol abuse Father    Drug abuse Father    Allergic rhinitis Sister    Cancer Maternal Grandmother        colon   Stroke Maternal Grandmother    Diabetes Maternal Grandmother    Hypertension Maternal Grandmother    Cancer Maternal Grandfather        lung cancer   Stroke Maternal Grandfather    Cancer Paternal Grandfather        prostate   Heart disease Other    Angioedema Neg Hx    Eczema Neg Hx    Immunodeficiency Neg Hx    Urticaria Neg Hx     Social History   Tobacco Use   Smoking status: Never   Smokeless tobacco: Never  Vaping Use   Vaping Use: Never used  Substance Use Topics   Alcohol use: Not Currently   Drug use: No    Allergies: No Known Allergies  Medications Prior to Admission  Medication Sig Dispense Refill Last Dose  metFORMIN (GLUCOPHAGE XR) 500 MG 24 hr tablet Take 2 tablets (1,000 mg total) by mouth daily with breakfast. 120 tablet 2 05/14/2022   Prenatal Vit-Fe Fumarate-FA (PRENATAL MULTIVITAMIN) TABS tablet Take 1 tablet by mouth daily at 12 noon.   05/14/2022   sulfamethoxazole-trimethoprim (BACTRIM DS) 800-160 MG tablet Take 1 tablet by mouth 2 (two) times daily.      No results found for this or any previous visit (from the past 48 hour(s)).   US OB LESS THAN 14 WEEKS WITH OB TRANSVAGINAL  Result Date: 05/14/2022 CLINICAL DATA:  Early pregnancy. Vaginal bleeding. Estimated gestational age by last menstrual period equals 6 weeks 5 days EXAM: OBSTETRIC <14 WK [place] AND TRANSVAGINAL OB US TECHNIQUE: Both transabdominal and transvaginal ultrasound examinations were performed for complete evaluation of the gestation as well as the maternal uterus, adnexal regions, and pelvic cul-de-sac. Transvaginal technique was performed to assess early pregnancy. COMPARISON:  None Available. FINDINGS: Intrauterine gestational sac: Single Yolk sac:  Present Embryo:  Present Cardiac Activity: Present Heart Rate: 119 bpm CRL:  4.6 mm    6 w   1 d                  [place] EDC: 01/06/2023 Subchorionic hemorrhage:  Moderate subchronic hemorrhage Maternal uterus/adnexae: Normal ovaries.  Trace free fluid IMPRESSION: 1. Single intrauterine gestation with embryo and normal cardiac activity. 2. Estimated gestational age by crown rump length equals 6 weeks 1 day. Electronically Signed   By: Suzy Bouchard M.D.   On: 05/14/2022 19:26     Review of Systems  Constitutional:  Negative for fever.  Gastrointestinal:  Positive for abdominal pain.  Genitourinary:  Positive for vaginal bleeding.   Physical Exam   Blood pressure 116/67, pulse 96, temperature 98.9 F (37.2 C), temperature source Oral, resp. rate 18, height 5\' 6"  (1.676 m), weight 83.5 kg, last menstrual period 03/28/2022, SpO2 100 %, not currently breastfeeding.  Physical Exam Constitutional:      General: She is not in acute distress.    Appearance: She is well-developed. She is not ill-appearing, toxic-appearing or diaphoretic.  Abdominal:     Tenderness: There is no abdominal tenderness.  Genitourinary:    Comments: Cervix closed, posterior. No blood noted on exam glove.  Exam by Noni Saupe, NP Skin:    General: Skin is warm.  Neurological:     Mental Status: She is alert and oriented to person, place, and time.    MAU Course  Procedures None  MDM  O positive blood type Discussed [place] results in detail   Assessment and Plan   A:  1. Vaginal bleeding in pregnancy   2. Subchorionic hemorrhage of placenta in first trimester, single or unspecified fetus   3. [redacted] weeks gestation of pregnancy      P:  Discharge home in stable condition Return to MAU if symptoms worsen Pelvic rest Keep your appointment with [facility] next week.  Lezlie Lye, NP 05/16/2022 9:57 AM

## 2022-05-14 NOTE — ED Notes (Signed)
Pt was advised that she would be seen in ED not in LDR due to her only bring [redacted] weeks along. Pt states that her doctors wanted her to go to [place] to be seen. Pt walked out to make a call and then came back in and stated that she was going to [place] so she could be seen at the [facility] center.

## 2022-05-14 NOTE — MAU Note (Signed)
Cheryl Galvan is a 31 y.o. at Unknown here in MAU reporting: having red bleeding and cramping on the rt side since this morning.  Called office this morning, had blood work drawn in office, was [address]. Looked like they were rising correctly, but with bleeding and cramping, told to come in. Had bleeding 5/22- had HCG levels done a few times - going up,no [place].  LMP: 4/23 Onset of complaint: this morning Pain score: 5 Vitals:   05/14/22 1801  BP: 120/69  Pulse: 91  Resp: 18  Temp: 98.9 F (37.2 C)  SpO2: 100%      Lab orders placed from triage:

## 2022-05-20 ENCOUNTER — Ambulatory Visit: Payer: BC Managed Care – PPO | Admitting: Family

## 2022-05-20 DIAGNOSIS — Z3201 Encounter for pregnancy test, result positive: Secondary | ICD-10-CM | POA: Diagnosis not present

## 2022-06-02 DIAGNOSIS — Z3A09 9 weeks gestation of pregnancy: Secondary | ICD-10-CM | POA: Diagnosis not present

## 2022-06-02 DIAGNOSIS — O208 Other hemorrhage in early pregnancy: Secondary | ICD-10-CM | POA: Diagnosis not present

## 2022-06-10 DIAGNOSIS — Z3689 Encounter for other specified antenatal screening: Secondary | ICD-10-CM | POA: Diagnosis not present

## 2022-06-10 DIAGNOSIS — Z3481 Encounter for supervision of other normal pregnancy, first trimester: Secondary | ICD-10-CM | POA: Diagnosis not present

## 2022-06-10 DIAGNOSIS — Z36 Encounter for antenatal screening for chromosomal anomalies: Secondary | ICD-10-CM | POA: Diagnosis not present

## 2022-06-10 LAB — OB RESULTS CONSOLE HIV ANTIBODY (ROUTINE TESTING): HIV: NONREACTIVE

## 2022-06-10 LAB — OB RESULTS CONSOLE ABO/RH: RH Type: POSITIVE

## 2022-06-10 LAB — OB RESULTS CONSOLE HEPATITIS B SURFACE ANTIGEN: Hepatitis B Surface Ag: NEGATIVE

## 2022-06-10 LAB — OB RESULTS CONSOLE RPR: RPR: NONREACTIVE

## 2022-06-10 LAB — OB RESULTS CONSOLE ANTIBODY SCREEN: Antibody Screen: NEGATIVE

## 2022-06-10 LAB — OB RESULTS CONSOLE RUBELLA ANTIBODY, IGM: Rubella: IMMUNE

## 2022-07-01 DIAGNOSIS — Z3481 Encounter for supervision of other normal pregnancy, first trimester: Secondary | ICD-10-CM | POA: Diagnosis not present

## 2022-07-01 DIAGNOSIS — R109 Unspecified abdominal pain: Secondary | ICD-10-CM | POA: Diagnosis not present

## 2022-07-01 LAB — OB RESULTS CONSOLE GC/CHLAMYDIA
Chlamydia: NEGATIVE
Neisseria Gonorrhea: NEGATIVE

## 2022-07-06 DIAGNOSIS — F411 Generalized anxiety disorder: Secondary | ICD-10-CM | POA: Diagnosis not present

## 2022-07-06 DIAGNOSIS — Z600 Problems of adjustment to life-cycle transitions: Secondary | ICD-10-CM | POA: Diagnosis not present

## 2022-07-07 ENCOUNTER — Ambulatory Visit: Payer: BC Managed Care – PPO | Admitting: Family

## 2022-07-22 DIAGNOSIS — O99810 Abnormal glucose complicating pregnancy: Secondary | ICD-10-CM | POA: Diagnosis not present

## 2022-07-22 DIAGNOSIS — Z361 Encounter for antenatal screening for raised alphafetoprotein level: Secondary | ICD-10-CM | POA: Diagnosis not present

## 2022-07-27 DIAGNOSIS — F411 Generalized anxiety disorder: Secondary | ICD-10-CM | POA: Diagnosis not present

## 2022-07-27 DIAGNOSIS — Z600 Problems of adjustment to life-cycle transitions: Secondary | ICD-10-CM | POA: Diagnosis not present

## 2022-08-10 DIAGNOSIS — N898 Other specified noninflammatory disorders of vagina: Secondary | ICD-10-CM | POA: Diagnosis not present

## 2022-08-18 DIAGNOSIS — Z363 Encounter for antenatal screening for malformations: Secondary | ICD-10-CM | POA: Diagnosis not present

## 2022-08-18 DIAGNOSIS — N898 Other specified noninflammatory disorders of vagina: Secondary | ICD-10-CM | POA: Diagnosis not present

## 2022-08-25 DIAGNOSIS — F411 Generalized anxiety disorder: Secondary | ICD-10-CM | POA: Diagnosis not present

## 2022-08-25 DIAGNOSIS — Z600 Problems of adjustment to life-cycle transitions: Secondary | ICD-10-CM | POA: Diagnosis not present

## 2022-09-15 DIAGNOSIS — Z600 Problems of adjustment to life-cycle transitions: Secondary | ICD-10-CM | POA: Diagnosis not present

## 2022-09-15 DIAGNOSIS — F411 Generalized anxiety disorder: Secondary | ICD-10-CM | POA: Diagnosis not present

## 2022-09-16 DIAGNOSIS — M5489 Other dorsalgia: Secondary | ICD-10-CM | POA: Diagnosis not present

## 2022-10-04 DIAGNOSIS — N898 Other specified noninflammatory disorders of vagina: Secondary | ICD-10-CM | POA: Diagnosis not present

## 2022-10-04 DIAGNOSIS — O26892 Other specified pregnancy related conditions, second trimester: Secondary | ICD-10-CM | POA: Diagnosis not present

## 2022-10-04 DIAGNOSIS — Z3A26 26 weeks gestation of pregnancy: Secondary | ICD-10-CM | POA: Diagnosis not present

## 2022-10-04 DIAGNOSIS — Z3689 Encounter for other specified antenatal screening: Secondary | ICD-10-CM | POA: Diagnosis not present

## 2022-10-04 DIAGNOSIS — Z23 Encounter for immunization: Secondary | ICD-10-CM | POA: Diagnosis not present

## 2022-10-06 DIAGNOSIS — Z600 Problems of adjustment to life-cycle transitions: Secondary | ICD-10-CM | POA: Diagnosis not present

## 2022-10-06 DIAGNOSIS — F411 Generalized anxiety disorder: Secondary | ICD-10-CM | POA: Diagnosis not present

## 2022-10-22 DIAGNOSIS — N898 Other specified noninflammatory disorders of vagina: Secondary | ICD-10-CM | POA: Diagnosis not present

## 2022-10-22 DIAGNOSIS — Z3689 Encounter for other specified antenatal screening: Secondary | ICD-10-CM | POA: Diagnosis not present

## 2022-11-05 DIAGNOSIS — Z3A31 31 weeks gestation of pregnancy: Secondary | ICD-10-CM | POA: Diagnosis not present

## 2022-11-05 DIAGNOSIS — O24419 Gestational diabetes mellitus in pregnancy, unspecified control: Secondary | ICD-10-CM | POA: Diagnosis not present

## 2022-11-08 DIAGNOSIS — F411 Generalized anxiety disorder: Secondary | ICD-10-CM | POA: Diagnosis not present

## 2022-11-08 DIAGNOSIS — Z600 Problems of adjustment to life-cycle transitions: Secondary | ICD-10-CM | POA: Diagnosis not present

## 2022-11-16 DIAGNOSIS — F411 Generalized anxiety disorder: Secondary | ICD-10-CM | POA: Diagnosis not present

## 2022-11-16 DIAGNOSIS — Z600 Problems of adjustment to life-cycle transitions: Secondary | ICD-10-CM | POA: Diagnosis not present

## 2022-11-24 DIAGNOSIS — N898 Other specified noninflammatory disorders of vagina: Secondary | ICD-10-CM | POA: Diagnosis not present

## 2022-11-24 DIAGNOSIS — Z3A33 33 weeks gestation of pregnancy: Secondary | ICD-10-CM | POA: Diagnosis not present

## 2022-11-24 DIAGNOSIS — O24419 Gestational diabetes mellitus in pregnancy, unspecified control: Secondary | ICD-10-CM | POA: Diagnosis not present

## 2022-11-30 ENCOUNTER — Telehealth: Payer: BC Managed Care – PPO | Admitting: Family Medicine

## 2022-12-03 DIAGNOSIS — Z3A35 35 weeks gestation of pregnancy: Secondary | ICD-10-CM | POA: Diagnosis not present

## 2022-12-03 DIAGNOSIS — O24414 Gestational diabetes mellitus in pregnancy, insulin controlled: Secondary | ICD-10-CM | POA: Diagnosis not present

## 2022-12-03 DIAGNOSIS — Z3685 Encounter for antenatal screening for Streptococcus B: Secondary | ICD-10-CM | POA: Diagnosis not present

## 2022-12-03 LAB — OB RESULTS CONSOLE GBS: GBS: NEGATIVE

## 2022-12-04 ENCOUNTER — Inpatient Hospital Stay (HOSPITAL_COMMUNITY)
Admission: AD | Admit: 2022-12-04 | Discharge: 2022-12-04 | Disposition: A | Discharge status: Home / Self Care | Payer: BC Managed Care – PPO | Source: Ambulatory Visit | Attending: Obstetrics and Gynecology | Admitting: Obstetrics and Gynecology

## 2022-12-04 ENCOUNTER — Encounter (HOSPITAL_COMMUNITY): Payer: Self-pay | Admitting: Obstetrics and Gynecology

## 2022-12-04 ENCOUNTER — Other Ambulatory Visit: Payer: Self-pay

## 2022-12-04 DIAGNOSIS — Z3A35 35 weeks gestation of pregnancy: Secondary | ICD-10-CM | POA: Diagnosis not present

## 2022-12-04 DIAGNOSIS — O479 False labor, unspecified: Secondary | ICD-10-CM | POA: Diagnosis not present

## 2022-12-04 DIAGNOSIS — O4703 False labor before 37 completed weeks of gestation, third trimester: Secondary | ICD-10-CM | POA: Insufficient documentation

## 2022-12-04 DIAGNOSIS — O368130 Decreased fetal movements, third trimester, not applicable or unspecified: Secondary | ICD-10-CM | POA: Diagnosis not present

## 2022-12-04 DIAGNOSIS — Z3493 Encounter for supervision of normal pregnancy, unspecified, third trimester: Secondary | ICD-10-CM

## 2022-12-04 LAB — URINALYSIS, ROUTINE W REFLEX MICROSCOPIC
Bacteria, UA: NONE SEEN
Bilirubin Urine: NEGATIVE
Glucose, UA: NEGATIVE mg/dL
Hgb urine dipstick: NEGATIVE
Ketones, ur: 20 mg/dL — AB
Nitrite: NEGATIVE
Protein, ur: NEGATIVE mg/dL
Specific Gravity, Urine: 1.013 (ref 1.005–1.030)
pH: 6 (ref 5.0–8.0)

## 2022-12-04 MED ORDER — LACTATED RINGERS IV BOLUS
1000.0000 mL | Freq: Once | INTRAVENOUS | Status: AC
  Administered 2022-12-04: 1000 mL via INTRAVENOUS

## 2022-12-04 NOTE — MAU Provider Note (Signed)
History     CSN: [id number]  Arrival date and time: 12/04/22 1527   Event Date/Time   First Provider Initiated Contact with Patient 12/04/22 1604      Chief Complaint  Patient presents with   Decreased Fetal Movement   HPI  Cheryl Galvan is a 31 y.o. G3P2002 at [redacted]w[redacted]d who presents for evaluation of decreased fetal movement and contractions. Patient reports she has been contracting regularly all day. She reports they were every 2-3 minutes and making her vomit. Patient rates the pain as a 2/10 and has not tried anything for the pain. She also reports the baby hasn't been moving normally since the contractions started. She denies any vaginal bleeding, discharge, and leaking of fluid. Denies any constipation, diarrhea or any urinary complaints.   She was diagnosed with COVID on 12/22.  OB History     Gravida  3   Para  2   Term  2   Preterm      AB      Living  2      SAB      IAB      Ectopic      Multiple  0   Live Births  2           Past Medical History:  Diagnosis Date   Acne    Allergy    sesonal allergy    Amblyopia    Anxiety 2009   Asthma     no recent difficulties inhaler not needed for years   Depression    meds years ago   History of kidney stones    History of migraine    none in years   Spontaneous vaginal delivery 07/28/2020   at [facility]    Past Surgical History:  Procedure Laterality Date   CYSTOSCOPY WITH STENT PLACEMENT Right 06/29/2020   Procedure: CYSTOSCOPY WITH STENT PLACEMENT;  Surgeon: Crist Fat, MD;  Location: [facility] ORS;  Service: Urology;  Laterality: Right;   CYSTOSCOPY/URETEROSCOPY/HOLMIUM LASER/STENT PLACEMENT Right 08/08/2020   Procedure: RIGHT URETEROSCOPY//STENT EXCHANGE/;  Surgeon: Crist Fat, MD;  Location: [facility];  Service: Urology;  Laterality: Right;   WISDOM TOOTH EXTRACTION  yrs ago    Family History  Problem Relation Age of Onset   Hypertension Mother    Allergic  rhinitis Mother    Anxiety disorder Mother    Depression Mother    Alcohol abuse Father    Drug abuse Father    Allergic rhinitis Sister    Cancer Maternal Grandmother        colon   Stroke Maternal Grandmother    Diabetes Maternal Grandmother    Hypertension Maternal Grandmother    Cancer Maternal Grandfather        lung cancer   Stroke Maternal Grandfather    Cancer Paternal Grandfather        prostate   Heart disease Other    Angioedema Neg Hx    Eczema Neg Hx    Immunodeficiency Neg Hx    Urticaria Neg Hx     Social History   Tobacco Use   Smoking status: Never   Smokeless tobacco: Never  Vaping Use   Vaping Use: Never used  Substance Use Topics   Alcohol use: Not Currently   Drug use: No    Allergies: No Known Allergies  Medications Prior to Admission  Medication Sig Dispense Refill Last Dose   metFORMIN (GLUCOPHAGE XR) 500 MG 24 hr tablet Take 2  tablets (1,000 mg total) by mouth daily with breakfast. 120 tablet 2 Past Week   Prenatal Vit-Fe Fumarate-FA (PRENATAL MULTIVITAMIN) TABS tablet Take 1 tablet by mouth daily at 12 noon.   Past Week    Review of Systems  Constitutional: Negative.  Negative for fatigue and fever.  HENT: Negative.    Respiratory: Negative.  Negative for shortness of breath.   Cardiovascular: Negative.  Negative for chest pain.  Gastrointestinal:  Positive for abdominal pain. Negative for constipation, diarrhea, nausea and vomiting.  Genitourinary: Negative.  Negative for dysuria, vaginal bleeding and vaginal discharge.  Neurological: Negative.  Negative for dizziness and headaches.   Physical Exam   Blood pressure 116/81, pulse (!) 119, temperature 98 F (36.7 C), temperature source Oral, resp. rate 18, last menstrual period 03/28/2022.  Patient Vitals for the past 24 hrs:  BP Temp Temp src Pulse Resp  12/04/22 1549 116/81 98 F (36.7 C) Oral (!) 119 18    Physical Exam Vitals and nursing note reviewed.  Constitutional:       General: She is not in acute distress.    Appearance: She is well-developed.  HENT:     Head: Normocephalic.  Eyes:     Pupils: Pupils are equal, round, and reactive to light.  Cardiovascular:     Rate and Rhythm: Normal rate and regular rhythm.     Heart sounds: Normal heart sounds.  Pulmonary:     Effort: Pulmonary effort is normal. No respiratory distress.     Breath sounds: Normal breath sounds.  Abdominal:     General: Bowel sounds are normal. There is no distension.     Palpations: Abdomen is soft.     Tenderness: There is no abdominal tenderness.  Skin:    General: Skin is warm and dry.  Neurological:     Mental Status: She is alert and oriented to person, place, and time.  Psychiatric:        Mood and Affect: Mood normal.        Behavior: Behavior normal.        Thought Content: Thought content normal.        Judgment: Judgment normal.     Fetal Tracing:  Baseline: 135 Variability: moderate Accels: 15x15 Decels: none  Toco: ui  Dilation: 2 Exam by:: Jerald Kief CNM   MAU Course  Procedures  Results for orders placed or performed during the hospital encounter of 12/04/22 (from the past 24 hour(s))  Urinalysis, Routine w reflex microscopic Urine, Clean Catch     Status: Abnormal   Collection Time: 12/04/22  4:09 PM  Result Value Ref Range   Color, Urine YELLOW YELLOW   APPearance CLEAR CLEAR   Specific Gravity, Urine 1.013 1.005 - 1.030   pH 6.0 5.0 - 8.0   Glucose, UA NEGATIVE NEGATIVE mg/dL   Hgb urine dipstick NEGATIVE NEGATIVE   Bilirubin Urine NEGATIVE NEGATIVE   Ketones, ur 20 (A) NEGATIVE mg/dL   Protein, ur NEGATIVE NEGATIVE mg/dL   Nitrite NEGATIVE NEGATIVE   Leukocytes,Ua LARGE (A) NEGATIVE   RBC / HPF 0-5 0 - 5 RBC/hpf   WBC, UA 11-20 0 - 5 WBC/hpf   Bacteria, UA NONE SEEN NONE SEEN   Squamous Epithelial / LPF 0-5 0 - 5 /HPF   MDM Labs ordered and reviewed.   UA Cervix unchanged from office LR bolus NST reactive Patient reports  normal fetal movement in MAU  Cervix unchanged. Offered terbutaline for comfort and patient declines. Labor precautions reviewed  at length.   Assessment and Plan   1. Movement of fetus present during pregnancy in third trimester   2. [redacted] weeks gestation of pregnancy   3. Braxton Hicks contractions     -Discharge home in stable condition -COVID precautions discussed -Patient advised to follow-up with OB as scheduled for prenatal care -Patient may return to MAU as needed or if her condition were to change or worsen  Rolm Bookbinder, CNM 12/04/2022, 5:55 PM

## 2022-12-04 NOTE — MAU Note (Signed)
Pt presents with c/o constant mild ctx since 1330 every 2-65min. Pain 2/10.  Decreased FM since 0700 today. COVID positive on 12/25, states COVID symptoms has improved minus nasal congestion, afebrile since 12/27. Pt reports 4 episodes of vomiting today. Unable to keep food down but able to tolerate liquids

## 2022-12-04 NOTE — Discharge Instructions (Signed)

## 2022-12-06 NOTE — L&D Delivery Note (Signed)
Delivery Note At 2:39 PM a viable and healthy female was delivered via Vaginal, Spontaneous (Presentation:  LOA    ).  APGAR: 8, 9; weight pending .   Placenta status: Spontaneous, Intact.  Cord: 3 vessels with the following complications: None.  Cord pH: na  Anesthesia: Epidural Episiotomy: None Lacerations: 1st degree Suture Repair: 3.0 vicryl rapide Est. Blood Loss (mL): 112  Mom to postpartum.  Baby to Couplet care / Skin to Skin.  [place] Doster J 12/27/2022, 2:52 PM

## 2022-12-08 DIAGNOSIS — O24419 Gestational diabetes mellitus in pregnancy, unspecified control: Secondary | ICD-10-CM | POA: Diagnosis not present

## 2022-12-08 DIAGNOSIS — Z3A35 35 weeks gestation of pregnancy: Secondary | ICD-10-CM | POA: Diagnosis not present

## 2022-12-11 DIAGNOSIS — J0140 Acute pansinusitis, unspecified: Secondary | ICD-10-CM | POA: Diagnosis not present

## 2022-12-15 DIAGNOSIS — Z3A36 36 weeks gestation of pregnancy: Secondary | ICD-10-CM | POA: Diagnosis not present

## 2022-12-15 DIAGNOSIS — O24419 Gestational diabetes mellitus in pregnancy, unspecified control: Secondary | ICD-10-CM | POA: Diagnosis not present

## 2022-12-21 ENCOUNTER — Telehealth (HOSPITAL_COMMUNITY): Payer: Self-pay | Admitting: *Deleted

## 2022-12-21 ENCOUNTER — Encounter (HOSPITAL_COMMUNITY): Payer: Self-pay | Admitting: *Deleted

## 2022-12-21 NOTE — Telephone Encounter (Signed)
Preadmission screen  

## 2022-12-22 DIAGNOSIS — O24414 Gestational diabetes mellitus in pregnancy, insulin controlled: Secondary | ICD-10-CM | POA: Diagnosis not present

## 2022-12-22 DIAGNOSIS — Z3A37 37 weeks gestation of pregnancy: Secondary | ICD-10-CM | POA: Diagnosis not present

## 2022-12-23 ENCOUNTER — Other Ambulatory Visit: Payer: Self-pay | Admitting: Obstetrics and Gynecology

## 2022-12-27 ENCOUNTER — Other Ambulatory Visit: Payer: Self-pay

## 2022-12-27 ENCOUNTER — Inpatient Hospital Stay (HOSPITAL_COMMUNITY): Payer: BC Managed Care – PPO

## 2022-12-27 ENCOUNTER — Inpatient Hospital Stay (HOSPITAL_COMMUNITY): Payer: BC Managed Care – PPO | Admitting: Anesthesiology

## 2022-12-27 ENCOUNTER — Inpatient Hospital Stay (HOSPITAL_COMMUNITY)
Admission: RE | Admit: 2022-12-27 | Discharge: 2022-12-28 | DRG: 807 | Disposition: A | Discharge status: Home / Self Care | Payer: BC Managed Care – PPO | Attending: Obstetrics and Gynecology | Admitting: Obstetrics and Gynecology

## 2022-12-27 ENCOUNTER — Encounter (HOSPITAL_COMMUNITY): Payer: Self-pay | Admitting: Obstetrics and Gynecology

## 2022-12-27 DIAGNOSIS — Z3490 Encounter for supervision of normal pregnancy, unspecified, unspecified trimester: Secondary | ICD-10-CM | POA: Diagnosis present

## 2022-12-27 DIAGNOSIS — J45909 Unspecified asthma, uncomplicated: Secondary | ICD-10-CM | POA: Diagnosis not present

## 2022-12-27 DIAGNOSIS — O403XX0 Polyhydramnios, third trimester, not applicable or unspecified: Secondary | ICD-10-CM | POA: Diagnosis present

## 2022-12-27 DIAGNOSIS — Z0542 Observation and evaluation of newborn for suspected metabolic condition ruled out: Secondary | ICD-10-CM | POA: Diagnosis not present

## 2022-12-27 DIAGNOSIS — F325 Major depressive disorder, single episode, in full remission: Secondary | ICD-10-CM | POA: Diagnosis present

## 2022-12-27 DIAGNOSIS — Z87442 Personal history of urinary calculi: Secondary | ICD-10-CM

## 2022-12-27 DIAGNOSIS — O99344 Other mental disorders complicating childbirth: Secondary | ICD-10-CM | POA: Diagnosis present

## 2022-12-27 DIAGNOSIS — Z23 Encounter for immunization: Secondary | ICD-10-CM | POA: Diagnosis not present

## 2022-12-27 DIAGNOSIS — Z91199 Patient's noncompliance with other medical treatment and regimen due to unspecified reason: Secondary | ICD-10-CM

## 2022-12-27 DIAGNOSIS — O9952 Diseases of the respiratory system complicating childbirth: Secondary | ICD-10-CM | POA: Diagnosis not present

## 2022-12-27 DIAGNOSIS — O9902 Anemia complicating childbirth: Secondary | ICD-10-CM | POA: Diagnosis present

## 2022-12-27 DIAGNOSIS — Z3A38 38 weeks gestation of pregnancy: Secondary | ICD-10-CM

## 2022-12-27 DIAGNOSIS — Z3A39 39 weeks gestation of pregnancy: Secondary | ICD-10-CM | POA: Diagnosis not present

## 2022-12-27 DIAGNOSIS — Z0000 Encounter for general adult medical examination without abnormal findings: Secondary | ICD-10-CM

## 2022-12-27 DIAGNOSIS — O24425 Gestational diabetes mellitus in childbirth, controlled by oral hypoglycemic drugs: Principal | ICD-10-CM | POA: Diagnosis present

## 2022-12-27 LAB — TYPE AND SCREEN
ABO/RH(D): O POS
Antibody Screen: NEGATIVE

## 2022-12-27 LAB — CBC
HCT: 34.2 % — ABNORMAL LOW (ref 36.0–46.0)
Hemoglobin: 10.9 g/dL — ABNORMAL LOW (ref 12.0–15.0)
MCH: 26.5 pg (ref 26.0–34.0)
MCHC: 31.9 g/dL (ref 30.0–36.0)
MCV: 83 fL (ref 80.0–100.0)
Platelets: 217 10*3/uL (ref 150–400)
RBC: 4.12 MIL/uL (ref 3.87–5.11)
RDW: 14.8 % (ref 11.5–15.5)
WBC: 8.7 10*3/uL (ref 4.0–10.5)
nRBC: 0 % (ref 0.0–0.2)

## 2022-12-27 LAB — GLUCOSE, CAPILLARY
Glucose-Capillary: 145 mg/dL — ABNORMAL HIGH (ref 70–99)
Glucose-Capillary: 77 mg/dL (ref 70–99)

## 2022-12-27 LAB — RPR: RPR Ser Ql: NONREACTIVE

## 2022-12-27 MED ORDER — OXYTOCIN BOLUS FROM INFUSION
333.0000 mL | Freq: Once | INTRAVENOUS | Status: AC
  Administered 2022-12-27: 333 mL via INTRAVENOUS

## 2022-12-27 MED ORDER — ZOLPIDEM TARTRATE 5 MG PO TABS: 5.0000 mg | ORAL_TABLET | Freq: Every evening | ORAL | Status: DC | PRN

## 2022-12-27 MED ORDER — ONDANSETRON HCL 4 MG/2ML IJ SOLN: 4.0000 mg | INTRAMUSCULAR | Status: DC | PRN

## 2022-12-27 MED ORDER — PHENYLEPHRINE 80 MCG/ML (10ML) SYRINGE FOR IV PUSH (FOR BLOOD PRESSURE SUPPORT): 80.0000 ug | PREFILLED_SYRINGE | INTRAVENOUS | Status: DC | PRN

## 2022-12-27 MED ORDER — OXYTOCIN-SODIUM CHLORIDE 30-0.9 UT/500ML-% IV SOLN: 2.5000 [IU]/h | INTRAVENOUS | Status: DC

## 2022-12-27 MED ORDER — ACETAMINOPHEN 325 MG PO TABS
650.0000 mg | ORAL_TABLET | ORAL | Status: DC | PRN
  Administered 2022-12-28 (×2): 650 mg via ORAL
  Filled 2022-12-27 (×2): qty 2

## 2022-12-27 MED ORDER — OXYCODONE-ACETAMINOPHEN 5-325 MG PO TABS: 1.0000 | ORAL_TABLET | ORAL | Status: DC | PRN

## 2022-12-27 MED ORDER — METHYLERGONOVINE MALEATE 0.2 MG/ML IJ SOLN: 0.2000 mg | INTRAMUSCULAR | Status: DC | PRN

## 2022-12-27 MED ORDER — SENNOSIDES-DOCUSATE SODIUM 8.6-50 MG PO TABS
2.0000 | ORAL_TABLET | Freq: Every day | ORAL | Status: DC
  Administered 2022-12-28: 2 via ORAL
  Filled 2022-12-27: qty 2

## 2022-12-27 MED ORDER — OXYTOCIN-SODIUM CHLORIDE 30-0.9 UT/500ML-% IV SOLN
1.0000 m[IU]/min | INTRAVENOUS | Status: DC
  Administered 2022-12-27: 2 m[IU]/min via INTRAVENOUS
  Filled 2022-12-27: qty 500

## 2022-12-27 MED ORDER — TERBUTALINE SULFATE 1 MG/ML IJ SOLN: 0.2500 mg | Freq: Once | INTRAMUSCULAR | Status: DC | PRN

## 2022-12-27 MED ORDER — FENTANYL-BUPIVACAINE-NACL 0.5-0.125-0.9 MG/250ML-% EP SOLN
12.0000 mL/h | EPIDURAL | Status: DC | PRN
  Administered 2022-12-27: 12 mL/h via EPIDURAL
  Filled 2022-12-27: qty 250

## 2022-12-27 MED ORDER — DIPHENHYDRAMINE HCL 25 MG PO CAPS: 25.0000 mg | ORAL_CAPSULE | Freq: Four times a day (QID) | ORAL | Status: DC | PRN

## 2022-12-27 MED ORDER — LIDOCAINE HCL (PF) 1 % IJ SOLN
INTRAMUSCULAR | Status: DC | PRN
  Administered 2022-12-27 (×2): 5 mL via EPIDURAL

## 2022-12-27 MED ORDER — FENTANYL-BUPIVACAINE-NACL 0.5-0.125-0.9 MG/250ML-% EP SOLN: 12.0000 mL/h | EPIDURAL | Status: DC | PRN

## 2022-12-27 MED ORDER — LACTATED RINGERS IV SOLN: 500.0000 mL | Freq: Once | INTRAVENOUS | Status: DC

## 2022-12-27 MED ORDER — SIMETHICONE 80 MG PO CHEW
80.0000 mg | CHEWABLE_TABLET | ORAL | Status: DC | PRN
  Administered 2022-12-28: 80 mg via ORAL
  Filled 2022-12-27: qty 1

## 2022-12-27 MED ORDER — LIDOCAINE HCL (PF) 1 % IJ SOLN: 30.0000 mL | INTRAMUSCULAR | Status: DC | PRN

## 2022-12-27 MED ORDER — PRENATAL MULTIVITAMIN CH
1.0000 | ORAL_TABLET | Freq: Every day | ORAL | Status: DC
  Administered 2022-12-28: 1 via ORAL
  Filled 2022-12-27: qty 1

## 2022-12-27 MED ORDER — LACTATED RINGERS IV SOLN: INTRAVENOUS | Status: DC

## 2022-12-27 MED ORDER — ONDANSETRON HCL 4 MG PO TABS: 4.0000 mg | ORAL_TABLET | ORAL | Status: DC | PRN

## 2022-12-27 MED ORDER — EPHEDRINE 5 MG/ML INJ: 10.0000 mg | INTRAVENOUS | Status: DC | PRN

## 2022-12-27 MED ORDER — DIBUCAINE (PERIANAL) 1 % EX OINT: 1.0000 | TOPICAL_OINTMENT | CUTANEOUS | Status: DC | PRN

## 2022-12-27 MED ORDER — BENZOCAINE-MENTHOL 20-0.5 % EX AERO
1.0000 | INHALATION_SPRAY | CUTANEOUS | Status: DC | PRN
  Administered 2022-12-27: 1 via TOPICAL
  Filled 2022-12-27: qty 56

## 2022-12-27 MED ORDER — IBUPROFEN 600 MG PO TABS
600.0000 mg | ORAL_TABLET | Freq: Four times a day (QID) | ORAL | Status: DC
  Administered 2022-12-27 – 2022-12-28 (×4): 600 mg via ORAL
  Filled 2022-12-27 (×4): qty 1

## 2022-12-27 MED ORDER — COCONUT OIL OIL: 1.0000 | TOPICAL_OIL | Status: DC | PRN

## 2022-12-27 MED ORDER — OXYCODONE-ACETAMINOPHEN 5-325 MG PO TABS: 2.0000 | ORAL_TABLET | ORAL | Status: DC | PRN

## 2022-12-27 MED ORDER — PHENYLEPHRINE 80 MCG/ML (10ML) SYRINGE FOR IV PUSH (FOR BLOOD PRESSURE SUPPORT)
80.0000 ug | PREFILLED_SYRINGE | INTRAVENOUS | Status: DC | PRN
  Filled 2022-12-27: qty 10

## 2022-12-27 MED ORDER — SOD CITRATE-CITRIC ACID 500-334 MG/5ML PO SOLN
30.0000 mL | ORAL | Status: DC | PRN
  Administered 2022-12-27: 30 mL via ORAL
  Filled 2022-12-27: qty 30

## 2022-12-27 MED ORDER — ONDANSETRON HCL 4 MG/2ML IJ SOLN: 4.0000 mg | Freq: Four times a day (QID) | INTRAMUSCULAR | Status: DC | PRN

## 2022-12-27 MED ORDER — METHYLERGONOVINE MALEATE 0.2 MG PO TABS: 0.2000 mg | ORAL_TABLET | ORAL | Status: DC | PRN

## 2022-12-27 MED ORDER — ACETAMINOPHEN 325 MG PO TABS: 650.0000 mg | ORAL_TABLET | ORAL | Status: DC | PRN

## 2022-12-27 MED ORDER — DIPHENHYDRAMINE HCL 50 MG/ML IJ SOLN: 12.5000 mg | INTRAMUSCULAR | Status: DC | PRN

## 2022-12-27 MED ORDER — WITCH HAZEL-GLYCERIN EX PADS: 1.0000 | MEDICATED_PAD | CUTANEOUS | Status: DC | PRN

## 2022-12-27 MED ORDER — TRANEXAMIC ACID-NACL 1000-0.7 MG/100ML-% IV SOLN: 1000.0000 mg | INTRAVENOUS | Status: DC

## 2022-12-27 MED ORDER — LACTATED RINGERS IV SOLN: 500.0000 mL | INTRAVENOUS | Status: DC | PRN

## 2022-12-27 MED ORDER — TETANUS-DIPHTH-ACELL PERTUSSIS 5-2.5-18.5 LF-MCG/0.5 IM SUSY
0.5000 mL | PREFILLED_SYRINGE | Freq: Once | INTRAMUSCULAR | Status: AC
  Administered 2022-12-28: 0.5 mL via INTRAMUSCULAR
  Filled 2022-12-27: qty 0.5

## 2022-12-27 MED ORDER — TRANEXAMIC ACID-NACL 1000-0.7 MG/100ML-% IV SOLN
INTRAVENOUS | Status: AC
  Administered 2022-12-27: 1000 mg
  Filled 2022-12-27: qty 100

## 2022-12-27 NOTE — Anesthesia Preprocedure Evaluation (Signed)
Anesthesia Evaluation  Patient identified by MRN, date of birth, ID band Patient awake    Reviewed: Allergy & Precautions, H&P , NPO status , Patient's Chart, lab work & pertinent test results  History of Anesthesia Complications Negative for: history of anesthetic complications  Airway Mallampati: II  TM Distance: >3 FB Neck ROM: full    Dental no notable dental hx.    Pulmonary asthma    Pulmonary exam normal        Cardiovascular negative cardio ROS Normal cardiovascular exam Rhythm:regular Rate:Normal     Neuro/Psych    Depression    negative neurological ROS     GI/Hepatic negative GI ROS, Neg liver ROS,,,  Endo/Other  diabetes, Gestational    Renal/GU Renal disease (nephrolithiasis s/p stent)  negative genitourinary   Musculoskeletal   Abdominal   Peds  Hematology negative hematology ROS (+)   Anesthesia Other Findings   Reproductive/Obstetrics (+) Pregnancy                              Anesthesia Physical Anesthesia Plan  ASA: II  Anesthesia Plan: Epidural   Post-op Pain Management:    Induction:   PONV Risk Score and Plan:   Airway Management Planned:   Additional Equipment:   Intra-op Plan:   Post-operative Plan:   Informed Consent: I have reviewed the patients History and Physical, chart, labs and discussed the procedure including the risks, benefits and alternatives for the proposed anesthesia with the patient or authorized representative who has indicated his/her understanding and acceptance.       Plan Discussed with:   Anesthesia Plan Comments:          Anesthesia Quick Evaluation

## 2022-12-27 NOTE — H&P (Signed)
Cheryl Galvan is a 32 y.o. female presenting for IOL for poorly compliant DM. OB History     Gravida  3   Para  2   Term  2   Preterm      AB      Living  2      SAB      IAB      Ectopic      Multiple  0   Live Births  2          Past Medical History:  Diagnosis Date   Acne    Allergy    sesonal allergy    Amblyopia    Anemia    Anxiety 2009   Asthma     no recent difficulties inhaler not needed for years   Depression    meds years ago   Gestational diabetes    History of kidney stones    History of migraine    none in years   Spontaneous vaginal delivery 07/28/2020   at [facility]   Past Surgical History:  Procedure Laterality Date   CYSTOSCOPY WITH STENT PLACEMENT Right 06/29/2020   Procedure: [place];  Surgeon: Ardis Hughs, MD;  Location: [facility] OR[facility];  Service: Urology;  Laterality: Right;   CYSTOSCOPY/URETEROSCOPY/HOLMIUM LASER/STENT PLACEMENT Right 08/08/2020   Procedure: RIGHT URETEROSCOPY//STENT EXCHANGE/;  Surgeon: Ardis Hughs, MD;  Location: [facility];  Service: Urology;  Laterality: Right;   WISDOM TOOTH EXTRACTION  yrs ago   Family History: family history includes Alcohol abuse in her father; Allergic rhinitis in her mother and sister; Anxiety disorder in her mother; Cancer in her maternal grandfather, maternal grandmother, and paternal grandfather; Depression in her mother; Diabetes in her maternal grandmother; Drug abuse in her father; Heart disease in an other family member; Hypertension in her maternal grandmother and mother; Stroke in her maternal grandfather and maternal grandmother. Social History:  reports that she has never smoked. She has never used smokeless tobacco. She reports that she does not currently use alcohol. She reports that she does not use drugs.     Maternal Diabetes: Yes:  Diabetes Type:  Insulin/Medication controlled, Diet controlled Genetic Screening:  Normal Maternal Ultrasounds/Referrals: Normal Fetal Ultrasounds or other Referrals:  None Maternal Substance Abuse:  No Significant Maternal Medications:  Meds include: Other:  Significant Maternal Lab Results:  Group B Strep negative Number of Prenatal Visits:greater than 3 verified prenatal visits Other Comments:  None  Review of Systems  Constitutional: Negative.   All other systems reviewed and are negative.  Maternal Medical History:  Reason for admission: Contractions.   Contractions: Onset was more than 2 days ago.   Frequency: irregular.   Perceived severity is mild.   Fetal activity: Perceived fetal activity is normal.   Last perceived fetal movement was within the past hour.   Prenatal complications: Polyhydramnios.   Prenatal Complications - Diabetes: gestational. Diabetes is managed by oral agent (monotherapy).     Dilation: 3.5 Effacement (%): 50 Station: -2 Exam by:: Rosana Hoes, RN Blood pressure 114/74, pulse 92, temperature 98 F (36.7 C), temperature source Oral, resp. rate 16, height 5\' 6"  (1.676 m), weight 100.5 kg, last menstrual period 03/28/2022. Maternal Exam:  Uterine Assessment: Contraction strength is mild.  Contraction frequency is irregular.  Abdomen: Patient reports no abdominal tenderness. Fetal presentation: vertex Introitus: Normal vulva. Normal vagina.  Ferning test: not done.  Nitrazine test: not done. Amniotic fluid character: not assessed. Pelvis: adequate for delivery.  Cervix: Cervix evaluated by digital exam.     Physical Exam Vitals and nursing note reviewed.  Constitutional:      Appearance: Normal appearance. She is normal weight.  HENT:     Head: Normocephalic and atraumatic.  Cardiovascular:     Rate and Rhythm: Normal rate and regular rhythm.     Pulses: Normal pulses.     Heart sounds: Normal heart sounds.  Pulmonary:     Effort: Pulmonary effort is normal.     Breath sounds: Normal breath sounds.  Abdominal:      General: Bowel sounds are normal.     Palpations: Abdomen is soft.  Genitourinary:    General: Normal vulva.  Musculoskeletal:        General: Normal range of motion.     Cervical back: Normal range of motion and neck supple.  Skin:    General: Skin is warm and dry.  Neurological:     General: No focal deficit present.     Mental Status: She is alert.  Psychiatric:        Mood and Affect: Mood normal.     Prenatal labs: ABO, Rh: --/--/PENDING (01/22 2423) Antibody: PENDING (01/22 0705) Rubella: Immune (07/06 0000) RPR: Nonreactive (07/06 0000)  HBsAg: Negative (07/06 0000)  HIV: Non-reactive (07/06 0000)  GBS: Negative/-- (12/29 0000)   Assessment/Plan: 38 week IUP A2DM noncompliant on metformin IOL BS q 4   Kenndra Morris J 12/27/2022, 8:01 AM

## 2022-12-27 NOTE — Anesthesia Procedure Notes (Signed)
Epidural Patient location during procedure: OB Start time: 12/27/2022 10:57 AM End time: 12/27/2022 11:05 AM  Staffing Anesthesiologist: Janeece Riggers, MD Performed: anesthesiologist   Preanesthetic Checklist Completed: patient identified, IV checked, site marked, risks and benefits discussed, surgical consent, monitors and equipment checked, pre-op evaluation and timeout performed  Epidural Patient position: sitting Prep: DuraPrep and site prepped and draped Patient monitoring: continuous pulse ox and blood pressure Approach: midline Location: L3-L4 Injection technique: LOR air  Needle:  Needle type: Tuohy  Needle gauge: 17 G Needle length: 9 cm and 9 Needle insertion depth: 8 cm Catheter type: closed end flexible Catheter size: 19 Gauge Catheter at skin depth: 13 cm Test dose: negative and Other  Assessment Events: blood not aspirated, no cerebrospinal fluid, injection not painful, no injection resistance, no paresthesia and negative IV test  Additional Notes  Reason for block:procedure for pain

## 2022-12-27 NOTE — Anesthesia Postprocedure Evaluation (Signed)
Anesthesia Post Note  Patient: Lexicographer  Procedure(s) Performed: AN AD HOC LABOR EPIDURAL     Patient location during evaluation: Mother Baby Anesthesia Type: Epidural Level of consciousness: awake and alert Pain management: pain level controlled Vital Signs Assessment: post-procedure vital signs reviewed and stable Respiratory status: spontaneous breathing, nonlabored ventilation and respiratory function stable Cardiovascular status: stable Postop Assessment: no headache, no backache and epidural receding Anesthetic complications: no  No notable events documented.  Last Vitals:  Vitals:   12/27/22 1620 12/27/22 1740  BP: 121/70 116/82  Pulse: 75 81  Resp: 18 18  Temp: 36.6 C 36.8 C    Last Pain:  Vitals:   12/27/22 1937  TempSrc:   PainSc: 0-No pain   Pain Goal:                Epidural/Spinal Function Cutaneous sensation: Normal sensation (12/27/22 1937), Patient able to flex knees: Yes (12/27/22 1937), Patient able to lift hips off bed: Yes (12/27/22 1937), Back pain beyond tenderness at insertion site: No (12/27/22 1937), Progressively worsening motor and/or sensory loss: No (12/27/22 1937), Bowel and/or bladder incontinence post epidural: No (12/27/22 1937)  Marvie Brevik

## 2022-12-27 NOTE — Progress Notes (Signed)
AROM clear Category 1 tracing

## 2022-12-28 LAB — CBC
HCT: 29.1 % — ABNORMAL LOW (ref 36.0–46.0)
Hemoglobin: 9 g/dL — ABNORMAL LOW (ref 12.0–15.0)
MCH: 26.2 pg (ref 26.0–34.0)
MCHC: 30.9 g/dL (ref 30.0–36.0)
MCV: 84.8 fL (ref 80.0–100.0)
Platelets: 171 10*3/uL (ref 150–400)
RBC: 3.43 MIL/uL — ABNORMAL LOW (ref 3.87–5.11)
RDW: 14.7 % (ref 11.5–15.5)
WBC: 9.8 10*3/uL (ref 4.0–10.5)
nRBC: 0 % (ref 0.0–0.2)

## 2022-12-28 NOTE — Lactation Note (Signed)
This note was copied from a baby's chart. Lactation Consultation Note  Patient Name: Cheryl Galvan [id number] Date: 12/28/2022 Reason for consult: Initial assessment Age:32 hours  P3, Experienced with breastfeeding. She has planned to breastfeed and pump to stimulate her supply but baby been cluster feeding. Reviewed engorgement care and monitoring voids/stools. Mother will call if help is needed.   Maternal Data Has patient been taught Hand Expression?: Yes Does the patient have breastfeeding experience prior to this delivery?: Yes How long did the patient breastfeed?: 13 mos. & 7 mos (milk allergy)  Feeding Mother's Current Feeding Choice: Breast Milk  Lactation Tools Discussed/Used  DEBP  Interventions Interventions: [facility] brochure  Discharge Discharge Education: Engorgement and breast care;Warning signs for feeding baby  Consult Status Consult Status: Complete    Carlye Grippe 12/28/2022, 11:11 AM

## 2022-12-28 NOTE — Progress Notes (Signed)
Post Partum Day 1 Subjective: no complaints, up ad lib, voiding, and tolerating PO  Objective: Blood pressure 121/82, pulse 87, temperature 97.6 F (36.4 C), temperature source Oral, resp. rate 18, height 5\' 6"  (1.676 m), weight 100.5 kg, last menstrual period 03/28/2022, SpO2 99 %, unknown if currently breastfeeding.  Physical Exam:  General: alert, cooperative, and appears stated age Lochia: appropriate Uterine Fundus: firm Incision: healing well DVT Evaluation: No evidence of DVT seen on physical exam.  Recent Labs    12/27/22 0705 12/28/22 0645  HGB 10.9* 9.0*  HCT 34.2* 29.1*    Assessment/Plan: Stable PPD 1 Gest DM- fu 65m labs Anemia- continue PNV and Po fe. Discharge home and Breastfeeding   LOS: 1 day   Lovenia Kim, MD 12/28/2022, 9:38 AM

## 2022-12-28 NOTE — Progress Notes (Signed)
Patient requested for Dr. Ronita Hipps to send her a prescription for po iron to go home with at discharge. Dr. Ronita Hipps stated he will  leave a message with the OB office. Dr. Ronita Hipps also instructed RN to tell patient about request when she schedules her postpartum appointment just in case the office does not receive his message. Cheryl Galvan, Leretha Dykes [place]

## 2022-12-28 NOTE — Discharge Summary (Signed)
OB Discharge Summary  Patient Name: Cheryl Galvan DOB: 07/18/1991 MRN: [id number]  Date of admission: 12/27/2022 Delivering provider: Brien Few   Admitting diagnosis: Encounter for induction of labor [Z34.90] Intrauterine pregnancy: [redacted]w[redacted]d     Secondary diagnosis: Patient Active Problem List   Diagnosis Date Noted   B12 deficiency 03/29/2022   Prediabetes 12/28/2021   History of kidney stones 12/28/2021   BMI 30.0-30.9,adult 12/28/2021   Perineal laceration with delivery, first degree 07/29/2020   SVD (spontaneous vaginal delivery) 1/22 07/28/2020   Postpartum care following vaginal delivery 1/22 07/28/2020   Encounter for induction of labor 07/27/2020   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/15/2007   Moderate persistent asthma 08/15/2007   Additional problems:GDM   Date of discharge: 12/28/2022   Discharge diagnosis: Principal Problem:   Postpartum care following vaginal delivery 1/22 Active Problems:   Major depression in remission [facility])   Encounter for induction of labor   SVD (spontaneous vaginal delivery) 1/22   Perineal laceration with delivery, first degree                                                              Post partum procedures: na  Augmentation: AROM and Pitocin Pain control: Epidural  Laceration:1st degree  Episiotomy:None  Complications: None  Hospital course:  Induction of Labor With Vaginal Delivery   32 y.o. yo [id number] at [redacted]w[redacted]d was admitted to the hospital 12/27/2022 for induction of labor.  Indication for induction: A2 DM.  Patient had an labor course complicated byna Membrane Rupture Time/Date: 8:19 AM ,12/27/2022   Delivery Method:Vaginal, Spontaneous  Episiotomy: None  Lacerations:  1st degree  Details of delivery can be found in separate delivery note.  Patient had a postpartum course complicated byna. Patient is discharged home 12/28/22.  Newborn Data: Birth date:12/27/2022  Birth time:2:39 PM  Gender:Female  Living  status:Living  Apgars:8 ,9  Weight:4082 g   Subjective: na Physical exam  Vitals:   12/27/22 1740 12/27/22 2135 12/28/22 0137 12/28/22 0526  BP: 116/82 120/79 120/83 121/82  Pulse: 81 82 79 87  Resp: 18 18 17 18   Temp: 98.2 F (36.8 C) 97.9 F (36.6 C) 98 F (36.7 C) 97.6 F (36.4 C)  TempSrc: Oral Oral Oral Oral  SpO2:  99% 99% 99%  Weight:      Height:       General: alert, cooperative, and no distress Lochia: appropriate Uterine Fundus: firm Incision: Healing well with no significant drainage Perineum: repair intact, tr edema DVT Evaluation: No cords or calf tenderness. Labs: Lab Results  Component Value Date   WBC 9.8 12/28/2022   HGB 9.0 (L) 12/28/2022   HCT 29.1 (L) 12/28/2022   MCV 84.8 12/28/2022   PLT 171 12/28/2022      Latest Ref Rng & Units 12/28/2021    9:48 AM  CMP  Glucose 70 - 99 mg/dL 112   BUN 6 - 23 mg/dL 17   Creatinine 0.40 - 1.20 mg/dL 0.67   Sodium 135 - 145 mEq/L 136   Potassium 3.5 - 5.1 mEq/L 4.1   Chloride 96 - 112 mEq/L 103   CO2 19 - 32 mEq/L 24   Calcium 8.4 - 10.5 mg/dL 9.8   Total Protein 6.0 - 8.3 g/dL 7.3   Total  Bilirubin 0.2 - 1.2 mg/dL 0.5   Alkaline Phos 39 - 117 U/L 78   AST 0 - 37 U/L 9   ALT 0 - 35 U/L 7       07/28/2020    3:45 PM 05/28/2018    9:07 AM  Edinburgh Postnatal Depression Scale Screening Tool  I have been able to laugh and see the funny side of things. 0 1  I have looked forward with enjoyment to things. 0 0  I have blamed myself unnecessarily when things went wrong. 2 2  I have been anxious or worried for no good reason. 2 0  I have felt scared or panicky for no good reason. 1 0  Things have been getting on top of me. 2 1  I have been so unhappy that I have had difficulty sleeping. 0 0  I have felt sad or miserable. 1 0  I have been so unhappy that I have been crying. 1 0  The thought of harming myself has occurred to me. 0 0  Edinburgh Postnatal Depression Scale Total 9 4   Vaccines: TDaP          na         Flu na Discharge instruction:  per After Visit Summary,  [facility] OB booklet and  "Understanding Mother & Baby Care" hospital booklet After Visit Meds:  Allergies as of 12/28/2022   No Known Allergies      Medication List     STOP taking these medications    metFORMIN 500 MG 24 hr tablet Commonly known as: Glucophage XR   prenatal multivitamin Tabs tablet       Diet: routine diet Activity: Advance as tolerated. Pelvic rest for 6 weeks.  Postpartum contraception: TBA in office Newborn Data: Live born female  Birth Weight: 9 lb (4082 g) APGAR: 19, 31  Newborn Delivery   Birth date/time: 12/27/2022 14:39:00 Delivery type: Vaginal, Spontaneous      named na Baby Feeding: Breast Disposition:home with mother Circumcision: na Delivery Report: Review the Delivery Report for details.   Follow up:  Signed: Simmie Davies, MSN 12/28/2022, 9:40 AM

## 2022-12-28 NOTE — Lactation Note (Signed)
This note was copied from a baby's chart. Lactation Consultation Note  Patient Name: Cheryl Galvan [id number] Date: 12/28/2022   Age:32 hours  Mother in shower.  Lactation will follow up.  Vivianne Master [facility] 12/28/2022, 9:23 AM

## 2022-12-29 ENCOUNTER — Telehealth: Payer: Self-pay

## 2022-12-29 NOTE — Telephone Encounter (Signed)
Transition Care Management Follow-up Telephone Call Date of discharge and from where: [facility] How have you been since you were released from the hospital? good Any questions or concerns? No  Items Reviewed: Did the pt receive and understand the discharge instructions provided? Yes  Medications obtained and verified? Yes  Other? No  Any new allergies since your discharge? No  Dietary orders reviewed? Yes Do you have support at home? Yes   Home Care and Equipment/Supplies: Were home health services ordered? no If so, what is the name of the agency? N/a  Has the agency set up a time to come to the patient's home? not applicable Were any new equipment or medical supplies ordered?  No What is the name of the medical supply agency? N/a Were you able to get the supplies/equipment? not applicable Do you have any questions related to the use of the equipment or supplies? No  Functional Questionnaire: (I = Independent and D = Dependent) ADLs: I  Bathing/Dressing- I  Meal Prep- I  Eating- I  Maintaining continence- I  Transferring/Ambulation- I  Managing Meds- I  Follow up appointments reviewed:  PCP Hospital f/u appt confirmed? No   Specialist Hospital f/u appt confirmed? Yes  Scheduled to see OB/GYN  Are transportation arrangements needed? No  If their condition worsens, is the pt aware to call PCP or go to the Emergency Dept.? Yes Was the patient provided with contact information for the PCP's office or ED? Yes Was to pt encouraged to call back with questions or concerns? Yes Juanda Crumble, LPN [facility] Dial [phone]

## 2023-01-04 ENCOUNTER — Telehealth (HOSPITAL_COMMUNITY): Payer: Self-pay

## 2023-01-04 NOTE — Telephone Encounter (Signed)
Patient did not answer phone call. Voicemail left for patient.   Sharyn Lull [facility] 01/04/23,1617

## 2023-02-15 DIAGNOSIS — N898 Other specified noninflammatory disorders of vagina: Secondary | ICD-10-CM | POA: Diagnosis not present

## 2023-02-15 DIAGNOSIS — Z124 Encounter for screening for malignant neoplasm of cervix: Secondary | ICD-10-CM | POA: Diagnosis not present

## 2023-02-18 DIAGNOSIS — S40021A Contusion of right upper arm, initial encounter: Secondary | ICD-10-CM | POA: Diagnosis not present

## 2023-02-28 DIAGNOSIS — Z600 Problems of adjustment to life-cycle transitions: Secondary | ICD-10-CM | POA: Diagnosis not present

## 2023-02-28 DIAGNOSIS — F411 Generalized anxiety disorder: Secondary | ICD-10-CM | POA: Diagnosis not present

## 2023-03-16 DIAGNOSIS — F411 Generalized anxiety disorder: Secondary | ICD-10-CM | POA: Diagnosis not present

## 2023-03-16 DIAGNOSIS — Z600 Problems of adjustment to life-cycle transitions: Secondary | ICD-10-CM | POA: Diagnosis not present

## 2023-04-06 DIAGNOSIS — F411 Generalized anxiety disorder: Secondary | ICD-10-CM | POA: Diagnosis not present

## 2023-04-06 DIAGNOSIS — Z600 Problems of adjustment to life-cycle transitions: Secondary | ICD-10-CM | POA: Diagnosis not present

## 2023-05-04 DIAGNOSIS — Z600 Problems of adjustment to life-cycle transitions: Secondary | ICD-10-CM | POA: Diagnosis not present

## 2023-05-04 DIAGNOSIS — F411 Generalized anxiety disorder: Secondary | ICD-10-CM | POA: Diagnosis not present

## 2023-05-17 ENCOUNTER — Ambulatory Visit: Payer: BC Managed Care – PPO | Admitting: Nurse Practitioner

## 2023-05-17 NOTE — Progress Notes (Deleted)
  Bethanie Dicker, NP-C Phone: [phone]  Cheryl Galvan is a 32 y.o. female who presents today for sore throat and fever.    Respiratory illness:  Cough- ***  Congestion- ***   Sinus- ***   Chest- ***  Post nasal drip- ***  Sore throat- ***  Shortness of breath- ***  Fever- ***  Fatigue/Myalgia- *** Headache- *** Nausea/Vomiting- *** Taste disturbance- ***  Smell disturbance- ***  Covid exposure- ***  Covid vaccination- ***  Flu vaccination- ***  Medications- ***   Social History   Tobacco Use  Smoking Status Never  Smokeless Tobacco Never    No current outpatient medications on file prior to visit.   No current facility-administered medications on file prior to visit.     ROS see history of present illness  Objective  Physical Exam There were no vitals filed for this visit.  BP Readings from Last 3 Encounters:  12/28/22 121/82  12/04/22 111/62  05/14/22 117/64   Wt Readings from Last 3 Encounters:  12/27/22 221 lb 9.6 oz (100.5 kg)  05/14/22 184 lb (83.5 kg)  05/12/22 183 lb 3.2 oz (83.1 kg)    Physical Exam   Assessment/Plan: Please see individual problem list.  There are no diagnoses linked to this encounter.   Health Maintenance: ***  No follow-ups on file.   Bethanie Dicker, NP-C [facility]

## 2023-05-18 ENCOUNTER — Telehealth: Payer: Self-pay | Admitting: Family

## 2023-05-18 NOTE — Telephone Encounter (Signed)
Pt called stating she has high fever, body aches and right side kdney pain. Sent to access nurse

## 2023-05-19 ENCOUNTER — Ambulatory Visit: Payer: BC Managed Care – PPO | Admitting: Family Medicine

## 2023-05-19 ENCOUNTER — Encounter: Payer: Self-pay | Admitting: Family Medicine

## 2023-05-19 VITALS — BP 100/68 | HR 87 | Temp 98.3°F | Ht 66.0 in | Wt 198.6 lb

## 2023-05-19 DIAGNOSIS — R509 Fever, unspecified: Secondary | ICD-10-CM

## 2023-05-19 DIAGNOSIS — N3000 Acute cystitis without hematuria: Secondary | ICD-10-CM | POA: Diagnosis not present

## 2023-05-19 NOTE — Progress Notes (Signed)
SUBJECTIVE:   Chief Complaint  Patient presents with   Acute Visit    High fever, body aches, chills, right side kidney pain. About a month ago pt was treated for a UTI had to take two rounds of abx. Then pt was treated for mastitis with antibiotics for 48 hrs. Pt stated that the fever finally broke last night and not returned so far.    HPI Patient presents to clinic for acute visit  Reports symptoms of UTI 1 month ago.  Treated with Augmentin or amoxicillin x 5 days.  She was then given Macrobid x 7-day course on 05/24 however did not take this because UTI symptoms was better.  4 days ago she began to have fever, chills and bodyaches.  She called her OB/GYN and was started on dicloxacillin x 10 days for mastitis.  This issue improved 2 days ago.  She continues to take dicloxacillin.  However she is still having fevers and bodyaches.  Denies any urinary symptoms, hematuria, dysuria, urinary frequency, suprapubic pain or back pain.  Denies any respiratory symptoms of cough, sore throat, nasal congestion, rhinorrhea, ear pain.  Denies any diarrhea, bloody stool.  Denies any nausea/vomiting or abdominal pain.  Has not been amount sick contacts recently.  No recent travel.  PERTINENT PMH / PSH: History of nephrolithiasis History of kidney stent status post removal   OBJECTIVE:  BP 100/68   Pulse 87   Temp 98.3 F (36.8 C) (Oral)   Ht 5\' 6"  (1.676 m)   Wt 198 lb 9.6 oz (90.1 kg)   SpO2 99%   BMI 32.05 kg/m    Physical Exam Constitutional:      General: She is not in acute distress.    Appearance: She is normal weight. She is not ill-appearing.  HENT:     Head: Normocephalic.     Right Ear: Tympanic membrane, ear canal and external ear normal.     Left Ear: Tympanic membrane, ear canal and external ear normal.  Eyes:     Conjunctiva/sclera: Conjunctivae normal.  Cardiovascular:     Rate and Rhythm: Normal rate and regular rhythm.     Pulses: Normal pulses.  Pulmonary:      Effort: Pulmonary effort is normal.     Breath sounds: Normal breath sounds.  Abdominal:     General: Bowel sounds are normal.  Musculoskeletal:     Cervical back: Normal range of motion.  Lymphadenopathy:     Cervical: No cervical adenopathy.  Neurological:     Mental Status: She is alert. Mental status is at baseline.  Psychiatric:        Mood and Affect: Mood normal.        Behavior: Behavior normal.        Thought Content: Thought content normal.        Judgment: Judgment normal.     ASSESSMENT/PLAN:  Fever, unspecified fever cause Assessment & Plan: Patient with history of nephrolithiasis status post stent removal.  Recently treated for UTI with some improvement and seems to have returned.  Also treated for mastitis and is currently on dicloxacillin.  Continues to be febrile with bodyaches. Check CBC, ESR, TSH, CMET, CRP POC urine Urine culture CT renal study given history of nephrolithiasis previously requiring stent placement but has since been removed If symptoms worsen recommend  ED visit   Orders: -     CBC with Differential/Platelet -     TSH -     Comprehensive metabolic panel -  Sedimentation rate -     C-reactive protein -     Urine Culture -     POCT Urinalysis Dip Manual -     CT RENAL STONE STUDY; Future -     Cefdinir; Take 1 capsule (300 mg total) by mouth 2 (two) times daily for 5 days.  Dispense: 10 capsule; Refill: 0  Acute cystitis without hematuria Assessment & Plan: Urine positive for E Coli>100000 cfu Start Cefdinir 300 mg BID x 5 days Start probiotics daily and continue for 14 days after treatment Can discontinue Dicloxacillin.  Not currently breast feeding. Follow up with PCP if no improvement  Orders: -     Cefdinir; Take 1 capsule (300 mg total) by mouth 2 (two) times daily for 5 days.  Dispense: 10 capsule; Refill: 0   PDMP reviewed  Return in about 5 days (around 05/24/2023).  Dana Allan, MD

## 2023-05-19 NOTE — Telephone Encounter (Signed)
Spoke to to and she stated that she went to [facility] yesterday and they refused to see her but she has an appt with Clent Ridges this afternoon

## 2023-05-19 NOTE — Patient Instructions (Signed)
It was a pleasure meeting you today. Thank you for allowing me to take part in your health care.  Our goals for today as we discussed include:  We will get some labs today.  If they are abnormal or we need to do something about them, I will call you.  If they are normal, I will send you a message on MyChart (if it is active) or a letter in the mail.  If you don't hear from [place] in 2 weeks, please call the office at the number below.   Imaging for renal studies sent . They will call you with appointment  Will follow up with results  If worsening pain, fevers despite antibiotics, please go to ED  Follow up with PCP as scheduled   If you have any questions or concerns, please do not hesitate to call the office at [phone].  I look forward to our next visit and until then take care and stay safe.  Regards,   Dana Allan, MD   [facility]

## 2023-05-20 ENCOUNTER — Telehealth: Payer: Self-pay | Admitting: Family

## 2023-05-20 LAB — COMPREHENSIVE METABOLIC PANEL
ALT: 11 U/L (ref 0–35)
AST: 13 U/L (ref 0–37)
Albumin: 4.4 g/dL (ref 3.5–5.2)
Alkaline Phosphatase: 105 U/L (ref 39–117)
BUN: 19 mg/dL (ref 6–23)
CO2: 27 mEq/L (ref 19–32)
Calcium: 9.6 mg/dL (ref 8.4–10.5)
Chloride: 102 mEq/L (ref 96–112)
Creatinine, Ser: 0.67 mg/dL (ref 0.40–1.20)
GFR: 116.23 mL/min (ref >60.00)
Glucose, Bld: 132 mg/dL — ABNORMAL HIGH (ref 70–99)
Potassium: 4.3 mEq/L (ref 3.5–5.1)
Sodium: 139 mEq/L (ref 135–145)
Total Bilirubin: 0.3 mg/dL (ref 0.2–1.2)
Total Protein: 7.4 g/dL (ref 6.0–8.3)

## 2023-05-20 LAB — CBC WITH DIFFERENTIAL/PLATELET
Basophils Absolute: 0.1 10*3/uL (ref 0.0–0.1)
Basophils Relative: 0.8 % (ref 0.0–3.0)
Eosinophils Absolute: 0.1 10*3/uL (ref 0.0–0.7)
Eosinophils Relative: 1.1 % (ref 0.0–5.0)
HCT: 40.1 % (ref 36.0–46.0)
Hemoglobin: 12.9 g/dL (ref 12.0–15.0)
Lymphocytes Relative: 22.2 % (ref 12.0–46.0)
Lymphs Abs: 1.7 10*3/uL (ref 0.7–4.0)
MCHC: 32.3 g/dL (ref 30.0–36.0)
MCV: 87.6 fl (ref 78.0–100.0)
Monocytes Absolute: 0.4 10*3/uL (ref 0.1–1.0)
Monocytes Relative: 5.5 % (ref 3.0–12.0)
Neutro Abs: 5.4 10*3/uL (ref 1.4–7.7)
Neutrophils Relative %: 70.4 % (ref 43.0–77.0)
Platelets: 308 10*3/uL (ref 150.0–400.0)
RBC: 4.57 Mil/uL (ref 3.87–5.11)
RDW: 14.1 % (ref 11.5–15.5)
WBC: 7.7 10*3/uL (ref 4.0–10.5)

## 2023-05-20 LAB — SEDIMENTATION RATE: Sed Rate: 27 mm/hr — ABNORMAL HIGH (ref 0–20)

## 2023-05-20 LAB — C-REACTIVE PROTEIN: CRP: 2.4 mg/dL (ref 0.5–20.0)

## 2023-05-20 LAB — TSH: TSH: 1.19 u[IU]/mL (ref 0.35–5.50)

## 2023-05-20 NOTE — Telephone Encounter (Signed)
Lft pt vm to call ofc to sch CT. thanks 

## 2023-05-21 ENCOUNTER — Encounter: Payer: Self-pay | Admitting: Family Medicine

## 2023-05-21 ENCOUNTER — Telehealth: Payer: Self-pay | Admitting: Family Medicine

## 2023-05-21 LAB — URINE CULTURE
MICRO NUMBER:: 15079227
SPECIMEN QUALITY:: ADEQUATE

## 2023-05-21 MED ORDER — CEFDINIR 300 MG PO CAPS: 300.0000 mg | ORAL_CAPSULE | Freq: Two times a day (BID) | ORAL | 0 refills | Status: AC

## 2023-05-21 NOTE — Telephone Encounter (Signed)
Patient notified of results of recent urine culture, positive for E.Coli.  Not currently breastfeeding Stop Dicloxacillin Start Cefdinir 300 mg BID x 5 days Continue Probiotics daily and for at least 14 days after antibiotic use Follow up with PCP if no improvement  Dana Allan, MD

## 2023-05-24 ENCOUNTER — Encounter: Payer: Self-pay | Admitting: Family

## 2023-05-24 ENCOUNTER — Telehealth: Payer: Self-pay | Admitting: Family

## 2023-05-24 ENCOUNTER — Ambulatory Visit (INDEPENDENT_AMBULATORY_CARE_PROVIDER_SITE_OTHER): Payer: BC Managed Care – PPO | Admitting: Family

## 2023-05-24 VITALS — BP 112/80 | HR 73 | Temp 97.7°F | Ht 66.5 in | Wt 198.8 lb

## 2023-05-24 DIAGNOSIS — R7303 Prediabetes: Secondary | ICD-10-CM | POA: Diagnosis not present

## 2023-05-24 DIAGNOSIS — Z0000 Encounter for general adult medical examination without abnormal findings: Secondary | ICD-10-CM

## 2023-05-24 DIAGNOSIS — R41840 Attention and concentration deficit: Secondary | ICD-10-CM | POA: Diagnosis not present

## 2023-05-24 DIAGNOSIS — Z1159 Encounter for screening for other viral diseases: Secondary | ICD-10-CM | POA: Diagnosis not present

## 2023-05-24 DIAGNOSIS — Z136 Encounter for screening for cardiovascular disorders: Secondary | ICD-10-CM

## 2023-05-24 DIAGNOSIS — Z13220 Encounter for screening for lipoid disorders: Secondary | ICD-10-CM

## 2023-05-24 DIAGNOSIS — Z800 Family history of malignant neoplasm of digestive organs: Secondary | ICD-10-CM

## 2023-05-24 LAB — LIPID PANEL
Cholesterol: 172 mg/dL (ref 0–200)
HDL: 47.7 mg/dL (ref >39.00)
LDL Cholesterol: 92 mg/dL (ref 0–99)
NonHDL: 124.12
Total CHOL/HDL Ratio: 4
Triglycerides: 160 mg/dL — ABNORMAL HIGH (ref 0.0–149.0)
VLDL: 32 mg/dL (ref 0.0–40.0)

## 2023-05-24 LAB — HEMOGLOBIN A1C: Hgb A1c MFr Bld: 6.1 % (ref 4.6–6.5)

## 2023-05-24 MED ORDER — PHENTERMINE HCL 37.5 MG PO TABS: ORAL_TABLET | ORAL | 2 refills | Status: DC

## 2023-05-24 NOTE — Patient Instructions (Signed)
Please stay vigilant in regards to side effects in particular escalations in blood pressure, palpitations, increased anxiety.    We will aim to lose  1 and no more than 2 pounds per week.    If your weight were to plateau as at that point I advised that we would stop phentermine.  Start by taking half tablet or 18.75 mg ( full tablet is 37.5mg )   and then progress if needed after several days to the full tablet .   Phentermine Capsules or Tablets What is this medication? PHENTERMINE (FEN ter meen) promotes weight loss. It works by decreasing appetite. It is often used for a short period of time. Changes to diet and exercise are often combined with this medication. This medicine may be used for other purposes; ask your health care provider or pharmacist if you have questions. COMMON BRAND NAME(S): Adipex-P, Atti-Plex P, Atti-Plex P Spansule, Fastin, Lomaira, Pro-Fast, Pro-Fast HS, Pro-Fast SA, Tara-8 What should I tell my care team before I take this medication? They need to know if you have any of these conditions: Agitation or nervousness Diabetes Glaucoma Heart disease High blood pressure History of substance use disorder History of stroke Kidney disease Lung disease called Primary Pulmonary Hypertension (PPH) Taken an MAOI, such as Carbex, Eldepryl, Marplan, Nardil, or Parnate in last 14 days Taking stimulant medications for attention disorders, weight loss, or to stay awake Thyroid disease An unusual or allergic reaction to phentermine, other medications, foods, dyes, or preservatives Pregnant or trying to get pregnant Breastfeeding How should I use this medication? Take this medication by mouth with a glass of water. Follow the directions on the prescription label. Take your medication at regular intervals. Do not take it more often than directed. Do not stop taking except on your care team's advice. Talk to your care team about the use of this medication in children. While this  medication may be prescribed for children 17 years or older for selected conditions, precautions do apply. Overdosage: If you think you have taken too much of this medicine contact a poison control center or emergency room at once. NOTE: This medicine is only for you. Do not share this medicine with others. What if I miss a dose? If you miss a dose, take it as soon as you can. If it is almost time for your next dose, take only that dose. Do not take double or extra doses. What may interact with this medication? Do not take this medication with any of the following: MAOIs, such as Carbex, Eldepryl, Marplan, Nardil, and Parnate This medication may also interact with the following: Alcohol Certain medications for depression, anxiety, or other mental health conditions Certain medications for blood pressure Linezolid Medications for colds or breathing difficulties, such as pseudoephedrine or phenylephrine Medications for diabetes Sibutramine Stimulant medications for ADHD, weight loss, or staying awake This list may not describe all possible interactions. Give your health care provider a list of all the medicines, herbs, non-prescription drugs, or dietary supplements you use. Also tell them if you smoke, drink alcohol, or use illegal drugs. Some items may interact with your medicine. What should I watch for while using this medication? Visit your care team for regular checks on your progress. Do not stop taking except on your care team's advice. You may develop a severe reaction. Your care team will tell you how much medication to take. Do not take this medication close to bedtime. It may prevent you from sleeping. This medication may affect your  coordination, reaction time, or judgment. Do not drive or operate machinery until you know how this medication affects you. Sit up or stand slowly to reduce the risk of dizzy or fainting spells. Drinking alcohol with this medication can increase the risk of  these side effects. This medication may affect blood sugar levels. Ask your care team if changes in diet or medications are needed if you have diabetes. Inform your care team if you wish to become pregnant or think you might be pregnant. Losing weight while pregnant is not advised and may cause harm to the unborn child. Talk to your care team for more information. What side effects may I notice from receiving this medication? Side effects that you should report to your care team as soon as possible: Allergic reactions--skin rash, itching, hives, swelling of the face, lips, tongue, or throat Heart valve disease--shortness of breath, chest pain, unusual weakness or fatigue, dizziness, feeling faint or lightheaded, fever, sudden weight gain, fast or irregular heartbeat Pulmonary hypertension--shortness of breath, chest pain, fast or irregular heartbeat, feeling faint or lightheaded, fatigue, swelling of the ankles or feet Side effects that usually do not require medical attention (report to your care team if they continue or are bothersome): Change in taste Diarrhea Dizziness Dry mouth Restlessness Trouble sleeping This list may not describe all possible side effects. Call your doctor for medical advice about side effects. You may report side effects to FDA at 1-800-FDA-1088. Where should I keep my medication? Keep out of the reach of children. This medication can be abused. Keep your medication in a safe place to protect it from theft. Do not share this medication with anyone. Selling or giving away this medication is dangerous and against the law. This medication may cause harm and death if it is taken by other adults, children, or pets. Return medication that has not been used to an official disposal site. Contact the DEA at (726) 695-7970 or your city/county government to find a site. If you cannot return the medication, mix any unused medication with a substance like cat litter or coffee grounds.  Then throw the medication away in a sealed container like a sealed bag or coffee can with a lid. Do not use the medication after the expiration date. Store at room temperature between 20 and 25 degrees C (68 and 77 degrees F). Keep container tightly closed. NOTE: This sheet is a summary. It may not cover all possible information. If you have questions about this medicine, talk to your doctor, pharmacist, or health care provider.  2023 Elsevier/Gold Standard (2008-01-13 00:00:00)    Health Maintenance, Female Adopting a healthy lifestyle and getting preventive care are important in promoting health and wellness. Ask your health care provider about: The right schedule for you to have regular tests and exams. Things you can do on your own to prevent diseases and keep yourself healthy. What should I know about diet, weight, and exercise? Eat a healthy diet  Eat a diet that includes plenty of vegetables, fruits, low-fat dairy products, and lean protein. Do not eat a lot of foods that are high in solid fats, added sugars, or sodium. Maintain a healthy weight Body mass index (BMI) is used to identify weight problems. It estimates body fat based on height and weight. Your health care provider can help determine your BMI and help you achieve or maintain a healthy weight. Get regular exercise Get regular exercise. This is one of the most important things you can do for your health.  Most adults should: Exercise for at least 150 minutes each week. The exercise should increase your heart rate and make you sweat (moderate-intensity exercise). Do strengthening exercises at least twice a week. This is in addition to the moderate-intensity exercise. Spend less time sitting. Even light physical activity can be beneficial. Watch cholesterol and blood lipids Have your blood tested for lipids and cholesterol at 32 years of age, then have this test every 5 years. Have your cholesterol levels checked more often  if: Your lipid or cholesterol levels are high. You are older than 32 years of age. You are at high risk for heart disease. What should I know about cancer screening? Depending on your health history and family history, you may need to have cancer screening at various ages. This may include screening for: Breast cancer. Cervical cancer. Colorectal cancer. Skin cancer. Lung cancer. What should I know about heart disease, diabetes, and high blood pressure? Blood pressure and heart disease High blood pressure causes heart disease and increases the risk of stroke. This is more likely to develop in people who have high blood pressure readings or are overweight. Have your blood pressure checked: Every 3-5 years if you are 49-58 years of age. Every year if you are 29 years old or older. Diabetes Have regular diabetes screenings. This checks your fasting blood sugar level. Have the screening done: Once every three years after age 70 if you are at a normal weight and have a low risk for diabetes. More often and at a younger age if you are overweight or have a high risk for diabetes. What should I know about preventing infection? Hepatitis B If you have a higher risk for hepatitis B, you should be screened for this virus. Talk with your health care provider to find out if you are at risk for hepatitis B infection. Hepatitis C Testing is recommended for: Everyone born from [birth year] through 1965. Anyone with known risk factors for hepatitis C. Sexually transmitted infections (STIs) Get screened for STIs, including gonorrhea and chlamydia, if: You are sexually active and are younger than 32 years of age. You are older than 32 years of age and your health care provider tells you that you are at risk for this type of infection. Your sexual activity has changed since you were last screened, and you are at increased risk for chlamydia or gonorrhea. Ask your health care provider if you are at risk. Ask  your health care provider about whether you are at high risk for HIV. Your health care provider may recommend a prescription medicine to help prevent HIV infection. If you choose to take medicine to prevent HIV, you should first get tested for HIV. You should then be tested every 3 months for as long as you are taking the medicine. Pregnancy If you are about to stop having your period (premenopausal) and you may become pregnant, seek counseling before you get pregnant. Take 400 to 800 micrograms (mcg) of folic acid every day if you become pregnant. Ask for birth control (contraception) if you want to prevent pregnancy. Osteoporosis and menopause Osteoporosis is a disease in which the bones lose minerals and strength with aging. This can result in bone fractures. If you are 32 years old or older, or if you are at risk for osteoporosis and fractures, ask your health care provider if you should: Be screened for bone loss. Take a calcium or vitamin D supplement to lower your risk of fractures. Be given hormone replacement therapy (HRT)  to treat symptoms of menopause. Follow these instructions at home: Alcohol use Do not drink alcohol if: Your health care provider tells you not to drink. You are pregnant, may be pregnant, or are planning to become pregnant. If you drink alcohol: Limit how much you have to: 0-1 drink a day. Know how much alcohol is in your drink. In the [place] one drink equals one 12 oz bottle of beer (355 mL), one 5 oz glass of wine (148 mL), or one 1 oz glass of hard liquor (44 mL). Lifestyle Do not use any products that contain nicotine or tobacco. These products include cigarettes, chewing tobacco, and vaping devices, such as e-cigarettes. If you need help quitting, ask your health care provider. Do not use street drugs. Do not share needles. Ask your health care provider for help if you need support or information about quitting drugs. General instructions Schedule regular  health, dental, and eye exams. Stay current with your vaccines. Tell your health care provider if: You often feel depressed. You have ever been abused or do not feel safe at home. Summary Adopting a healthy lifestyle and getting preventive care are important in promoting health and wellness. Follow your health care provider's instructions about healthy diet, exercising, and getting tested or screened for diseases. Follow your health care provider's instructions on monitoring your cholesterol and blood pressure. This information is not intended to replace advice given to you by your health care provider. Make sure you discuss any questions you have with your health care provider. Document Revised: 04/13/2021 Document Reviewed: 04/13/2021 Elsevier Patient Education  2024 [employer].

## 2023-05-24 NOTE — Assessment & Plan Note (Addendum)
Pending A1c.  She has previously done well on phentermine, as well as metformin.  Her preference is to start with phentermine as it was most effective for her in the past.  She tolerated medication well.  She is no longer nursing nor planning to become pregnant .  Counseled on side effects of phentermine and how to titrate medication.  Start phentermine 37.5 mg.  Follow-up in 3 months time.  Will discuss metformin as a maintenance medication

## 2023-05-24 NOTE — Assessment & Plan Note (Signed)
Deferred pelvic exam clinical breast exam as patient is following with [facility], Dr. Shella Spearing.  Requesting Pap results.  Encouraged self breast exam at home.  Encouraged continued walking.

## 2023-05-24 NOTE — Telephone Encounter (Signed)
Pt has Family h/o colon cancer.  Colonoscopy in 2022 per patient in [place], [place] , [facility].   Please call and get records of visit and colonoscopy

## 2023-05-24 NOTE — Assessment & Plan Note (Addendum)
Discussed maternal grandmother colon cancer diagnosis 32 years of age.  Patient reports colonoscopy  in 2021  in [place], [place] , [facility].  Requesting records.   Patient declines consult with gastroenterology this year to discuss further surveillance for colon cancer.  I am unsure of appropriate interval however suggested to the patient 5 years from prior would likely be acceptable.  Patient would like to discuss this next year and will likely want to have gastroenterology consult at that time.

## 2023-05-24 NOTE — Progress Notes (Signed)
Assessment & Plan:  Prediabetes Assessment & Plan: Pending A1c.  She has previously done well on phentermine, as well as metformin.  Her preference is to start with phentermine as it was most effective for her in the past.  She tolerated medication well.  She is no longer nursing nor planning to become pregnant .  Counseled on side effects of phentermine and how to titrate medication.  Start phentermine 37.5 mg.  Follow-up in 3 months time.  Will discuss metformin as a maintenance medication  Orders: -     Hemoglobin A1c -     Phentermine HCl; Take half tablet daily  PO prior to breakfast.After a week, may increase to full tablet in needed  Dispense: 30 tablet; Refill: 2  Encounter for hepatitis C screening test for low risk patient -     Hepatitis C antibody  Encounter for lipid screening for cardiovascular disease -     Lipid panel  Concentration deficit -     Ambulatory referral to Psychiatry  Family history of colon cancer Assessment & Plan: Discussed maternal grandmother colon cancer diagnosis 31 years of age.  Patient reports colonoscopy  in 2021  in [place], [place] , [facility].  Requesting records.   Patient declines consult with gastroenterology this year to discuss further surveillance for colon cancer.  I am unsure of appropriate interval however suggested to the patient 5 years from prior would likely be acceptable.  Patient would like to discuss this next year and will likely want to have gastroenterology consult at that time.   Annual physical exam Assessment & Plan: Deferred pelvic exam clinical breast exam as patient is following with [facility], Dr. Shella Spearing.  Requesting Pap results.  Encouraged self breast exam at home.  Encouraged continued walking.      Return precautions given.   Risks, benefits, and alternatives of the medications and treatment plan prescribed today were discussed, and patient expressed understanding.   Education regarding symptom  management and diagnosis given to patient on AVS either electronically or printed.  No follow-ups on file.  Rennie Plowman, FNP  Subjective:    Patient ID: Cheryl Galvan, female    DOB: Mar 16, 1991, 32 y.o.   MRN: [id number]  CC: Cheryl Galvan is a 32 y.o. female who presents today for physical exam.    HPI: Overall feels.  She is frustrated by weight gain.  She has been losing weight but feels that she has plateaued .  she had been on metformin 2000mg  daily while pregnant.  Prior to pregnancy, she was taking metformin XR 1000mg  every day.   She is no longer nursing.  She is working out again and is 18lbs away from goal weight.   She had done well on phentermine in the past without complications, chest pain.  She was able to lose weight by taking half tablet.     H/o GDM  She endorses trouble concentrating.  Previously referred for ADHD testing.  She requests new referral   Family h/o colon cancer.  Colonoscopy in 2021 per patient in [place], [place] , [facility].   Cervical Cancer Screening: Per patient, Pap smear obtained 6 months ago with [facility], [facility], [facility] Dr Rosemary Holms         Tetanus - UTD        Exercise: Gets regular exercise.   Alcohol use:  none Smoking/tobacco use: Nonsmoker.    Health Maintenance  Topic Date Due   COVID-19 Vaccine (1) Never done   Hepatitis C  Screening  Never done   Pap Smear  04/05/2016   Flu Shot  07/07/2023   DTaP/Tdap/Td vaccine (4 - Td or Tdap) 12/28/2032   HIV Screening  Completed   HPV Vaccine  Aged Out    ALLERGIES: Patient has no known allergies.  Current Outpatient Medications on File Prior to Visit  Medication Sig Dispense Refill   cefdinir (OMNICEF) 300 MG capsule Take 1 capsule (300 mg total) by mouth 2 (two) times daily for 5 days. 10 capsule 0   No current facility-administered medications on file prior to visit.    Review of Systems  Constitutional:  Negative for chills, fever and unexpected weight change.   HENT:  Negative for congestion.   Respiratory:  Negative for cough.   Cardiovascular:  Negative for chest pain, palpitations and leg swelling.  Gastrointestinal:  Negative for nausea and vomiting.  Musculoskeletal:  Negative for arthralgias and myalgias.  Skin:  Negative for rash.  Neurological:  Negative for headaches.  Hematological:  Negative for adenopathy.  Psychiatric/Behavioral:  Negative for confusion.       Objective:    BP 112/80   Pulse 73   Temp 97.7 F (36.5 C) (Oral)   Ht 5' 6.5" (1.689 m)   Wt 198 lb 12.8 oz (90.2 kg)   SpO2 99%   BMI 31.61 kg/m   BP Readings from Last 3 Encounters:  05/24/23 112/80  05/19/23 100/68  12/28/22 121/82   Wt Readings from Last 3 Encounters:  05/24/23 198 lb 12.8 oz (90.2 kg)  05/19/23 198 lb 9.6 oz (90.1 kg)  12/27/22 221 lb 9.6 oz (100.5 kg)    Physical Exam Vitals reviewed.  Constitutional:      Appearance: She is well-developed.  Eyes:     Conjunctiva/sclera: Conjunctivae normal.  Neck:     Thyroid: No thyroid mass or thyromegaly.  Cardiovascular:     Rate and Rhythm: Normal rate and regular rhythm.     Pulses: Normal pulses.     Heart sounds: Normal heart sounds.  Pulmonary:     Effort: Pulmonary effort is normal.     Breath sounds: Normal breath sounds. No wheezing, rhonchi or rales.  Lymphadenopathy:     Head:     Right side of head: No submental, submandibular, tonsillar, preauricular, posterior auricular or occipital adenopathy.     Left side of head: No submental, submandibular, tonsillar, preauricular, posterior auricular or occipital adenopathy.     Cervical: No cervical adenopathy.  Skin:    General: Skin is warm and dry.  Neurological:     Mental Status: She is alert.  Psychiatric:        Speech: Speech normal.        Behavior: Behavior normal.        Thought Content: Thought content normal.

## 2023-05-24 NOTE — Telephone Encounter (Signed)
Lft pt vm to call ofc to sch CT. thanks 

## 2023-05-25 DIAGNOSIS — F411 Generalized anxiety disorder: Secondary | ICD-10-CM | POA: Diagnosis not present

## 2023-05-25 DIAGNOSIS — Z600 Problems of adjustment to life-cycle transitions: Secondary | ICD-10-CM | POA: Diagnosis not present

## 2023-05-25 LAB — HEPATITIS C ANTIBODY: Hepatitis C Ab: NONREACTIVE

## 2023-05-27 NOTE — Telephone Encounter (Signed)
Electronically requested.

## 2023-05-29 ENCOUNTER — Encounter: Payer: Self-pay | Admitting: Family Medicine

## 2023-05-29 DIAGNOSIS — N3000 Acute cystitis without hematuria: Secondary | ICD-10-CM | POA: Insufficient documentation

## 2023-05-29 DIAGNOSIS — R509 Fever, unspecified: Secondary | ICD-10-CM | POA: Insufficient documentation

## 2023-05-29 NOTE — Assessment & Plan Note (Addendum)
Urine positive for E Coli>100000 cfu Start Cefdinir 300 mg BID x 5 days Start probiotics daily and continue for 14 days after treatment Can discontinue Dicloxacillin.  Not currently breast feeding. Follow up with PCP if no improvement

## 2023-05-29 NOTE — Assessment & Plan Note (Addendum)
Patient with history of nephrolithiasis status post stent removal.  Recently treated for UTI with some improvement and seems to have returned.  Also treated for mastitis and is currently on dicloxacillin.  Continues to be febrile with bodyaches. Check CBC, ESR, TSH, CMET, CRP POC urine Urine culture CT renal study given history of nephrolithiasis previously requiring stent placement but has since been removed If symptoms worsen recommend  ED visit

## 2023-05-31 ENCOUNTER — Encounter: Payer: BC Managed Care – PPO | Admitting: Family

## 2023-06-01 ENCOUNTER — Encounter: Payer: BC Managed Care – PPO | Admitting: Family

## 2023-06-03 ENCOUNTER — Telehealth: Payer: Self-pay

## 2023-06-03 DIAGNOSIS — Z6830 Body mass index (BMI) 30.0-30.9, adult: Secondary | ICD-10-CM

## 2023-06-03 DIAGNOSIS — N390 Urinary tract infection, site not specified: Secondary | ICD-10-CM

## 2023-06-03 MED ORDER — NITROFURANTOIN MONOHYD MACRO 100 MG PO CAPS: 100.0000 mg | ORAL_CAPSULE | Freq: Two times a day (BID) | ORAL | 0 refills | Status: DC

## 2023-06-03 MED ORDER — METFORMIN HCL ER 500 MG PO TB24: 500.0000 mg | ORAL_TABLET | Freq: Two times a day (BID) | ORAL | 3 refills | Status: DC

## 2023-06-03 NOTE — Telephone Encounter (Signed)
Called and spoke with patient, she states she has finished the antibiotic prescribed by Dr Clent Ridges,  cefdinir (OMNICEF) 300 MG capsule. She states her symptoms were better for 3-4 days and now they are back. She is currently experiencing; Frequent urination, dysuria, vaginal itching, right sided flank pain. Denies fever, chills, body aches. She states she has taken amoxicillin and macrobid in the past and the infection has come back with both of those as well. Patient states she can come by to leave a urine sample is necessary. She states she has 3 kids under 4 at home for the summer and its hard for her to come in for an appt. Please advise.

## 2023-06-03 NOTE — Telephone Encounter (Signed)
Patient states she has finished the antibiotics and still believes she has a UTI.  Patient would like to know what her next step is.

## 2023-06-03 NOTE — Telephone Encounter (Signed)
  Spoke with pt  H/o renal stone 'I know it's not a kidney stone'. She feels dull pain and reminds her of prior UTI  No fever, chill, increased vaginal discharge  She endorses dysuria, urinary frequency  She would also like to be changed from phentermine to metformin which she has previously taken.  Phentermine has not been helpful for weight loss.  Plan:   Start Macrobid Probiotics Stop cefdinir Urine studies at [facility] this weekend  Start metformin 500mg  bid Stop phentermine  Strict return precautions if pain were to get worse or she develop fever to go to urgent care or ED over the wkd

## 2023-06-06 ENCOUNTER — Encounter: Payer: Self-pay | Admitting: Radiology

## 2023-06-06 ENCOUNTER — Emergency Department: Payer: BC Managed Care – PPO

## 2023-06-06 ENCOUNTER — Other Ambulatory Visit
Admission: RE | Admit: 2023-06-06 | Discharge: 2023-06-06 | Disposition: A | Discharge status: Home / Self Care | Payer: BC Managed Care – PPO | Attending: Family | Admitting: Family

## 2023-06-06 ENCOUNTER — Other Ambulatory Visit: Payer: Self-pay

## 2023-06-06 ENCOUNTER — Emergency Department
Admission: EM | Admit: 2023-06-06 | Discharge: 2023-06-06 | Disposition: A | Discharge status: Home / Self Care | Payer: BC Managed Care – PPO | Attending: Emergency Medicine | Admitting: Emergency Medicine

## 2023-06-06 DIAGNOSIS — N200 Calculus of kidney: Secondary | ICD-10-CM | POA: Diagnosis not present

## 2023-06-06 DIAGNOSIS — R109 Unspecified abdominal pain: Secondary | ICD-10-CM

## 2023-06-06 DIAGNOSIS — N390 Urinary tract infection, site not specified: Secondary | ICD-10-CM | POA: Insufficient documentation

## 2023-06-06 DIAGNOSIS — R1031 Right lower quadrant pain: Secondary | ICD-10-CM | POA: Diagnosis not present

## 2023-06-06 DIAGNOSIS — R110 Nausea: Secondary | ICD-10-CM | POA: Insufficient documentation

## 2023-06-06 LAB — CBC
HCT: 40.9 % (ref 36.0–46.0)
Hemoglobin: 13 g/dL (ref 12.0–15.0)
MCH: 28.2 pg (ref 26.0–34.0)
MCHC: 31.8 g/dL (ref 30.0–36.0)
MCV: 88.7 fL (ref 80.0–100.0)
Platelets: 304 10*3/uL (ref 150–400)
RBC: 4.61 MIL/uL (ref 3.87–5.11)
RDW: 13.2 % (ref 11.5–15.5)
WBC: 7.8 10*3/uL (ref 4.0–10.5)
nRBC: 0 % (ref 0.0–0.2)

## 2023-06-06 LAB — BASIC METABOLIC PANEL
Anion gap: 11 (ref 5–15)
BUN: 21 mg/dL — ABNORMAL HIGH (ref 6–20)
CO2: 20 mmol/L — ABNORMAL LOW (ref 22–32)
Calcium: 9 mg/dL (ref 8.9–10.3)
Chloride: 106 mmol/L (ref 98–111)
Creatinine, Ser: 0.81 mg/dL (ref 0.44–1.00)
GFR, Estimated: >60 mL/min (ref >60)
Glucose, Bld: 117 mg/dL — ABNORMAL HIGH (ref 70–99)
Potassium: 4 mmol/L (ref 3.5–5.1)
Sodium: 137 mmol/L (ref 135–145)

## 2023-06-06 LAB — URINALYSIS, ROUTINE W REFLEX MICROSCOPIC
Bacteria, UA: NONE SEEN
Bilirubin Urine: NEGATIVE
Bilirubin Urine: NEGATIVE
Glucose, UA: NEGATIVE mg/dL
Glucose, UA: NEGATIVE mg/dL
Hgb urine dipstick: NEGATIVE
Ketones, ur: NEGATIVE mg/dL
Ketones, ur: NEGATIVE mg/dL
Leukocytes,Ua: NEGATIVE
Leukocytes,Ua: NEGATIVE
Nitrite: NEGATIVE
Nitrite: NEGATIVE
Protein, ur: NEGATIVE mg/dL
Protein, ur: NEGATIVE mg/dL
Specific Gravity, Urine: 1.014 (ref 1.005–1.030)
Specific Gravity, Urine: 1.011 (ref 1.005–1.030)
pH: 6 (ref 5.0–8.0)
pH: 6 (ref 5.0–8.0)

## 2023-06-06 LAB — POC URINE PREG, ED: Preg Test, Ur: NEGATIVE

## 2023-06-06 MED ORDER — ONDANSETRON HCL 4 MG/2ML IJ SOLN
4.0000 mg | Freq: Once | INTRAMUSCULAR | Status: AC
  Administered 2023-06-06: 4 mg via INTRAVENOUS
  Filled 2023-06-06: qty 2

## 2023-06-06 MED ORDER — IOHEXOL 300 MG/ML  SOLN
100.0000 mL | Freq: Once | INTRAMUSCULAR | Status: AC | PRN
  Administered 2023-06-06: 100 mL via INTRAVENOUS

## 2023-06-06 MED ORDER — KETOROLAC TROMETHAMINE 15 MG/ML IJ SOLN
15.0000 mg | Freq: Once | INTRAMUSCULAR | Status: AC
  Administered 2023-06-06: 15 mg via INTRAVENOUS
  Filled 2023-06-06: qty 1

## 2023-06-06 NOTE — Discharge Instructions (Signed)
Pain control:  Ibuprofen (motrin/aleve/advil) - You can take 3 tablets (600 mg) every 6 hours as needed for pain/fever.  Acetaminophen (tylenol) - You can take 2 extra strength tablets (1000 mg) every 6 hours as needed for pain/fever.  You can alternate these medications or take them together.  Make sure you eat food/drink water when taking these medications.   

## 2023-06-06 NOTE — ED Provider Notes (Signed)
[place] [facility] Provider Note    Event Date/Time   First MD Initiated Contact with Patient 06/06/23 2208     (approximate)   History   Flank Pain   HPI  Cheryl Galvan is a 32 y.o. female presents to the emergency department with right-sided flank pain.  Patient had a spontaneous vaginal delivery in January and states that since that time she has had multiple issues.  Frequent urinary tract infections and is currently on Macrobid.  History of kidney stones.  States that this pain over the past couple of months have been intermittent and worse to the right side.  Over the past 2 days has had worsening pain.  Not taking anything for pain at home because she was concerned that it could worsen her kidney pain.  Endorses some nausea but no episodes of vomiting.  No fever or chills.  No constipation.     Physical Exam   Triage Vital Signs: ED Triage Vitals  Enc Vitals Group     BP 06/06/23 2009 (!) 124/94     Pulse Rate 06/06/23 2009 86     Resp 06/06/23 2009 17     Temp 06/06/23 2009 98 F (36.7 C)     Temp src --      SpO2 06/06/23 2009 100 %     Weight 06/06/23 2012 198 lb (89.8 kg)     Height 06/06/23 2012 5\' 6"  (1.676 m)     Head Circumference --      Peak Flow --      Pain Score --      Pain Loc --      Pain Edu? --      Excl. in GC? --     Most recent vital signs: Vitals:   06/06/23 2009 06/06/23 2012  BP: (!) 124/94   Pulse: 86   Resp: 17   Temp: 98 F (36.7 C)   SpO2: 100% 97%    Physical Exam Constitutional:      Appearance: She is well-developed.  HENT:     Head: Atraumatic.  Eyes:     Conjunctiva/sclera: Conjunctivae normal.  Cardiovascular:     Rate and Rhythm: Regular rhythm.  Pulmonary:     Effort: No respiratory distress.  Abdominal:     General: There is no distension.     Tenderness: There is abdominal tenderness (Right-sided lower abdominal tenderness to palpation with no rebound or guarding). There is right CVA  tenderness.  Musculoskeletal:        General: Normal range of motion.     Cervical back: Normal range of motion.  Skin:    General: Skin is warm.  Neurological:     Mental Status: She is alert. Mental status is at baseline.     IMPRESSION / MDM / ASSESSMENT AND PLAN / ED COURSE  I reviewed the triage vital signs and the nursing notes.  Differential diagnosis including pyelonephritis, kidney stone, acute appendicitis, IBS, initial stool cystitis, malignancy   RADIOLOGY I independently reviewed imaging, my interpretation of imaging: CT abdomen pelvis with contrast with no acute findings.  Appears to have a normal appendix.  No signs of bowel obstruction.  No hydronephrosis.  Read as punctate nonobstructing calculi  LABS (all labs ordered are listed, but only abnormal results are displayed) Labs interpreted as -    Labs Reviewed  URINALYSIS, ROUTINE W REFLEX MICROSCOPIC - Abnormal; Notable for the following components:      Result Value  Color, Urine STRAW (*)    APPearance CLEAR (*)    Hgb urine dipstick MODERATE (*)    All other components within normal limits  BASIC METABOLIC PANEL - Abnormal; Notable for the following components:   CO2 20 (*)    Glucose, Bld 117 (*)    BUN 21 (*)    All other components within normal limits  POC URINE PREG, ED - Normal  CBC     MDM    UA with no signs of urinary tract infection.  Creatinine at baseline with no significant electrolyte abnormalities.  CT abdomen and pelvis without acute etiology to explain the patient's pain.  Possibly musculoskeletal.  Discussed ongoing evaluation with her primary care physician and gynecology.  Given return precautions for any worsening symptoms.  Discussed symptomatic treatment with Tylenol and Motrin.  Given return precautions for any ongoing or worsening symptoms.   PROCEDURES:  Critical Care performed: No  Procedures  Patient's presentation is most consistent with acute illness / injury  with system symptoms.   MEDICATIONS ORDERED IN ED: Medications  ondansetron (ZOFRAN) injection 4 mg (4 mg Intravenous Given 06/06/23 2229)  ketorolac (TORADOL) 15 MG/ML injection 15 mg (15 mg Intravenous Given 06/06/23 2229)  iohexol (OMNIPAQUE) 300 MG/ML solution 100 mL (100 mLs Intravenous Contrast Given 06/06/23 2245)    FINAL CLINICAL IMPRESSION(S) / ED DIAGNOSES   Final diagnoses:  Flank pain     Rx / DC Orders   ED Discharge Orders     None        Note:  This document was prepared using Dragon voice recognition software and may include unintentional dictation errors.   Corena Herter, MD 06/06/23 2332

## 2023-06-06 NOTE — ED Triage Notes (Signed)
Pt here with right flank pain. Pt states she has had this pain for the past month but over the past two days she has had increasing pain. Pt is on the 4th round of abx for UTI.Macrobid is the current ABX which she has been prescribed twice now.  Pt has previously been on Amoxicillin and Nitrofurantoin.

## 2023-06-08 ENCOUNTER — Ambulatory Visit: Payer: BC Managed Care – PPO

## 2023-06-08 LAB — URINE CULTURE: Culture: NO GROWTH

## 2023-06-14 DIAGNOSIS — F411 Generalized anxiety disorder: Secondary | ICD-10-CM | POA: Diagnosis not present

## 2023-06-14 DIAGNOSIS — Z600 Problems of adjustment to life-cycle transitions: Secondary | ICD-10-CM | POA: Diagnosis not present

## 2023-06-15 ENCOUNTER — Ambulatory Visit: Payer: BC Managed Care – PPO | Admitting: Family

## 2023-06-20 ENCOUNTER — Ambulatory Visit: Payer: BC Managed Care – PPO | Admitting: Family

## 2023-07-12 ENCOUNTER — Telehealth: Payer: Self-pay

## 2023-07-12 NOTE — Telephone Encounter (Signed)
Noted  

## 2023-07-12 NOTE — Telephone Encounter (Signed)
Patient states her metformin is not working.  Patient states she has not been checking her blood sugar.  Patient states she is gaining weight, working out with a trainer, so it's an intense workout, at least four days per week and watching her diet.  Patient states last time she dropped weight immediately when she took metformin before she was pregnant.  Patient states she is not having any of the side effects that she had the last time she took metformin, so she figures the medication is not working.  Patient states she is very tired.  Patient states Rennie Plowman, NP, told her to let her know if it wasn't working and schedule an appointment.  Patient has been scheduled to see Rennie Plowman, NP, on 07/15/2023.

## 2023-07-13 DIAGNOSIS — F411 Generalized anxiety disorder: Secondary | ICD-10-CM | POA: Diagnosis not present

## 2023-07-13 DIAGNOSIS — Z600 Problems of adjustment to life-cycle transitions: Secondary | ICD-10-CM | POA: Diagnosis not present

## 2023-07-15 ENCOUNTER — Encounter: Payer: Self-pay | Admitting: Family

## 2023-07-15 ENCOUNTER — Ambulatory Visit: Payer: BC Managed Care – PPO

## 2023-07-15 ENCOUNTER — Ambulatory Visit: Payer: BC Managed Care – PPO | Admitting: Family

## 2023-07-15 VITALS — BP 118/82 | HR 73 | Temp 98.1°F | Ht 66.0 in | Wt 200.6 lb

## 2023-07-15 DIAGNOSIS — R7303 Prediabetes: Secondary | ICD-10-CM

## 2023-07-15 DIAGNOSIS — G8929 Other chronic pain: Secondary | ICD-10-CM

## 2023-07-15 DIAGNOSIS — M545 Low back pain, unspecified: Secondary | ICD-10-CM | POA: Diagnosis not present

## 2023-07-15 DIAGNOSIS — M4187 Other forms of scoliosis, lumbosacral region: Secondary | ICD-10-CM | POA: Diagnosis not present

## 2023-07-15 DIAGNOSIS — R519 Headache, unspecified: Secondary | ICD-10-CM | POA: Diagnosis not present

## 2023-07-15 DIAGNOSIS — M25551 Pain in right hip: Secondary | ICD-10-CM | POA: Diagnosis not present

## 2023-07-15 MED ORDER — TOPIRAMATE 25 MG PO TABS: 25.0000 mg | ORAL_TABLET | Freq: Every day | ORAL | 0 refills | Status: DC

## 2023-07-15 NOTE — Assessment & Plan Note (Signed)
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 Xrays pending. She politely declines PT at this time. Discussed sports medicine referral.

## 2023-07-15 NOTE — Patient Instructions (Addendum)
Start magnesium citrate 400mg  daily for headache prevention.   Start topamax 25mg  at beditme and increase after 1 week to 50mg  if needed.    Please let me know how you are doing.   Sacroiliac Joint Dysfunction  Sacroiliac joint dysfunction is a condition that causes inflammation on one or both sides of the sacroiliac (SI) joint. The SI joint is the joint between two bones of the pelvis called the sacrum and the ilium. The sacrum is the bone at the base of the spine. The ilium is the large bone that forms the hip. This condition causes deep aching or burning pain in the low back. In some cases, the pain may also spread into one or both buttocks, hips, or thighs. What are the causes? This condition may be caused by: Pregnancy. During pregnancy, extra stress is put on the SI joints because the pelvis widens. Injury, such as: Injuries from car crashes. Sports-related injuries. Work-related injuries. Having one leg that is shorter than the other. Conditions that affect the joints, such as: Rheumatoid arthritis. Gout. Psoriatic arthritis. Joint infection (septic arthritis). Sometimes, the cause of SI joint dysfunction is not known. What are the signs or symptoms? Symptoms of this condition include: Aching or burning pain in the lower back. The pain may also spread to other areas, such as: Buttocks. Groin. Thighs. Muscle spasms in or around the painful areas. Increased pain when standing, walking, running, stair climbing, bending, or lifting. How is this diagnosed? This condition is diagnosed with a physical exam and your medical history. During the exam, the health care provider may move one or both of your legs to different positions to check for pain. Various tests may be done to confirm the diagnosis, including: Imaging tests to look for other causes of pain. These may include: MRI. CT scan. Bone scan. Diagnostic injection. A numbing medicine is injected into the SI joint using a  needle. If your pain is temporarily improved or stopped after the injection, this can indicate that SI joint dysfunction is the problem. How is this treated? Treatment depends on the cause and severity of your condition. Treatment options can be noninvasive and may include: Ice or heat applied to the lower back area after an injury. This may help reduce pain and muscle spasms. Medicines to relieve pain or inflammation or to relax the muscles. Wearing a back brace (sacroiliac brace) to help support the joint while your back is healing. Physical therapy to increase muscle strength around the joint and flexibility at the joint. This may also involve learning proper body positions and ways of moving to relieve stress on the joint. Direct manipulation of the SI joint. Use of a device that provides electrical stimulation to help reduce pain at the joint. Other treatments may include: Injections of steroid medicine into the joint to reduce pain and swelling. Radiofrequency ablation. This treatment uses heat to burn away nerves that are carrying pain messages from the joint. Surgery to put in screws and plates that limit or prevent joint motion. This is rare. Follow these instructions at home: Medicines Take over-the-counter and prescription medicines only as told by your health care provider. Ask your health care provider if the medicine prescribed to you: Requires you to avoid driving or using machinery. Can cause constipation. You may need to take these actions to prevent or treat constipation: Drink enough fluid to keep your urine pale yellow. Take over-the-counter or prescription medicines. Eat foods that are high in fiber, such as beans, whole  grains, and fresh fruits and vegetables. Limit foods that are high in fat and processed sugars, such as fried or sweet foods. If you have a brace: Wear the brace as told by your health care provider. Remove it only as told by your health care  provider. Keep the brace clean. If the brace is not waterproof: Do not let it get wet. Cover it with a watertight covering when you take a bath or a shower. Managing pain, stiffness, and swelling     Icing can help with pain and swelling. Heat may help with muscle tension or spasms. Ask your health care provider if you should use ice or heat. If directed, put ice on the affected area: If you have a removable brace, remove it as told by your health care provider. Put ice in a plastic bag. Place a towel between your skin and the bag. Leave the ice on for 20 minutes, 2-3 times a day. Remove the ice if your skin turns bright red. This is very important. If you cannot feel pain, heat, or cold, you have a greater risk of damage to the area. If directed, apply heat to the affected area as often as told by your health care provider. Use the heat source that your health care provider recommends, such as a moist heat pack or a heating pad. Place a towel between your skin and the heat source. Leave the heat on for 20-30 minutes. Remove the heat if your skin turns bright red. This is especially important if you are unable to feel pain, heat, or cold. You may have a greater risk of getting burned. General instructions Rest as needed. Return to your normal activities as told by your health care provider. Ask your health care provider what activities are safe for you. Do exercises as told by your health care provider or physical therapist. Keep all follow-up visits. This is important. Contact a health care provider if: Your pain is not controlled with medicine. You have a fever. Your pain is getting worse. Get help right away if: You have weakness, numbness, or tingling in your legs or feet. You lose control of your bladder or bowels. Summary Sacroiliac (SI) joint dysfunction is a condition that causes inflammation on one or both sides of the SI joint. This condition causes deep aching or burning  pain in the low back. In some cases, the pain may also spread into one or both buttocks, hips, or thighs. Treatment depends on the cause and severity of your condition. It may include medicines to reduce pain and swelling or to relax muscles. This information is not intended to replace advice given to you by your health care provider. Make sure you discuss any questions you have with your health care provider. Document Revised: 04/03/2020 Document Reviewed: 04/03/2020 Elsevier Patient Education  2024 [employer].

## 2023-07-15 NOTE — Progress Notes (Signed)
Assessment & Plan:  Chronic nonintractable headache, unspecified headache type Assessment & Plan: Chronic.  No alarm features at this time.  Start Topamax 25 mg and titrate.  Advised magnesium citrate 400 mg. Consider imitrex at follow up if needed.    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 Xrays pending. She politely declines PT at this time. Discussed sports medicine referral.   Orders: -     DG Lumbar Spine Complete; Future -     DG HIP UNILAT W OR W/O PELVIS 2-3 VIEWS RIGHT; Future -     Topiramate; Take 1 tablet (25 mg total) by mouth at bedtime.  Dispense: 90 tablet; Refill: 0  Prediabetes Assessment & Plan: Metformin has been ineffective for weight loss.  Trial of Topamax      Return precautions given.   Risks, benefits, and alternatives of the medications and treatment plan prescribed today were discussed, and patient expressed understanding.   Education regarding symptom management and diagnosis given to patient on AVS either electronically or printed.  Return in about 6 weeks (around 08/26/2023).  Rennie Plowman, FNP  Subjective:    Patient ID: Cheryl Galvan, female    DOB: January 24, 1991, 32 y.o.   MRN: [id number]  CC: Cheryl Galvan is a 32 y.o. female who presents today for follow up.   HPI: Complains of right low back pain , started 1.5 years ago, episodic.  She will have several days without the pain ;then it comes for 2 weeks, then spontaneously resolves.    Not worse after carrying children , eating, or around menses.   Started early in last pregnancy.   No known injury.   No nausea, dysuria, urinary frequency.   H/o renal stone and stent, s/p removal.   She had previously done PT for right hip years ago without resolution.   H/o scoliosis ( she never had surgery)   LMP: last week  Feels a like punch.   She is not tylenol or nsaids.   Complains of frontal HA, x  6 months ago after delivery of 3rd child.   4-5 HA per week.   Starts in the morning  HA is not worse HA of life. HA doesn't occur with intercourse, valsalva.   She has tried tylenol 500mg   or ibuprofen 800mg  without relief.   No N, v, vision  No caffeine use  Eye exam UTD  H/o migraines as a teenager and thinks she was on topamax and imitrex prn.   She had followed with Headache clinic in [place] in which she has been on daily preventative  She is frustrated by weight and metformin is not effective.   Previously on phentermine which was not effective.    She is no longer breast feeding H/o GDM with each of 3 pregnancies. She was taking metformin 2000mg  at that time.   She is not on OCP.   Allergies: Patient has no known allergies. Current Outpatient Medications on File Prior to Visit  Medication Sig Dispense Refill   metFORMIN (GLUCOPHAGE-XR) 500 MG 24 hr tablet Take 1 tablet (500 mg total) by mouth 2 (two) times daily with a meal. 180 tablet 3   No current facility-administered medications on file prior to visit.    Review of Systems  Constitutional:  Negative for chills and fever.  Eyes:  Negative for visual disturbance.  Respiratory:  Negative for cough.   Cardiovascular:  Negative for chest pain and palpitations.  Gastrointestinal:  Negative for blood in stool, constipation, diarrhea, nausea and vomiting.  Genitourinary:  Negative for difficulty urinating, dysuria, pelvic pain and urgency.  Musculoskeletal:  Positive for back pain.  Neurological:  Positive for headaches. Negative for dizziness and numbness.      Objective:    BP 118/82   Pulse 73   Temp 98.1 F (36.7 C) (Oral)   Ht 5\' 6"  (1.676 m)   Wt 200 lb 9.6 oz (91 kg)   SpO2 99%   BMI 32.38 kg/m  BP Readings from Last 3 Encounters:  07/15/23 118/82  06/06/23 126/89  05/24/23 112/80   Wt Readings from Last 3 Encounters:  07/15/23 200 lb 9.6 oz (91 kg)  06/06/23 198 lb (89.8 kg)   05/24/23 198 lb 12.8 oz (90.2 kg)    Physical Exam Vitals reviewed.  Constitutional:      Appearance: She is well-developed.  HENT:     Mouth/Throat:     Pharynx: Uvula midline.  Eyes:     Conjunctiva/sclera: Conjunctivae normal.  Cardiovascular:     Rate and Rhythm: Normal rate and regular rhythm.     Pulses: Normal pulses.     Heart sounds: Normal heart sounds.  Pulmonary:     Effort: Pulmonary effort is normal.     Breath sounds: Normal breath sounds. No wheezing, rhonchi or rales.  Musculoskeletal:     Lumbar back: No swelling, edema, spasms, tenderness or bony tenderness. Normal range of motion.     Comments: Full range of motion with flexion, tension, lateral side bends.  tenderness over right SI joint No pain, numbness, tingling elicited with single leg raise bilaterally.   Skin:    General: Skin is warm and dry.  Neurological:     Mental Status: She is alert.     Comments: Sensation and strength intact bilateral lower extremities.  Psychiatric:        Speech: Speech normal.        Behavior: Behavior normal.        Thought Content: Thought content normal.

## 2023-07-15 NOTE — Assessment & Plan Note (Signed)
Chronic.  No alarm features at this time.  Start Topamax 25 mg and titrate.  Advised magnesium citrate 400 mg. Consider imitrex at follow up if needed.

## 2023-07-15 NOTE — Assessment & Plan Note (Signed)
Metformin has been ineffective for weight loss.  Trial of Topamax

## 2023-07-21 ENCOUNTER — Encounter (INDEPENDENT_AMBULATORY_CARE_PROVIDER_SITE_OTHER): Payer: Self-pay

## 2023-07-28 ENCOUNTER — Encounter: Payer: Self-pay | Admitting: Nurse Practitioner

## 2023-07-28 ENCOUNTER — Ambulatory Visit: Payer: BC Managed Care – PPO | Admitting: Nurse Practitioner

## 2023-07-28 VITALS — BP 120/80 | HR 73 | Temp 97.6°F | Ht 66.0 in | Wt 202.6 lb

## 2023-07-28 DIAGNOSIS — J0110 Acute frontal sinusitis, unspecified: Secondary | ICD-10-CM | POA: Diagnosis not present

## 2023-07-28 DIAGNOSIS — F419 Anxiety disorder, unspecified: Secondary | ICD-10-CM | POA: Diagnosis not present

## 2023-07-28 DIAGNOSIS — F902 Attention-deficit hyperactivity disorder, combined type: Secondary | ICD-10-CM | POA: Diagnosis not present

## 2023-07-28 DIAGNOSIS — Z79899 Other long term (current) drug therapy: Secondary | ICD-10-CM | POA: Diagnosis not present

## 2023-07-28 MED ORDER — DOXYCYCLINE HYCLATE 100 MG PO TABS: 100.0000 mg | ORAL_TABLET | Freq: Two times a day (BID) | ORAL | 0 refills | Status: AC

## 2023-07-28 MED ORDER — BENZONATATE 200 MG PO CAPS: 200.0000 mg | ORAL_CAPSULE | Freq: Two times a day (BID) | ORAL | 0 refills | Status: DC | PRN

## 2023-07-28 MED ORDER — PREDNISONE 20 MG PO TABS: ORAL_TABLET | ORAL | 0 refills | Status: DC

## 2023-07-28 NOTE — Patient Instructions (Signed)
Rx sent to pharmacy.   Sinus Infection, Adult A sinus infection is soreness and swelling (inflammation) of your sinuses. Sinuses are hollow spaces in the bones around your face. They are located: Around your eyes. In the middle of your forehead. Behind your nose. In your cheekbones. Your sinuses and nasal passages are lined with a fluid called mucus. Mucus drains out of your sinuses. Swelling can trap mucus in your sinuses. This lets germs (bacteria, virus, or fungus) grow, which leads to infection. Most of the time, this condition is caused by a virus. What are the causes? Allergies. Asthma. Germs. Things that block your nose or sinuses. Growths in the nose (nasal polyps). Chemicals or irritants in the air. A fungus. This is rare. What increases the risk? Having a weak body defense system (immune system). Doing a lot of swimming or diving. Using nasal sprays too much. Smoking. What are the signs or symptoms? The main symptoms of this condition are pain and a feeling of pressure around the sinuses. Other symptoms include: Stuffy nose (congestion). This may make it hard to breathe through your nose. Runny nose (drainage). Soreness, swelling, and warmth in the sinuses. A cough that may get worse at night. Being unable to smell and taste. Mucus that collects in the throat or the back of the nose (postnasal drip). This may cause a sore throat or bad breath. Being very tired (fatigued). A fever. How is this diagnosed? Your symptoms. Your medical history. A physical exam. Tests to find out if your condition is short-term (acute) or long-term (chronic). Your doctor may: Check your nose for growths (polyps). Check your sinuses using a tool that has a light on one end (endoscope). Check for allergies or germs. Do imaging tests, such as an MRI or CT scan. How is this treated? Treatment for this condition depends on the cause and whether it is short-term or long-term. If caused by a  virus, your symptoms should go away on their own within 10 days. You may be given medicines to relieve symptoms. They include: Medicines that shrink swollen tissue in the nose. A spray that treats swelling of the nostrils. Rinses that help get rid of thick mucus in your nose (nasal saline washes). Medicines that treat allergies (antihistamines). Over-the-counter pain relievers. If caused by bacteria, your doctor may wait to see if you will get better without treatment. You may be given antibiotic medicine if you have: A very bad infection. A weak body defense system. If caused by growths in the nose, surgery may be needed. Follow these instructions at home: Medicines Take, use, or apply over-the-counter and prescription medicines only as told by your doctor. These may include nasal sprays. If you were prescribed an antibiotic medicine, take it as told by your doctor. Do not stop taking it even if you start to feel better. Hydrate and humidify  Drink enough water to keep your pee (urine) pale yellow. Use a cool mist humidifier to keep the humidity level in your home above 50%. Breathe in steam for 10-15 minutes, 3-4 times a day, or as told by your doctor. You can do this in the bathroom while a hot shower is running. Try not to spend time in cool or dry air. Rest Rest as much as you can. Sleep with your head raised (elevated). Make sure you get enough sleep each night. General instructions  Put a warm, moist washcloth on your face 3-4 times a day, or as often as told by your doctor. Use  nasal saline washes as often as told by your doctor. Wash your hands often with soap and water. If you cannot use soap and water, use hand sanitizer. Do not smoke. Avoid being around people who are smoking (secondhand smoke). Keep all follow-up visits. Contact a doctor if: You have a fever. Your symptoms get worse. Your symptoms do not get better within 10 days. Get help right away if: You have a  very bad headache. You cannot stop vomiting. You have very bad pain or swelling around your face or eyes. You have trouble seeing. You feel confused. Your neck is stiff. You have trouble breathing. These symptoms may be an emergency. Get help right away. Call 911. Do not wait to see if the symptoms will go away. Do not drive yourself to the hospital. Summary A sinus infection is swelling of your sinuses. Sinuses are hollow spaces in the bones around your face. This condition is caused by tissues in your nose that become inflamed or swollen. This traps germs. These can lead to infection. If you were prescribed an antibiotic medicine, take it as told by your doctor. Do not stop taking it even if you start to feel better. Keep all follow-up visits. This information is not intended to replace advice given to you by your health care provider. Make sure you discuss any questions you have with your health care provider. Document Revised: 10/27/2021 Document Reviewed: 10/27/2021 Elsevier Patient Education  2024 [employer].

## 2023-07-28 NOTE — Progress Notes (Signed)
Established Patient Office Visit  Subjective:  Patient ID: Cheryl Galvan, female    DOB: 1991-12-01  Age: 32 y.o. MRN: [id number]  CC:  Chief Complaint  Patient presents with   Cough    HPI  Cheryl Galvan presents forcough symptoms started Thursday and tested negative for COVID on Saturday.    Cough This is a new problem. The problem has been unchanged. The cough is Productive of sputum. Associated symptoms include headaches. Pertinent negatives include no chest pain, ear congestion, nasal congestion, rhinorrhea, sore throat or weight loss. Nothing aggravates the symptoms. Treatments tried: mucinex. The treatment provided mild relief.   Patient is not lactating.   Past Medical History:  Diagnosis Date   Acne    Allergy    sesonal allergy    Amblyopia    Anemia    Anxiety 2009   Asthma     no recent difficulties inhaler not needed for years   Depression    meds years ago   Gestational diabetes    History of kidney stones    History of migraine    none in years   Spontaneous vaginal delivery 07/28/2020   at [facility]    Past Surgical History:  Procedure Laterality Date   CYSTOSCOPY WITH STENT PLACEMENT Right 06/29/2020   Procedure: CYSTOSCOPY WITH STENT PLACEMENT;  Surgeon: Crist Fat, MD;  Location: [facility];  Service: Urology;  Laterality: Right;   CYSTOSCOPY/URETEROSCOPY/HOLMIUM LASER/STENT PLACEMENT Right 08/08/2020   Procedure: RIGHT URETEROSCOPY//STENT EXCHANGE/;  Surgeon: Crist Fat, MD;  Location: [facility];  Service: Urology;  Laterality: Right;   WISDOM TOOTH EXTRACTION  yrs ago    Family History  Problem Relation Age of Onset   Hypertension Mother    Allergic rhinitis Mother    Anxiety disorder Mother    Depression Mother    Alcohol abuse Father    Drug abuse Father    Allergic rhinitis Sister    Cancer Maternal Grandmother 52       colon   Stroke Maternal Grandmother    Diabetes Maternal Grandmother     Hypertension Maternal Grandmother    Cancer Maternal Grandfather 50       lung cancer   Stroke Maternal Grandfather    Cancer Paternal Grandfather        prostate   Heart disease Other    Angioedema Neg Hx    Eczema Neg Hx    Immunodeficiency Neg Hx    Urticaria Neg Hx     Social History   Socioeconomic History   Marital status: Married    Spouse name: Not on file   Number of children: 0   Years of education: Assc.    Highest education level: Not on file  Occupational History    Employer: OTHER    Comment: [employer]  Tobacco Use   Smoking status: Never   Smokeless tobacco: Never  Vaping Use   Vaping status: Never Used  Substance and Sexual Activity   Alcohol use: Not Currently   Drug use: No   Sexual activity: Yes    Birth control/protection: None  Other Topics Concern   Not on file  Social History Narrative         She staying home with kids.       3 kids, 4 yrs ( boy), 2 years ( boy)and 71 month old ( girl).       Husband has vasectomy.    Social Determinants of Health  Financial Resource Strain: Not on file  Food Insecurity: No Food Insecurity (12/27/2022)   Hunger Vital Sign    Worried About Running Out of Food in the Last Year: Never true    Ran Out of Food in the Last Year: Never true  Transportation Needs: No Transportation Needs (12/27/2022)   PRAPARE - Administrator, Civil Service (Medical): No    Lack of Transportation (Non-Medical): No  Physical Activity: Not on file  Stress: Not on file  Social Connections: Not on file  Intimate Partner Violence: Unknown (12/27/2022)   Humiliation, Afraid, Rape, and Kick questionnaire    Fear of Current or Ex-Partner: No    Emotionally Abused: No    Physically Abused: Not on file    Sexually Abused: Not on file     Outpatient Medications Prior to Visit  Medication Sig Dispense Refill   amphetamine-dextroamphetamine (ADDERALL) 15 MG tablet Take 15 mg by mouth daily.      metFORMIN (GLUCOPHAGE-XR) 500 MG 24 hr tablet Take 1 tablet (500 mg total) by mouth 2 (two) times daily with a meal. 180 tablet 3   topiramate (TOPAMAX) 25 MG tablet Take 1 tablet (25 mg total) by mouth at bedtime. 90 tablet 0   No facility-administered medications prior to visit.    No Known Allergies  ROS Review of Systems  Constitutional:  Negative for weight loss.  HENT:  Negative for rhinorrhea and sore throat.   Respiratory:  Positive for cough.   Cardiovascular:  Negative for chest pain.  Neurological:  Positive for headaches.   Negative unless indicated in HPI.    Objective:    Physical Exam HENT:     Right Ear: Tympanic membrane normal. Tympanic membrane is not erythematous.     Left Ear: Tympanic membrane normal. Tympanic membrane is not erythematous.     Nose:     Right Turbinates: Not enlarged.     Left Turbinates: Not enlarged.     Right Sinus: Frontal sinus tenderness present. No maxillary sinus tenderness.     Left Sinus: Frontal sinus tenderness present. No maxillary sinus tenderness.     Mouth/Throat:     Mouth: Mucous membranes are moist.     Pharynx: No pharyngeal swelling, oropharyngeal exudate or posterior oropharyngeal erythema.     Tonsils: No tonsillar exudate.  Cardiovascular:     Rate and Rhythm: Normal rate and regular rhythm.  Pulmonary:     Effort: Pulmonary effort is normal.     Breath sounds: Normal breath sounds. No stridor. No wheezing.  Neurological:     General: No focal deficit present.     Mental Status: She is oriented to person, place, and time. Mental status is at baseline.  Psychiatric:        Mood and Affect: Mood normal.        Behavior: Behavior normal.        Thought Content: Thought content normal.        Judgment: Judgment normal.     BP 120/80   Pulse 73   Temp 97.6 F (36.4 C) (Oral)   Ht 5\' 6"  (1.676 m)   Wt 202 lb 9.6 oz (91.9 kg)   LMP 07/07/2023 (Approximate)   SpO2 99%   BMI 32.70 kg/m  Wt Readings from  Last 3 Encounters:  07/28/23 202 lb 9.6 oz (91.9 kg)  07/15/23 200 lb 9.6 oz (91 kg)  06/06/23 198 lb (89.8 kg)     Health Maintenance  Topic Date  Due   PAP SMEAR-Modifier  04/05/2016   COVID-19 Vaccine (1 - 2023-24 season) Never done   INFLUENZA VACCINE  03/05/2024 (Originally 07/07/2023)   DTaP/Tdap/Td (4 - Td or Tdap) 12/28/2032   Hepatitis C Screening  Completed   HIV Screening  Completed   HPV VACCINES  Aged Out    There are no preventive care reminders to display for this patient.  Lab Results  Component Value Date   TSH 1.19 05/19/2023   Lab Results  Component Value Date   WBC 7.8 06/06/2023   HGB 13.0 06/06/2023   HCT 40.9 06/06/2023   MCV 88.7 06/06/2023   PLT 304 06/06/2023   Lab Results  Component Value Date   NA 137 06/06/2023   K 4.0 06/06/2023   CO2 20 (L) 06/06/2023   GLUCOSE 117 (H) 06/06/2023   BUN 21 (H) 06/06/2023   CREATININE 0.81 06/06/2023   BILITOT 0.3 05/19/2023   ALKPHOS 105 05/19/2023   AST 13 05/19/2023   ALT 11 05/19/2023   PROT 7.4 05/19/2023   ALBUMIN 4.4 05/19/2023   CALCIUM 9.0 06/06/2023   ANIONGAP 11 06/06/2023   GFR 116.23 05/19/2023   Lab Results  Component Value Date   CHOL 172 05/24/2023   Lab Results  Component Value Date   HDL 47.70 05/24/2023   Lab Results  Component Value Date   LDLCALC 92 05/24/2023   Lab Results  Component Value Date   TRIG 160.0 (H) 05/24/2023   Lab Results  Component Value Date   CHOLHDL 4 05/24/2023   Lab Results  Component Value Date   HGBA1C 6.1 05/24/2023      Assessment & Plan:  Acute frontal sinusitis, recurrence not specified Assessment & Plan: Frontal sinus tenderness, vital stable. Will treat with doxycycline, prednisone and Tessalon. Advised to increase fluid intake. And use steam or humidifier.    Other orders -     Doxycycline Hyclate; Take 1 tablet (100 mg total) by mouth 2 (two) times daily for 10 days.  Dispense: 20 tablet; Refill: 0 -     predniSONE;  Take 2 tablet (40 mg total) by mouth daily with breakfast for 5 days.  Dispense: 10 tablet; Refill: 0 -     Benzonatate; Take 1 capsule (200 mg total) by mouth 2 (two) times daily as needed for cough.  Dispense: 30 capsule; Refill: 0    Follow-up: Return if symptoms worsen or fail to improve.   Kara Dies, NP

## 2023-07-29 ENCOUNTER — Encounter: Payer: Self-pay | Admitting: *Deleted

## 2023-08-02 ENCOUNTER — Ambulatory Visit: Payer: BC Managed Care – PPO | Admitting: Family

## 2023-08-02 DIAGNOSIS — Z79899 Other long term (current) drug therapy: Secondary | ICD-10-CM | POA: Diagnosis not present

## 2023-08-02 DIAGNOSIS — R41840 Attention and concentration deficit: Secondary | ICD-10-CM | POA: Diagnosis not present

## 2023-08-03 NOTE — Assessment & Plan Note (Signed)
Frontal sinus tenderness, vital stable. Will treat with doxycycline, prednisone and Tessalon. Advised to increase fluid intake. And use steam or humidifier.

## 2023-08-10 DIAGNOSIS — F411 Generalized anxiety disorder: Secondary | ICD-10-CM | POA: Diagnosis not present

## 2023-08-10 DIAGNOSIS — Z600 Problems of adjustment to life-cycle transitions: Secondary | ICD-10-CM | POA: Diagnosis not present

## 2023-08-23 DIAGNOSIS — F411 Generalized anxiety disorder: Secondary | ICD-10-CM | POA: Diagnosis not present

## 2023-08-23 DIAGNOSIS — Z600 Problems of adjustment to life-cycle transitions: Secondary | ICD-10-CM | POA: Diagnosis not present

## 2023-08-29 ENCOUNTER — Ambulatory Visit: Payer: BC Managed Care – PPO | Admitting: Family

## 2023-08-31 DIAGNOSIS — F902 Attention-deficit hyperactivity disorder, combined type: Secondary | ICD-10-CM | POA: Diagnosis not present

## 2023-08-31 DIAGNOSIS — Z79899 Other long term (current) drug therapy: Secondary | ICD-10-CM | POA: Diagnosis not present

## 2023-08-31 DIAGNOSIS — F419 Anxiety disorder, unspecified: Secondary | ICD-10-CM | POA: Diagnosis not present

## 2023-09-12 ENCOUNTER — Other Ambulatory Visit (HOSPITAL_COMMUNITY)
Admission: RE | Admit: 2023-09-12 | Discharge: 2023-09-12 | Disposition: A | Discharge status: Home / Self Care | Payer: BC Managed Care – PPO | Source: Ambulatory Visit | Attending: Family | Admitting: Family

## 2023-09-12 ENCOUNTER — Ambulatory Visit (INDEPENDENT_AMBULATORY_CARE_PROVIDER_SITE_OTHER): Payer: BC Managed Care – PPO | Admitting: Family

## 2023-09-12 ENCOUNTER — Encounter: Payer: Self-pay | Admitting: Family

## 2023-09-12 VITALS — BP 124/84 | HR 84 | Temp 97.8°F | Ht 66.0 in | Wt 199.4 lb

## 2023-09-12 DIAGNOSIS — R300 Dysuria: Secondary | ICD-10-CM

## 2023-09-12 DIAGNOSIS — F909 Attention-deficit hyperactivity disorder, unspecified type: Secondary | ICD-10-CM | POA: Insufficient documentation

## 2023-09-12 DIAGNOSIS — N898 Other specified noninflammatory disorders of vagina: Secondary | ICD-10-CM | POA: Insufficient documentation

## 2023-09-12 DIAGNOSIS — G8929 Other chronic pain: Secondary | ICD-10-CM

## 2023-09-12 DIAGNOSIS — R519 Headache, unspecified: Secondary | ICD-10-CM

## 2023-09-12 DIAGNOSIS — R7303 Prediabetes: Secondary | ICD-10-CM | POA: Diagnosis not present

## 2023-09-12 LAB — POCT GLYCOSYLATED HEMOGLOBIN (HGB A1C): Hemoglobin A1C: 6 % — AB (ref 4.0–5.6)

## 2023-09-12 LAB — POCT URINALYSIS DIPSTICK
Bilirubin, UA: NEGATIVE
Blood, UA: NEGATIVE
Glucose, UA: NEGATIVE
Ketones, UA: NEGATIVE
Nitrite, UA: NEGATIVE
Protein, UA: NEGATIVE
Spec Grav, UA: 1.025 (ref 1.010–1.025)
Urobilinogen, UA: 0.2 U/dL
pH, UA: 7 (ref 5.0–8.0)

## 2023-09-12 LAB — URINALYSIS, ROUTINE W REFLEX MICROSCOPIC
Bilirubin Urine: NEGATIVE
Hgb urine dipstick: NEGATIVE
Ketones, ur: NEGATIVE
Leukocytes,Ua: NEGATIVE
Nitrite: NEGATIVE
Specific Gravity, Urine: 1.015 (ref 1.000–1.030)
Total Protein, Urine: NEGATIVE
Urine Glucose: NEGATIVE
Urobilinogen, UA: 0.2 (ref 0.0–1.0)
pH: 7 (ref 5.0–8.0)

## 2023-09-12 MED ORDER — METFORMIN HCL ER 500 MG PO TB24
500.0000 mg | ORAL_TABLET | Freq: Every evening | ORAL | 2 refills | Status: DC
Start: 2023-09-12 — End: 2023-12-01

## 2023-09-12 NOTE — Progress Notes (Signed)
Assessment & Plan:  Burning with urination -     Urinalysis, Routine w reflex microscopic -     Urine Culture -     POCT urinalysis dipstick  Vaginal itching Assessment & Plan: Afebrile.  Pending wet prep, urine studies.  Urinalysis with trace leukocytes.  Patient in agreement with waiting for urine culture, wet prep prior to starting any treatment. Of note, recurrent bacterial vaginosis.  Consider vaginal boric acid.  Orders: -     Cervicovaginal ancillary only  Attention deficit hyperactivity disorder (ADHD), unspecified ADHD type  Prediabetes Assessment & Plan: Previously on metformin.  Patient politely declines referral to [facility] as she had previously followed with them.  We discussed a retrial of metformin and I encouraged her to maximize the dose, slowly, to 2000 mg every day.   Orders: -     POCT glycosylated hemoglobin (Hb A1C) -     metFORMIN HCl ER; Take 1 tablet (500 mg total) by mouth every evening.  Dispense: 90 tablet; Refill: 2  Chronic nonintractable headache, unspecified headache type Assessment & Plan: Resolved. Will monitor.       Return precautions given.   Risks, benefits, and alternatives of the medications and treatment plan prescribed today were discussed, and patient expressed understanding.   Education regarding symptom management and diagnosis given to patient on AVS either electronically or printed.  Return in about 3 months (around 12/13/2023).  Rennie Plowman, FNP  Subjective:    Patient ID: Cheryl Galvan, female    DOB: 02-08-1991, 32 y.o.   MRN: [id number]  CC: Cheryl Galvan is a 32 y.o. female who presents today for follow up.   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   She is now taking Vyvanse 60mg  at [facility]  H/o GDM  Previously she had been on metformin  Allergies: Patient has no known allergies. Current Outpatient Medications on File Prior to Visit  Medication Sig Dispense Refill   VYVANSE 60 MG capsule Take 60 mg by mouth daily.     No current facility-administered medications on file prior to visit.    Review of Systems  Constitutional:  Negative for chills and fever.  Respiratory:  Negative for cough.   Cardiovascular:  Negative for chest pain and palpitations.  Gastrointestinal:  Negative for nausea and vomiting.  Genitourinary:  Positive for dysuria and vaginal discharge. Negative for hematuria.      Objective:    BP 124/84   Pulse 84   Temp 97.8 F (36.6 C) (Oral)   Ht 5\' 6"  (1.676 m)   Wt 199 lb 6.4 oz (90.4 kg)   LMP  (LMP Unknown)   SpO2 99%   BMI 32.18 kg/m  BP Readings from Last 3 Encounters:  09/12/23 124/84  07/28/23 120/80  07/15/23 118/82   Wt Readings from Last 3 Encounters:  09/12/23 199 lb 6.4 oz (90.4 kg)  07/28/23 202 lb 9.6 oz (91.9 kg)  07/15/23 200 lb 9.6 oz (91 kg)    Physical Exam Vitals reviewed.  Constitutional:      Appearance: She is well-developed.  Eyes:     Conjunctiva/sclera: Conjunctivae normal.  Cardiovascular:     Rate and Rhythm: Normal rate and regular rhythm.     Pulses: Normal pulses.     Heart sounds: Normal heart sounds.  Pulmonary:     Effort: Pulmonary effort is normal.     Breath sounds: Normal breath sounds. No wheezing, rhonchi or rales.  Skin:    General: Skin is warm and dry.  Neurological:     Mental Status: She is alert.  Psychiatric:        Speech: Speech normal.        Behavior: Behavior normal.        Thought Content: Thought content normal.

## 2023-09-12 NOTE — Assessment & Plan Note (Signed)
Previously on metformin.  Patient politely declines referral to [facility] as she had previously followed with them.  We discussed a retrial of metformin and I encouraged her to maximize the dose, slowly, to 2000 mg every day.

## 2023-09-12 NOTE — Assessment & Plan Note (Addendum)
Afebrile.  Pending wet prep, urine studies.  Urinalysis with trace leukocytes.  Patient in agreement with waiting for urine culture, wet prep prior to starting any treatment. Of note, recurrent bacterial vaginosis.  Consider vaginal boric acid.

## 2023-09-12 NOTE — Assessment & Plan Note (Signed)
Resolved Will monitor 

## 2023-09-12 NOTE — Addendum Note (Signed)
Addended by: [place], Jimmye Wisnieski on: 09/12/2023 03:28 PM   Modules accepted: Orders

## 2023-09-12 NOTE — Patient Instructions (Signed)
Metformin is used in prediabetes, diabetes, and also for weight loss by decreasing calorie consumption.   It works in a couple of ways by decreasing liver glucose production, decreases intestinal absorption of glucose and improves insulin sensitivity (increases peripheral glucose uptake and utilization).    Please make sure that you titrate per below so not to cause any GI upset.    Start metformin XR with one 500mg  tablet at night. After one week, you may increase to two tablets at night ( total of 1000mg ) . The third week, you may take take two tablets at night and one tablet in the morning.  The fourth week, you may take two tablets in the morning ( 1000mg  total) and two tablets at night (1000mg  total). This will bring you to a maximum daily dose of 2000mg /day which is maximum dose.  So you are aware,  you may take ALL 4 tablets of metformin together at the same time if preferable and doesn't cause GI upset. You may take metformin 2000mg  ( four of the 500mg  tablets) together in the morning or at night if you prefer.   Along the way, if you want to increase more slowly, please do as this medication can cause GI discomfort and loose stools which usually get better with time , however some patients find that they can only tolerate a certain dose and cannot increase to maximum dose.    Nice to see you!

## 2023-09-14 LAB — CERVICOVAGINAL ANCILLARY ONLY
Bacterial Vaginitis (gardnerella): NEGATIVE
Candida Glabrata: NEGATIVE
Candida Vaginitis: NEGATIVE
Chlamydia: NEGATIVE
Comment: NEGATIVE
Comment: NEGATIVE
Comment: NEGATIVE
Comment: NEGATIVE
Comment: NEGATIVE
Comment: NORMAL
Neisseria Gonorrhea: NEGATIVE
Trichomonas: NEGATIVE

## 2023-09-15 LAB — URINE CULTURE
MICRO NUMBER:: 15560817
SPECIMEN QUALITY:: ADEQUATE

## 2023-09-16 ENCOUNTER — Other Ambulatory Visit: Payer: Self-pay | Admitting: Family

## 2023-09-16 DIAGNOSIS — N3000 Acute cystitis without hematuria: Secondary | ICD-10-CM

## 2023-09-16 MED ORDER — CEFDINIR 300 MG PO CAPS
300.0000 mg | ORAL_CAPSULE | Freq: Two times a day (BID) | ORAL | 0 refills | Status: AC
Start: 2023-09-16 — End: 2023-09-23

## 2023-09-20 DIAGNOSIS — F411 Generalized anxiety disorder: Secondary | ICD-10-CM | POA: Diagnosis not present

## 2023-09-20 DIAGNOSIS — Z600 Problems of adjustment to life-cycle transitions: Secondary | ICD-10-CM | POA: Diagnosis not present

## 2023-10-18 ENCOUNTER — Encounter: Payer: Self-pay | Admitting: Family

## 2023-10-18 ENCOUNTER — Ambulatory Visit: Payer: BC Managed Care – PPO | Admitting: Family

## 2023-10-18 VITALS — BP 124/82 | HR 96 | Temp 97.7°F | Ht 66.0 in | Wt 195.2 lb

## 2023-10-18 DIAGNOSIS — J019 Acute sinusitis, unspecified: Secondary | ICD-10-CM | POA: Diagnosis not present

## 2023-10-18 DIAGNOSIS — B9689 Other specified bacterial agents as the cause of diseases classified elsewhere: Secondary | ICD-10-CM

## 2023-10-18 MED ORDER — PREDNISONE 20 MG PO TABS: 40.0000 mg | ORAL_TABLET | Freq: Every day | ORAL | 0 refills | Status: DC

## 2023-10-18 MED ORDER — AMOXICILLIN-POT CLAVULANATE 875-125 MG PO TABS: 1.0000 | ORAL_TABLET | Freq: Two times a day (BID) | ORAL | 0 refills | Status: DC

## 2023-10-18 NOTE — Progress Notes (Signed)
Acute Office Visit  Subjective:     Patient ID: Cheryl Galvan, female    DOB: 05/01/1991, 32 y.o.   MRN: [id number]  Chief Complaint  Patient presents with  . Acute Visit    Ear ache    HPI Patient is in today with c/o cough, sinus pressure, congestion, x 10 days and worsening. Her children have bene battling previous illnesses for the last 6 weeks. She too, was treated recently with Prednisone and antibiotics. Baby now has PNA.   Review of Systems  Constitutional:  Negative for chills and fever.  HENT:  Positive for congestion, ear pain and sinus pain.   Respiratory:  Positive for cough.   Cardiovascular: Negative.   Gastrointestinal: Negative.   Musculoskeletal: Negative.   Neurological: Negative.   Psychiatric/Behavioral: Negative.    All other systems reviewed and are negative. Past Medical History:  Diagnosis Date  . Acne   . Allergy    sesonal allergy   . Amblyopia   . Anemia   . Anxiety 2009  . Asthma     no recent difficulties inhaler not needed for years  . Depression    meds years ago  . Gestational diabetes   . History of kidney stones   . History of migraine    none in years  . [facility]    Social History   Socioeconomic History  . Marital status: Married    Spouse name: Not on file  . Number of children: 0  . Years of education: Assc.   . Highest education level: Not on file  Occupational History    Employer: OTHER    Comment: [employer]  Tobacco Use  . Smoking status: Never  . Smokeless tobacco: Never  Vaping Use  . Vaping status: Never Used  Substance and Sexual Activity  . Alcohol use: Not Currently  . Drug use: No  . Sexual activity: Yes    Birth control/protection: None  Other Topics Concern  . Not on file  Social History Narrative         She staying home with kids.       3 kids, 4 yrs ( boy), 2 years ( boy)and 74 month old ( girl).       Husband has vasectomy.     Social Determinants of Health   Financial Resource Strain: Not on file  Food Insecurity: No Food Insecurity (12/27/2022)   Hunger Vital Sign   . Worried About Programme researcher, broadcasting/film/video in the Last Year: Never true   . Ran Out of Food in the Last Year: Never true  Transportation Needs: No Transportation Needs (12/27/2022)   PRAPARE - Transportation   . Lack of Transportation (Medical): No   . Lack of Transportation (Non-Medical): No  Physical Activity: Not on file  Stress: Not on file  Social Connections: Not on file  Intimate Partner Violence: Unknown (12/27/2022)   Humiliation, Afraid, Rape, and Kick questionnaire   . Fear of Current or Ex-Partner: No   . Emotionally Abused: No   . Physically Abused: Not on file   . Sexually Abused: Not on file    Past Surgical History:  Procedure Laterality Date  . CYSTOSCOPY WITH STENT PLACEMENT Right 06/29/2020   Procedure: CYSTOSCOPY WITH STENT PLACEMENT;  Surgeon: Crist Fat, MD;  Location: [facility];  Service: Urology;  Laterality: Right;  . CYSTOSCOPY/URETEROSCOPY/HOLMIUM LASER/STENT PLACEMENT Right 08/08/2020   Procedure: RIGHT URETEROSCOPY//STENT EXCHANGE/;  Surgeon: Crist Fat, MD;  Location: [facility];  Service: Urology;  Laterality: Right;  . WISDOM TOOTH EXTRACTION  yrs ago    Family History  Problem Relation Age of Onset  . Hypertension Mother   . Allergic rhinitis Mother   . Anxiety disorder Mother   . Depression Mother   . Alcohol abuse Father   . Drug abuse Father   . Allergic rhinitis Sister   . Cancer Maternal Grandmother 14       colon  . Stroke Maternal Grandmother   . Diabetes Maternal Grandmother   . Hypertension Maternal Grandmother   . Cancer Maternal Grandfather 50       lung cancer  . Stroke Maternal Grandfather   . Cancer Paternal Grandfather        prostate  . Heart disease Other   . Angioedema Neg Hx   . Eczema Neg Hx   . Immunodeficiency Neg Hx   . Urticaria Neg Hx      No Known Allergies  Current Outpatient Medications on File Prior to Visit  Medication Sig Dispense Refill  . metFORMIN (GLUCOPHAGE-XR) 500 MG 24 hr tablet Take 1 tablet (500 mg total) by mouth every evening. 90 tablet 2  . VYVANSE 60 MG capsule Take 60 mg by mouth daily.     No current facility-administered medications on file prior to visit.    BP 124/82   Pulse 96   Temp 97.7 F (36.5 C)   Ht 5\' 6"  (1.676 m)   Wt 195 lb 3.2 oz (88.5 kg)   SpO2 99%   BMI 31.51 kg/m chart      Objective:    BP 124/82   Pulse 96   Temp 97.7 F (36.5 C)   Ht 5\' 6"  (1.676 m)   Wt 195 lb 3.2 oz (88.5 kg)   SpO2 99%   BMI 31.51 kg/m    Physical Exam Vitals reviewed.  Constitutional:      Appearance: Normal appearance.  HENT:     Head:     Comments: Moderate fluid in the right ear.    Right Ear: Tympanic membrane, ear canal and external ear normal.     Left Ear: Tympanic membrane, ear canal and external ear normal.     Nose: Congestion present.     Comments: Maxillary sinus tenderness to palpation Cardiovascular:     Rate and Rhythm: Normal rate and regular rhythm.     Pulses: Normal pulses.     Heart sounds: Normal heart sounds.  Pulmonary:     Effort: Pulmonary effort is normal.     Breath sounds: Normal breath sounds.  Musculoskeletal:        General: Normal range of motion.     Cervical back: Normal range of motion and neck supple.  Skin:    General: Skin is warm and dry.  Neurological:     General: No focal deficit present.     Mental Status: She is alert and oriented to person, place, and time.  Psychiatric:        Mood and Affect: Mood normal.        Behavior: Behavior normal.        Thought Content: Thought content normal.   No results found for any visits on 10/18/23.      Assessment & Plan:   Problem List Items Addressed This Visit   None Visit Diagnoses     Acute bacterial sinusitis    -  Primary  Relevant Medications   predniSONE (DELTASONE) 20  MG tablet   amoxicillin-clavulanate (AUGMENTIN) 875-125 MG tablet       Meds ordered this encounter  Medications  . predniSONE (DELTASONE) 20 MG tablet    Sig: Take 2 tablets (40 mg total) by mouth daily with breakfast.    Dispense:  10 tablet    Refill:  0  . amoxicillin-clavulanate (AUGMENTIN) 875-125 MG tablet    Sig: Take 1 tablet by mouth 2 (two) times daily.    Dispense:  20 tablet    Refill:  0   Call the office if symptoms worsen or persist. Recheck as scheduled and sooner as needed. No follow-ups on file.  Eulis Foster, FNP

## 2023-10-26 DIAGNOSIS — Z600 Problems of adjustment to life-cycle transitions: Secondary | ICD-10-CM | POA: Diagnosis not present

## 2023-10-26 DIAGNOSIS — F411 Generalized anxiety disorder: Secondary | ICD-10-CM | POA: Diagnosis not present

## 2023-11-01 DIAGNOSIS — F902 Attention-deficit hyperactivity disorder, combined type: Secondary | ICD-10-CM | POA: Diagnosis not present

## 2023-11-01 DIAGNOSIS — Z79899 Other long term (current) drug therapy: Secondary | ICD-10-CM | POA: Diagnosis not present

## 2023-11-09 DIAGNOSIS — Z600 Problems of adjustment to life-cycle transitions: Secondary | ICD-10-CM | POA: Diagnosis not present

## 2023-11-09 DIAGNOSIS — F411 Generalized anxiety disorder: Secondary | ICD-10-CM | POA: Diagnosis not present

## 2023-11-14 DIAGNOSIS — F411 Generalized anxiety disorder: Secondary | ICD-10-CM | POA: Diagnosis not present

## 2023-11-14 DIAGNOSIS — Z600 Problems of adjustment to life-cycle transitions: Secondary | ICD-10-CM | POA: Diagnosis not present

## 2023-11-16 DIAGNOSIS — F411 Generalized anxiety disorder: Secondary | ICD-10-CM | POA: Diagnosis not present

## 2023-11-16 DIAGNOSIS — Z600 Problems of adjustment to life-cycle transitions: Secondary | ICD-10-CM | POA: Diagnosis not present

## 2023-11-23 DIAGNOSIS — Z600 Problems of adjustment to life-cycle transitions: Secondary | ICD-10-CM | POA: Diagnosis not present

## 2023-11-23 DIAGNOSIS — F411 Generalized anxiety disorder: Secondary | ICD-10-CM | POA: Diagnosis not present

## 2023-11-29 DIAGNOSIS — F411 Generalized anxiety disorder: Secondary | ICD-10-CM | POA: Diagnosis not present

## 2023-11-29 DIAGNOSIS — Z600 Problems of adjustment to life-cycle transitions: Secondary | ICD-10-CM | POA: Diagnosis not present

## 2023-12-01 ENCOUNTER — Telehealth: Payer: Self-pay

## 2023-12-01 ENCOUNTER — Other Ambulatory Visit: Payer: Self-pay

## 2023-12-01 DIAGNOSIS — R7303 Prediabetes: Secondary | ICD-10-CM

## 2023-12-01 MED ORDER — METFORMIN HCL ER 500 MG PO TB24
500.0000 mg | ORAL_TABLET | Freq: Three times a day (TID) | ORAL | 1 refills | Status: DC
Start: 2023-12-01 — End: 2024-10-22

## 2023-12-01 NOTE — Telephone Encounter (Signed)
Per margaret's note in October, patient was advised to increase slowly to 2000 mg q day. She is taking 500 mg TID. OK to send in rx with new directions? She is out of medication

## 2023-12-01 NOTE — Telephone Encounter (Signed)
Copied from CRM [id number]. Topic: Clinical - Prescription Issue >> Dec 01, 2023 11:08 AM Orinda Kenner C wrote: Reason for CRM: MetFORMIN (GLUCOPHAGE-XR) 500 MG 24 hr tablet has been taking TID since October, [facility] told pt she cannot refill until mid January 2025 bc of the instructions was once daily. Pt does not have any meds left, and does not want to get the side effects of not having meds. Pls advise and contact via MyChart today.

## 2023-12-06 DIAGNOSIS — F411 Generalized anxiety disorder: Secondary | ICD-10-CM | POA: Diagnosis not present

## 2023-12-06 DIAGNOSIS — Z600 Problems of adjustment to life-cycle transitions: Secondary | ICD-10-CM | POA: Diagnosis not present

## 2023-12-13 ENCOUNTER — Ambulatory Visit: Payer: BC Managed Care – PPO | Admitting: Family

## 2023-12-15 ENCOUNTER — Ambulatory Visit: Payer: BC Managed Care – PPO | Admitting: Family

## 2024-01-02 ENCOUNTER — Other Ambulatory Visit: Payer: Self-pay

## 2024-01-02 MED ORDER — LISDEXAMFETAMINE DIMESYLATE 60 MG PO CAPS
60.0000 mg | ORAL_CAPSULE | Freq: Every day | ORAL | 0 refills | Status: AC
  Filled 2024-01-02: qty 30, 30d supply, fill #0

## 2024-10-09 ENCOUNTER — Encounter: Payer: Self-pay | Admitting: Family

## 2024-10-22 ENCOUNTER — Telehealth: Payer: Self-pay | Admitting: Family

## 2024-10-22 ENCOUNTER — Ambulatory Visit (INDEPENDENT_AMBULATORY_CARE_PROVIDER_SITE_OTHER): Payer: Self-pay | Admitting: Family

## 2024-10-22 ENCOUNTER — Encounter: Payer: Self-pay | Admitting: Family

## 2024-10-22 VITALS — BP 102/60 | HR 106 | Temp 97.7°F | Ht 66.0 in | Wt 169.2 lb

## 2024-10-22 DIAGNOSIS — E663 Overweight: Secondary | ICD-10-CM

## 2024-10-22 DIAGNOSIS — N644 Mastodynia: Secondary | ICD-10-CM

## 2024-10-22 DIAGNOSIS — Z0000 Encounter for general adult medical examination without abnormal findings: Secondary | ICD-10-CM

## 2024-10-22 DIAGNOSIS — R7303 Prediabetes: Secondary | ICD-10-CM

## 2024-10-22 DIAGNOSIS — F909 Attention-deficit hyperactivity disorder, unspecified type: Secondary | ICD-10-CM

## 2024-10-22 DIAGNOSIS — K6289 Other specified diseases of anus and rectum: Secondary | ICD-10-CM

## 2024-10-22 MED ORDER — METFORMIN HCL ER 500 MG PO TB24
500.0000 mg | ORAL_TABLET | Freq: Two times a day (BID) | ORAL | 3 refills | Status: AC
Start: 2024-10-22 — End: ?

## 2024-10-22 NOTE — Patient Instructions (Addendum)
 I would recommend A1c, lipid panel, CBC with differential and CMP labs  Please send me Gastroenterology report from 2022 and colonoscopy.   If rectal sensation persists, I would advise re evaluation of rectal pain. Please let me know if you need new referral.   Please call  and schedule your 3D mammogram and /or bone density scan as we discussed.   [facility]  ( new location in 2023)  [address], [place], [place] [address]  [place], [place]  [phone]    Health Maintenance, Female Adopting a healthy lifestyle and getting preventive care are important in promoting health and wellness. Ask your health care provider about: The right schedule for you to have regular tests and exams. Things you can do on your own to prevent diseases and keep yourself healthy. What should I know about diet, weight, and exercise? Eat a healthy diet  Eat a diet that includes plenty of vegetables, fruits, low-fat dairy products, and lean protein. Do not eat a lot of foods that are high in solid fats, added sugars, or sodium. Maintain a healthy weight Body mass index (BMI) is used to identify weight problems. It estimates body fat based on height and weight. Your health care provider can help determine your BMI and help you achieve or maintain a healthy weight. Get regular exercise Get regular exercise. This is one of the most important things you can do for your health. Most adults should: Exercise for at least 150 minutes each week. The exercise should increase your heart rate and make you sweat (moderate-intensity exercise). Do strengthening exercises at least twice a week. This is in addition to the moderate-intensity exercise. Spend less time sitting. Even light physical activity can be beneficial. Watch cholesterol and blood lipids Have your blood tested for lipids and cholesterol at 33 years of age, then have this test every 5 years. Have your cholesterol levels checked  more often if: Your lipid or cholesterol levels are high. You are older than 33 years of age. You are at high risk for heart disease. What should I know about cancer screening? Depending on your health history and family history, you may need to have cancer screening at various ages. This may include screening for: Breast cancer. Cervical cancer. Colorectal cancer. Skin cancer. Lung cancer. What should I know about heart disease, diabetes, and high blood pressure? Blood pressure and heart disease High blood pressure causes heart disease and increases the risk of stroke. This is more likely to develop in people who have high blood pressure readings or are overweight. Have your blood pressure checked: Every 3-5 years if you are 71-23 years of age. Every year if you are 29 years old or older. Diabetes Have regular diabetes screenings. This checks your fasting blood sugar level. Have the screening done: Once every three years after age 85 if you are at a normal weight and have a low risk for diabetes. More often and at a younger age if you are overweight or have a high risk for diabetes. What should I know about preventing infection? Hepatitis B If you have a higher risk for hepatitis B, you should be screened for this virus. Talk with your health care provider to find out if you are at risk for hepatitis B infection. Hepatitis C Testing is recommended for: Everyone born from [birth year] through 1965. Anyone with known risk factors for hepatitis C. Sexually transmitted infections (STIs) Get screened for STIs, including gonorrhea and chlamydia, if: You are  sexually active and are younger than 33 years of age. You are older than 33 years of age and your health care provider tells you that you are at risk for this type of infection. Your sexual activity has changed since you were last screened, and you are at increased risk for chlamydia or gonorrhea. Ask your health care provider if you are at  risk. Ask your health care provider about whether you are at high risk for HIV. Your health care provider may recommend a prescription medicine to help prevent HIV infection. If you choose to take medicine to prevent HIV, you should first get tested for HIV. You should then be tested every 3 months for as long as you are taking the medicine. Pregnancy If you are about to stop having your period (premenopausal) and you may become pregnant, seek counseling before you get pregnant. Take 400 to 800 micrograms (mcg) of folic acid  every day if you become pregnant. Ask for birth control (contraception) if you want to prevent pregnancy. Osteoporosis and menopause Osteoporosis is a disease in which the bones lose minerals and strength with aging. This can result in bone fractures. If you are 71 years old or older, or if you are at risk for osteoporosis and fractures, ask your health care provider if you should: Be screened for bone loss. Take a calcium  or vitamin D  supplement to lower your risk of fractures. Be given hormone replacement therapy (HRT) to treat symptoms of menopause. Follow these instructions at home: Alcohol use Do not drink alcohol if: Your health care provider tells you not to drink. You are pregnant, may be pregnant, or are planning to become pregnant. If you drink alcohol: Limit how much you have to: 0-1 drink a day. Know how much alcohol is in your drink. In the [place] one drink equals one 12 oz bottle of beer (355 mL), one 5 oz glass of wine (148 mL), or one 1 oz glass of hard liquor (44 mL). Lifestyle Do not use any products that contain nicotine or tobacco. These products include cigarettes, chewing tobacco, and vaping devices, such as e-cigarettes. If you need help quitting, ask your health care provider. Do not use street drugs. Do not share needles. Ask your health care provider for help if you need support or information about quitting drugs. General instructions Schedule  regular health, dental, and eye exams. Stay current with your vaccines. Tell your health care provider if: You often feel depressed. You have ever been abused or do not feel safe at home. Summary Adopting a healthy lifestyle and getting preventive care are important in promoting health and wellness. Follow your health care provider's instructions about healthy diet, exercising, and getting tested or screened for diseases. Follow your health care provider's instructions on monitoring your cholesterol and blood pressure. This information is not intended to replace advice given to you by your health care provider. Make sure you discuss any questions you have with your health care provider. Document Revised: 04/13/2021 Document Reviewed: 04/13/2021 Elsevier Patient Education  2024 [employer].

## 2024-10-22 NOTE — Assessment & Plan Note (Signed)
 No mass on exam.  Pending diagnostic images

## 2024-10-22 NOTE — Assessment & Plan Note (Signed)
 Visually no abnormalities, hemorrhoids.  Unable to see GI workup from 2022 as reported in [place].  Advised patient to send over records, colonoscopy of this.  If rectal pain persists, I have strongly advised her to follow-up with gastroenterology for further evaluation.  She stated that she will let me know if she needs a formal referral

## 2024-10-22 NOTE — Progress Notes (Unsigned)
 Assessment & Plan:  There are no diagnoses linked to this encounter.   Return precautions given.   Risks, benefits, and alternatives of the medications and treatment plan prescribed today were discussed, and patient expressed understanding.   Education regarding symptom management and diagnosis given to patient on AVS either electronically or printed.  No follow-ups on file.  Rollene Northern, FNP  Subjective:    Patient ID: Cheryl Galvan, female    DOB: [birth year], 33 y.o.   MRN: [id number]  CC: Cheryl Galvan is a 33 y.o. female who presents today for physical exam.    HPI: She complains of right breast pain, can be shooting.  She cannot feel a mass.  No dipple discharge.  Complains of rectal sensation H/o hemorrhoid.  No straining, blood in stool. Stool is not marroon in color.     No early FDR family h/o breast or colon cancer  Cervical Cancer Screening: appears due; following with [facility]; she will call to schedule follow up. She had a follow up in 02/2024, deferred pap at that time.          Tetanus - UTD         Exercise: Gets regular exercise with [employer] 5 days per week.   Alcohol use:  none Smoking/tobacco use: Nonsmoker.    Health Maintenance  Topic Date Due   Pneumococcal Vaccine (1 of 2 - PCV) Never done   HPV Vaccine (1 - 3-dose SCDM series) Never done   Pap with HPV screening  Never done   Flu Shot  07/06/2024   COVID-19 Vaccine (1) 11/07/2024*   DTaP/Tdap/Td vaccine (5 - Td or Tdap) 12/28/2032   Hepatitis B Vaccine  Completed   Hepatitis C Screening  Completed   HIV Screening  Completed   Meningitis B Vaccine  Aged Out  *Topic was postponed. The date shown is not the original due date.    ALLERGIES: Patient has no known allergies.  Current Outpatient Medications on File Prior to Visit  Medication Sig Dispense Refill   lisdexamfetamine (VYVANSE ) 60 MG capsule Take 1 capsule by mouth daily 30 capsule 0   metFORMIN  (GLUCOPHAGE -XR) 500  MG 24 hr tablet Take 1 tablet (500 mg total) by mouth 3 (three) times daily. 270 tablet 1   VYVANSE  60 MG capsule Take 60 mg by mouth daily.     amoxicillin -clavulanate (AUGMENTIN ) 875-125 MG tablet Take 1 tablet by mouth 2 (two) times daily. (Patient not taking: Reported on 10/22/2024) 20 tablet 0   predniSONE  (DELTASONE ) 20 MG tablet Take 2 tablets (40 mg total) by mouth daily with breakfast. (Patient not taking: Reported on 10/22/2024) 10 tablet 0   No current facility-administered medications on file prior to visit.    Review of Systems  Constitutional:  Negative for chills and fever.  Respiratory:  Negative for cough.   Cardiovascular:  Negative for chest pain and palpitations.  Gastrointestinal:  Negative for nausea and vomiting.      Objective:    BP 102/60   Pulse (!) 106   Temp 97.7 F (36.5 C) (Oral)   Ht 5' 6 (1.676 m)   Wt 169 lb 3.2 oz (76.7 kg)   SpO2 98%   BMI 27.31 kg/m   BP Readings from Last 3 Encounters:  10/22/24 102/60  10/18/23 124/82  09/12/23 124/84   Wt Readings from Last 3 Encounters:  10/22/24 169 lb 3.2 oz (76.7 kg)  10/18/23 195 lb 3.2 oz (88.5 kg)  09/12/23 199 lb  6.4 oz (90.4 kg)    Physical Exam Vitals reviewed.  Constitutional:      Appearance: Normal appearance. She is well-developed.  Eyes:     Conjunctiva/sclera: Conjunctivae normal.  Neck:     Thyroid : No thyroid  mass or thyromegaly.  Cardiovascular:     Rate and Rhythm: Normal rate and regular rhythm.     Pulses: Normal pulses.     Heart sounds: Normal heart sounds.  Pulmonary:     Effort: Pulmonary effort is normal.     Breath sounds: Normal breath sounds. No wheezing, rhonchi or rales.  Chest:  Breasts:    Breasts are symmetrical.     Right: No inverted nipple, mass, nipple discharge, skin change or tenderness.     Left: No inverted nipple, mass, nipple discharge, skin change or tenderness.  Abdominal:     General: Bowel sounds are normal. There is no distension.      Palpations: Abdomen is soft. Abdomen is not rigid. There is no fluid wave or mass.     Tenderness: There is no abdominal tenderness. There is no guarding or rebound.  Lymphadenopathy:     Head:     Right side of head: No submental, submandibular, tonsillar, preauricular, posterior auricular or occipital adenopathy.     Left side of head: No submental, submandibular, tonsillar, preauricular, posterior auricular or occipital adenopathy.     Cervical: No cervical adenopathy.     Right cervical: No superficial, deep or posterior cervical adenopathy.    Left cervical: No superficial, deep or posterior cervical adenopathy.  Skin:    General: Skin is warm and dry.  Neurological:     Mental Status: She is alert.  Psychiatric:        Speech: Speech normal.        Behavior: Behavior normal.        Thought Content: Thought content normal.

## 2024-10-23 NOTE — Assessment & Plan Note (Signed)
 Clinical breast exam performed.  Provided patient on diligence to exercise.  Requesting colonoscopy.  Deferred pelvic exam in the absence of complaints and she is following with GYN

## 2024-10-23 NOTE — Assessment & Plan Note (Signed)
 Congratulated patient on weight loss.  Advised that she may continue metformin  500 mg twice daily for maintenance.

## 2024-10-23 NOTE — Telephone Encounter (Signed)
 close

## 2024-12-27 ENCOUNTER — Ambulatory Visit
Admission: RE | Admit: 2024-12-27 | Discharge: 2024-12-27 | Disposition: A | Discharge status: Home / Self Care | Payer: Self-pay | Source: Ambulatory Visit | Attending: Family | Admitting: Family

## 2024-12-27 DIAGNOSIS — N644 Mastodynia: Secondary | ICD-10-CM
